# Patient Record
Sex: Female | Born: 1965
Health system: Southern US, Community
[De-identification: ages and names within clinical notes are randomized; demographics above are authoritative.]

## PROBLEM LIST (undated history)

## (undated) DIAGNOSIS — M1612 Unilateral primary osteoarthritis, left hip: Secondary | ICD-10-CM

## (undated) DIAGNOSIS — K5792 Diverticulitis of intestine, part unspecified, without perforation or abscess without bleeding: Secondary | ICD-10-CM

## (undated) DIAGNOSIS — I10 Essential (primary) hypertension: Secondary | ICD-10-CM

## (undated) DIAGNOSIS — E559 Vitamin D deficiency, unspecified: Secondary | ICD-10-CM

## (undated) DIAGNOSIS — F329 Major depressive disorder, single episode, unspecified: Secondary | ICD-10-CM

## (undated) DIAGNOSIS — K829 Disease of gallbladder, unspecified: Secondary | ICD-10-CM

## (undated) DIAGNOSIS — E739 Lactose intolerance, unspecified: Secondary | ICD-10-CM

## (undated) DIAGNOSIS — I82409 Acute embolism and thrombosis of unspecified deep veins of unspecified lower extremity: Secondary | ICD-10-CM

## (undated) DIAGNOSIS — I280 Arteriovenous fistula of pulmonary vessels: Secondary | ICD-10-CM

## (undated) DIAGNOSIS — E662 Morbid (severe) obesity with alveolar hypoventilation: Secondary | ICD-10-CM

## (undated) DIAGNOSIS — E119 Type 2 diabetes mellitus without complications: Secondary | ICD-10-CM

## (undated) DIAGNOSIS — F32A Depression, unspecified: Secondary | ICD-10-CM

## (undated) DIAGNOSIS — N83209 Unspecified ovarian cyst, unspecified side: Secondary | ICD-10-CM

## (undated) DIAGNOSIS — K429 Umbilical hernia without obstruction or gangrene: Secondary | ICD-10-CM

## (undated) DIAGNOSIS — D3500 Benign neoplasm of unspecified adrenal gland: Secondary | ICD-10-CM

## (undated) DIAGNOSIS — M5416 Radiculopathy, lumbar region: Secondary | ICD-10-CM

## (undated) DIAGNOSIS — Z780 Asymptomatic menopausal state: Secondary | ICD-10-CM

## (undated) DIAGNOSIS — M4316 Spondylolisthesis, lumbar region: Secondary | ICD-10-CM

## (undated) DIAGNOSIS — Q2730 Arteriovenous malformation, site unspecified: Secondary | ICD-10-CM

## (undated) DIAGNOSIS — K760 Fatty (change of) liver, not elsewhere classified: Secondary | ICD-10-CM

## (undated) DIAGNOSIS — G4730 Sleep apnea, unspecified: Secondary | ICD-10-CM

## (undated) DIAGNOSIS — E66813 Obesity, class 3: Secondary | ICD-10-CM

## (undated) DIAGNOSIS — N9412 Deep dyspareunia: Secondary | ICD-10-CM

## (undated) DIAGNOSIS — D649 Anemia, unspecified: Secondary | ICD-10-CM

## (undated) DIAGNOSIS — R7303 Prediabetes: Secondary | ICD-10-CM

## (undated) DIAGNOSIS — G8929 Other chronic pain: Secondary | ICD-10-CM

## (undated) DIAGNOSIS — D569 Thalassemia, unspecified: Secondary | ICD-10-CM

## (undated) DIAGNOSIS — Z6841 Body Mass Index (BMI) 40.0 and over, adult: Secondary | ICD-10-CM

## (undated) HISTORY — PX: LEEP: SHX91

## (undated) HISTORY — DX: Arteriovenous malformation, site unspecified: Q27.30

## (undated) HISTORY — DX: Fatty (change of) liver, not elsewhere classified: K76.0

## (undated) HISTORY — DX: Vitamin D deficiency, unspecified: E55.9

## (undated) HISTORY — DX: Prediabetes: R73.03

## (undated) HISTORY — DX: Type 2 diabetes mellitus without complications: E11.9

## (undated) HISTORY — DX: Morbid (severe) obesity due to excess calories: E66.01

## (undated) HISTORY — DX: Lactose intolerance, unspecified: E73.9

## (undated) HISTORY — DX: Thalassemia, unspecified: D56.9

## (undated) HISTORY — DX: Diverticulitis of intestine, part unspecified, without perforation or abscess without bleeding: K57.92

## (undated) HISTORY — DX: Benign neoplasm of unspecified adrenal gland: D35.00

## (undated) HISTORY — DX: Disease of gallbladder, unspecified: K82.9

## (undated) HISTORY — PX: DILATION AND CURETTAGE OF UTERUS: SHX78

## (undated) HISTORY — DX: Acute embolism and thrombosis of unspecified deep veins of unspecified lower extremity: I82.409

## (undated) HISTORY — DX: Anemia, unspecified: D64.9

## (undated) HISTORY — PX: ABDOMINAL HYSTERECTOMY: SHX81

## (undated) HISTORY — PX: APPENDECTOMY: SHX54

## (undated) HISTORY — PX: TUBAL LIGATION: SHX77

## (undated) HISTORY — DX: Depression, unspecified: F32.A

## (undated) HISTORY — PX: CHOLECYSTECTOMY: SHX55

## (undated) NOTE — *Deleted (*Deleted)
[facility]   [phone] [phone]   Clinic Follow up Note   Patient Care Team: Laqueta Due., MD as PCP - General (Internal Medicine) Rachael Fee, MD as Attending Physician (Gastroenterology) Zelphia Cairo, MD as Consulting Physician (Obstetrics and Gynecology) Rene Paci, MD as Consulting Physician (Urology) Helane Rima, DO (Family Medicine)  Date of Service:  04/11/2020  CHIEF COMPLAINT: F/u of High Ferritin   CURRENT THERAPY:  ***  INTERVAL HISTORY: *** Diana Schultz is here for a follow up. She was last seen by me 6 months ago. She presents to the clinic alone.    REVIEW OF SYSTEMS:  *** Constitutional: Denies fevers, chills or abnormal weight loss Eyes: Denies blurriness of vision Ears, nose, mouth, throat, and face: Denies mucositis or sore throat Respiratory: Denies cough, dyspnea or wheezes Cardiovascular: Denies palpitation, chest discomfort or lower extremity swelling Gastrointestinal:  Denies nausea, heartburn or change in bowel habits Skin: Denies abnormal skin rashes Lymphatics: Denies new lymphadenopathy or easy bruising Neurological:Denies numbness, tingling or new weaknesses Behavioral/Psych: Mood is stable, no new changes  All other systems were reviewed with the patient and are negative.  MEDICAL HISTORY:  Past Medical History:  Diagnosis Date  . Arteriovenous malformation   . Depression   . Diabetes mellitus without complication (HCC)    borderline - no meds  . Diverticulitis   . DVT (deep venous thrombosis) (HCC)    Hx at age 73 blood clot in right leg, d/c birth control pills and no other problems  . Fatty liver   . Gallbladder disease   . Hypertension    history,lost weight, controlled with diet and exercise, no current med  . Lactose intolerance   . Ovarian cyst   . Thalassemia   . Umbilical hernia   . Vaginal delivery 1984, 1989  . Vitamin D deficiency     SURGICAL  HISTORY: Past Surgical History:  Procedure Laterality Date  . ABDOMINAL HYSTERECTOMY    . ABLATION  03/23/13   [facility]  . APPENDECTOMY    . CHOLECYSTECTOMY    . DILATION AND CURETTAGE OF UTERUS    . DILATION AND CURETTAGE OF UTERUS N/A 11/01/2013   Procedure: DILATATION AND CURETTAGE;  Surgeon: Catalina Antigua, MD;  Location: [facility];  Service: Gynecology;  Laterality: N/A;  . DILITATION & CURRETTAGE/HYSTROSCOPY WITH NOVASURE ABLATION N/A 03/23/2013   Procedure: DILATATION & CURETTAGE/HYSTEROSCOPY WITH NOVASURE ABLATION;  Surgeon: Willodean Rosenthal, MD;  Location: [facility];  Service: Gynecology;  Laterality: N/A;  . LEEP    . TUBAL LIGATION    . VAGINAL HYSTERECTOMY N/A 12/31/2013   Procedure: HYSTERECTOMY VAGINAL;  Surgeon: Willodean Rosenthal, MD;  Location: [facility];  Service: Gynecology;  Laterality: N/A;    I have reviewed the social history and family history with the patient and they are unchanged from previous note.  ALLERGIES:  is allergic to amlodipine, metoprolol, benicar [olmesartan], hydrochlorothiazide, lactose intolerance (gi), lisinopril, nebivolol, and semaglutide(0.25 or 0.5mg -dos).  MEDICATIONS:  Current Outpatient Medications  Medication Sig Dispense Refill  . acetaminophen (TYLENOL) 500 MG tablet Take 500 mg by mouth every 6 (six) hours as needed for moderate pain.    . Melatonin 2.5 MG CAPS Take 1 capsule by mouth at bedtime.     No current facility-administered medications for this visit.    PHYSICAL EXAMINATION: ECOG PERFORMANCE STATUS: {CHL ONC ECOG PS:[id number]}  There were no vitals filed for this visit. There were no vitals filed for this visit. ***  GENERAL:alert, no distress and comfortable SKIN: skin color, texture, turgor are normal, no rashes or significant lesions EYES: normal, Conjunctiva are pink and non-injected, sclera clear {OROPHARYNX:no exudate, no erythema and lips, buccal mucosa, and tongue normal}  NECK: supple, thyroid normal size,  non-tender, without nodularity LYMPH:  no palpable lymphadenopathy in the cervical, axillary {or inguinal} LUNGS: clear to auscultation and percussion with normal breathing effort HEART: regular rate & rhythm and no murmurs and no lower extremity edema ABDOMEN:abdomen soft, non-tender and normal bowel sounds Musculoskeletal:no cyanosis of digits and no clubbing  NEURO: alert & oriented x 3 with fluent speech, no focal motor/sensory deficits  LABORATORY DATA:  I have reviewed the data as listed CBC Latest Ref Rng & Units 09/11/2019 06/10/2019 12/15/2017  WBC 3.4 - 10.8 x10E3/uL 9.1 9.4 10.5  Hemoglobin 11.1 - 15.9 g/dL 16.1 09.6 04.5  Hematocrit 34.0 - 46.6 % 42.8 44.7 41.6  Platelets 150 - 450 x10E3/uL 242 233 238     CMP Latest Ref Rng & Units 09/11/2019 06/10/2019 12/15/2017  Glucose 65 - 99 mg/dL 409(W) 95 96  BUN 6 - 24 mg/dL 18 17 16   Creatinine 0.57 - 1.00 mg/dL 1.19 1.47 8.29  Sodium 134 - 144 mmol/L 138 139 142  Potassium 3.5 - 5.2 mmol/L 4.2 4.8 4.2  Chloride 96 - 106 mmol/L 101 106 108  CO2 20 - 29 mmol/L 24 25 26   Calcium 8.7 - 10.2 mg/dL 9.4 9.5 9.3  Total Protein 6.0 - 8.5 g/dL 7.3 - 7.6  Total Bilirubin 0.0 - 1.2 mg/dL 0.7 - 1.1  Alkaline Phos 39 - 117 IU/L 83 - 72  AST 0 - 40 IU/L 20 - 26  ALT 0 - 32 IU/L 22 - 22      RADIOGRAPHIC STUDIES: I have personally reviewed the radiological images as listed and agreed with the findings in the report. No results found.   ASSESSMENT & PLAN:  NA Diana Schultz is a 67 y.o. female with    1. Elevated Ferritin, Microcytosis  -Per pt she had a h/o anemia and was on oral iron pill many years ago. Her oral iron was later on d/c due to elevated levels.  -her CBC since 2013 showed persistent mild microcytosis, no anemia or other abnormal blood counts. -In 03/2018 at [facility] she had Ferritin at 267, MCV 76.9, MCH 24.2. There was suspicion for her being a Thalassemia Carrier. -Her more recent labs show Ferritin increased to 363 on  09/11/19 with MCV 77, MCH 25.  -I recommend genetic testing for thalassemia as she may be a carrier and can effect her children. She is agreeable. -I discussed ferritin level can reflect iron storage, but high level could also be related to chronic inflammation, such as rheumatological disorder or infection.  She has no obvious clinical presentation of rheumatological disorder or infection, I will check ANA and inflammation markers today.  -Given the normal serum iron and transferrin saturation, my suspicion of iron overload and hemochromatosis is low, I will check HFE gene mutation today.  Her CT abdomen and pelvis with contrast in June 2019 showed unremarkable liver and spleen.  I do not feel she needs liver MRI or biopsy at this point.  -She did have epigastric tenderness on exam today. She has been having Epigastric pain which is exacerbated by eating, will monitor   2. Obesity, Fatigue  -She has been working with Dr. Earlene Plater on weight management. She has been altering her diet and exercising without success. She is  considering medical options with Dr Earlene Plater -Her fatigue is significant. She notes it could be related to her weigh or her mood. Will monitor.    3. Comorbidities: DM, HTN, H/o right leg DVT.  -not currently on medications. She is working on controlling with diet and exercise with Dr Earlene Plater.    4. Adrenal Nodule  -Seen on 06/13/19 CT AP at [facility]. This is likely benign and she will continue to f/u with [facility].    PLAN:  -Lab today for hemoglobin electrophoresis, hemochromatosis DNA, ANA and sedimentation rate, I will call her with lab result  -Lab and F/u in 6 months    No problem-specific Assessment & Plan notes found for this encounter.   No orders of the defined types were placed in this encounter.  All questions were answered. The patient knows to call the clinic with any problems, questions or concerns. No barriers to learning was detected.  The total time spent in the appointment was {CHL ONC TIME VISIT - UEAVW:[id number]}.     Delphina Cahill 04/11/2020   Rogelia Rohrer, am acting as scribe for Malachy Mood, MD.   {Add scribe attestation statement}

---
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
## 2008-03-27 ENCOUNTER — Emergency Department (HOSPITAL_COMMUNITY): Admission: EM | Admit: 2008-03-27 | Discharge: 2008-03-27 | Payer: Self-pay | Admitting: Emergency Medicine

## 2008-08-06 ENCOUNTER — Ambulatory Visit: Payer: Self-pay | Admitting: Cardiology

## 2010-12-15 LAB — HM PAP SMEAR: HM PAP: NORMAL

## 2011-03-23 LAB — URINALYSIS, ROUTINE W REFLEX MICROSCOPIC
Bilirubin Urine: NEGATIVE
Glucose, UA: NEGATIVE
Hgb urine dipstick: NEGATIVE
Nitrite: NEGATIVE
Specific Gravity, Urine: 1.02
pH: 6.5

## 2011-03-23 LAB — PREGNANCY, URINE: Preg Test, Ur: NEGATIVE

## 2011-06-18 ENCOUNTER — Emergency Department (HOSPITAL_COMMUNITY)
Admission: EM | Admit: 2011-06-18 | Discharge: 2011-06-18 | Disposition: A | Discharge status: Home / Self Care | Payer: Self-pay | Attending: Emergency Medicine | Admitting: Emergency Medicine

## 2011-06-18 ENCOUNTER — Encounter: Payer: Self-pay | Admitting: Emergency Medicine

## 2011-06-18 DIAGNOSIS — M79609 Pain in unspecified limb: Secondary | ICD-10-CM

## 2011-06-18 DIAGNOSIS — I10 Essential (primary) hypertension: Secondary | ICD-10-CM | POA: Insufficient documentation

## 2011-06-18 DIAGNOSIS — M79661 Pain in right lower leg: Secondary | ICD-10-CM

## 2011-06-18 HISTORY — DX: Essential (primary) hypertension: I10

## 2011-06-18 MED ORDER — IBUPROFEN 800 MG PO TABS
800.0000 mg | ORAL_TABLET | Freq: Once | ORAL | Status: AC
  Administered 2011-06-18: 800 mg via ORAL
  Filled 2011-06-18: qty 1

## 2011-06-18 NOTE — ED Notes (Signed)
Vascular at bedside performing doppler of right lower extremity.

## 2011-06-18 NOTE — ED Provider Notes (Signed)
History     CSN: [id number]  Arrival date & time 06/18/11  0409   First MD Initiated Contact with Patient 06/18/11 [phone]      Chief Complaint  Patient presents with  . Leg Pain    (Consider location/radiation/quality/duration/timing/severity/associated sxs/prior treatment) HPI Patient is a 45 year old female who presents today complaining of right lower extremity pain that began in her calf and is now radiating up to her hip. Patient was concerned she did have a blood clot. She has history of this in 1988. At that time DVT was thought to be secondary to use of oral contraceptives. Patient is no longer taking these. She has been on no recent long trips and does not smoke. Patient said her pain began 2 days ago. It woke her from sleep at that time. It is persisted in tonight was too uncomfortable for her to her home. Patient denies any other symptoms including changes in her skin, nausea, vomiting, or fevers. Patient no chest pain or shortness of breath. She's not on any anticoagulation at this time. She says the pain is a 6/10. It is made worse with walking and better she sits still. There no other associated or modifying factors.  Past Medical History  Diagnosis Date  . Hypertension     Past Surgical History  Procedure Date  . Tubal ligation   . Cholecystectomy     History reviewed. No pertinent family history.  History  Substance Use Topics  . Smoking status: Not on file  . Smokeless tobacco: Not on file  . Alcohol Use:     OB History    Grav Para Term Preterm Abortions TAB SAB Ect Mult Living                  Review of Systems  Constitutional: Negative.   HENT: Negative.   Eyes: Negative.   Respiratory: Negative.   Cardiovascular: Negative.   Gastrointestinal: Negative.   Genitourinary: Negative.   Musculoskeletal:       Right calf pain  Skin: Negative.   Neurological: Negative.   Hematological: Negative.   Psychiatric/Behavioral: Negative.   All other  systems reviewed and are negative.    Allergies  Lisinopril  Home Medications   Current Outpatient Rx  Name Route Sig Dispense Refill  . AMLODIPINE BESYLATE 10 MG PO TABS Oral Take 10 mg by mouth daily.      Marland Kitchen CALCIUM CARBONATE-VITAMIN D 500-200 MG-UNIT PO TABS Oral Take 1 tablet by mouth daily.      . FE BISGLYCINATE-FE POLYSAC 40-20 MG PO CAPS Oral Take 1 capsule by mouth 3 (three) times daily with meals.      Marland Kitchen RANITIDINE HCL 150 MG PO TABS Oral Take 150 mg by mouth 2 (two) times daily.      . VENLAFAXINE HCL 75 MG PO TABS Oral Take 75 mg by mouth 2 (two) times daily.        BP 125/78  Pulse 63  Temp(Src) 97.6 F (36.4 C) (Oral)  Resp 18  Wt 265 lb (120.203 kg)  SpO2 98%  LMP 05/29/2011  Physical Exam  Nursing note and vitals reviewed. Constitutional: She is oriented to person, place, and time. She appears well-developed and well-nourished. No distress.  HENT:  Head: Normocephalic and atraumatic.  Eyes: Conjunctivae and EOM are normal. Pupils are equal, round, and reactive to light.  Neck: Normal range of motion.  Cardiovascular: Normal rate.   Pulmonary/Chest: Effort normal.  Musculoskeletal:  Tenderness to palpation over the right calf. There is no cord on palpation. Pulses are 2+ in the posterior tibialis and dorsalis pedis. There are no skin changes overlying the affected area. There is no significant temperature difference between the bilateral lower charities.  Neurological: She is alert and oriented to person, place, and time.  Skin: Skin is warm and dry. No rash noted.  Psychiatric: She has a normal mood and affect.    ED Course  Procedures (including critical care time)  Labs Reviewed - No data to display No results found.   1. Pain of right lower leg       MDM  Patient presented with right lower extremity pain with history of DVT. She did not have any physical exam findings concerning for cellulitis over the affected area. There is no concern  for arterial obstruction on my exam. Lower extremity doppler was ordered. Patient was given ibuprofen for pain. Scan is pending at this time. Skin was remarkable for sluggish flow in the right popliteal artery only. Patient was notified of results. She did not want anything for pain. She was told she can followup with her primary care physician. She is to return if she has any worsening of her symptoms or other emergent concerns.  Cyndra Numbers, MD 06/18/11 [phone]

## 2011-06-18 NOTE — ED Notes (Signed)
Pt alert, nad, c/o right calf pain, onset a few days ago, denies trauma or injury, ambulates to room, pms intact

## 2011-06-18 NOTE — ED Notes (Signed)
Pt asked prior to discharge if member of management spoke with her as requested. Per pt, she has card with information on it and she prefers just to call back to discuss issues with wait times with staff at a later time.

## 2011-06-18 NOTE — ED Notes (Signed)
Strong right pedal pulse present; full ROM present in extremity

## 2011-06-18 NOTE — Progress Notes (Signed)
VASCULAR LAB PRELIMINARY  PRELIMINARY  PRELIMINARY  PRELIMINARY  Right lower extremity venous duplex  completed.    Preliminary report:  Right:  No evidence of DVT, superficial thrombosis, or Baker's cyst. Sluggish flow noted in the proximal popliteal vein.   Terance Hart, RVT 06/18/2011, 8:48 AM

## 2012-01-24 ENCOUNTER — Emergency Department (HOSPITAL_COMMUNITY)
Admission: EM | Admit: 2012-01-24 | Discharge: 2012-01-25 | Disposition: A | Discharge status: Home / Self Care | Payer: PRIVATE HEALTH INSURANCE | Attending: Emergency Medicine | Admitting: Emergency Medicine

## 2012-01-24 ENCOUNTER — Encounter (HOSPITAL_COMMUNITY): Payer: Self-pay | Admitting: *Deleted

## 2012-01-24 DIAGNOSIS — Z9089 Acquired absence of other organs: Secondary | ICD-10-CM | POA: Insufficient documentation

## 2012-01-24 DIAGNOSIS — R109 Unspecified abdominal pain: Secondary | ICD-10-CM

## 2012-01-24 DIAGNOSIS — Z79899 Other long term (current) drug therapy: Secondary | ICD-10-CM | POA: Insufficient documentation

## 2012-01-24 DIAGNOSIS — R1013 Epigastric pain: Secondary | ICD-10-CM | POA: Insufficient documentation

## 2012-01-24 DIAGNOSIS — I10 Essential (primary) hypertension: Secondary | ICD-10-CM | POA: Insufficient documentation

## 2012-01-24 LAB — COMPREHENSIVE METABOLIC PANEL
ALT: 20 U/L (ref 0–35)
AST: 15 U/L (ref 0–37)
CO2: 29 mEq/L (ref 19–32)
Chloride: 99 mEq/L (ref 96–112)
GFR calc non Af Amer: >90 mL/min (ref >90)
Sodium: 136 mEq/L (ref 135–145)
Total Bilirubin: 0.3 mg/dL (ref 0.3–1.2)

## 2012-01-24 LAB — URINALYSIS, ROUTINE W REFLEX MICROSCOPIC
Hgb urine dipstick: NEGATIVE
Specific Gravity, Urine: 1.015 (ref 1.005–1.030)
Urobilinogen, UA: 0.2 mg/dL (ref 0.0–1.0)
pH: 7.5 (ref 5.0–8.0)

## 2012-01-24 LAB — CBC WITH DIFFERENTIAL/PLATELET
Basophils Absolute: 0 10*3/uL (ref 0.0–0.1)
HCT: 36 % (ref 36.0–46.0)
Lymphocytes Relative: 25 % (ref 12–46)
Monocytes Absolute: 0.6 10*3/uL (ref 0.1–1.0)
Neutro Abs: 7.3 10*3/uL (ref 1.7–7.7)
Platelets: 227 10*3/uL (ref 150–400)
RDW: 15.6 % — ABNORMAL HIGH (ref 11.5–15.5)
WBC: 10.9 10*3/uL — ABNORMAL HIGH (ref 4.0–10.5)

## 2012-01-24 LAB — PREGNANCY, URINE: Preg Test, Ur: NEGATIVE

## 2012-01-24 MED ORDER — PANTOPRAZOLE SODIUM 40 MG IV SOLR
40.0000 mg | Freq: Once | INTRAVENOUS | Status: AC
  Administered 2012-01-25: 40 mg via INTRAVENOUS
  Filled 2012-01-24: qty 40

## 2012-01-24 MED ORDER — SUCRALFATE 1 G PO TABS
1.0000 g | ORAL_TABLET | Freq: Once | ORAL | Status: AC
  Administered 2012-01-25: 1 g via ORAL
  Filled 2012-01-24: qty 1

## 2012-01-24 NOTE — ED Notes (Signed)
Pt c/o upper abd pain radiating to back; nausea; no vomiting; pain today; worse last hour

## 2012-01-24 NOTE — ED Provider Notes (Signed)
History     CSN: [id number]  Arrival date & time 01/24/12  2018   First MD Initiated Contact with Patient 01/24/12 2327      Chief Complaint  Patient presents with  . Abdominal Pain    (Consider location/radiation/quality/duration/timing/severity/associated sxs/prior treatment) HPI Comments: Morbidly obese female with epigastric pain and nausea radiating to bilateral upper quadrants  Hx of GERD and Choly. Took ibuprofen and tums without relief   Patient is a 46 y.o. female presenting with abdominal pain. The history is provided by the patient.  Abdominal Pain The primary symptoms of the illness include abdominal pain and nausea. The primary symptoms of the illness do not include fever, fatigue, shortness of breath, vomiting or diarrhea. The current episode started 13 to 24 hours ago. The onset of the illness was gradual. The problem has not changed since onset. The abdominal pain began 13 to24 hours ago. The pain came on gradually. The abdominal pain has been unchanged since its onset. The abdominal pain is located in the epigastric region. The abdominal pain radiates to the RLQ and LUQ. The severity of the abdominal pain is 7/10. The abdominal pain is relieved by nothing. The abdominal pain is exacerbated by vomiting.  Nausea began today.  The patient states that she believes she is currently not pregnant. The patient has not had a change in bowel habit. Symptoms associated with the illness do not include constipation or back pain. Significant associated medical issues include GERD and diabetes.    Past Medical History  Diagnosis Date  . Hypertension     Past Surgical History  Procedure Date  . Tubal ligation   . Cholecystectomy   . Appendectomy     No family history on file.  History  Substance Use Topics  . Smoking status: Never Smoker   . Smokeless tobacco: Not on file  . Alcohol Use:     OB History    Grav Para Term Preterm Abortions TAB SAB Ect Mult Living            Review of Systems  Constitutional: Negative for fever and fatigue.  Respiratory: Negative for chest tightness and shortness of breath.   Gastrointestinal: Positive for nausea and abdominal pain. Negative for vomiting, diarrhea, constipation, blood in stool, abdominal distention and rectal pain.  Musculoskeletal: Negative for back pain.  Skin: Negative for rash and wound.  Neurological: Negative for dizziness and headaches.    Allergies  Lisinopril  Home Medications   Current Outpatient Rx  Name Route Sig Dispense Refill  . METFORMIN HCL 500 MG PO TABS Oral Take 500 mg by mouth 2 (two) times daily with a meal.    . PRE-NATAL PO Oral Take 1 tablet by mouth daily.    Marland Kitchen RABEPRAZOLE SODIUM 20 MG PO TBEC Oral Take 20 mg by mouth daily.    Marland Kitchen RISPERIDONE 2 MG PO TABS Oral Take 2 mg by mouth 2 (two) times daily.    . TELMISARTAN-HCTZ 40-12.5 MG PO TABS Oral Take 1 tablet by mouth daily.    . VENLAFAXINE HCL ER 150 MG PO CP24 Oral Take 150 mg by mouth daily.    Marland Kitchen HYDROCODONE-ACETAMINOPHEN 5-325 MG PO TABS Oral Take 1 tablet by mouth once. 20 tablet 0  . SUCRALFATE 1 G PO TABS Oral Take 1 tablet (1 g total) by mouth once. 60 tablet 0    BP 128/83  Pulse 82  Temp 97.8 F (36.6 C) (Oral)  Resp 18  SpO2 96%  LMP  11/26/2011  Physical Exam  Constitutional: She is oriented to person, place, and time. She appears well-developed.  HENT:  Head: Normocephalic.  Neck: Normal range of motion.  Cardiovascular: Normal rate.   Pulmonary/Chest: Effort normal.  Abdominal: Soft. There is tenderness in the right upper quadrant, epigastric area and left upper quadrant.  Musculoskeletal: Normal range of motion.  Neurological: She is alert and oriented to person, place, and time.  Skin: Skin is warm and dry. No rash noted.    ED Course  Procedures (including critical care time)  Labs Reviewed  URINALYSIS, ROUTINE W REFLEX MICROSCOPIC - Abnormal; Notable for the following:     APPearance CLOUDY (*)     All other components within normal limits  CBC WITH DIFFERENTIAL - Abnormal; Notable for the following:    WBC 10.9 (*)     MCV 75.9 (*)     MCH 25.5 (*)     RDW 15.6 (*)     All other components within normal limits  COMPREHENSIVE METABOLIC PANEL - Abnormal; Notable for the following:    Glucose, Bld 145 (*)     Albumin 3.3 (*)     All other components within normal limits  PREGNANCY, URINE  LIPASE, BLOOD   Ct Abdomen Pelvis W Contrast  01/25/2012  *RADIOLOGY REPORT*  Clinical Data: Upper abdominal pain.  White cell count 10.9.  CT ABDOMEN AND PELVIS WITH CONTRAST  Technique:  Multidetector CT imaging of the abdomen and pelvis was performed following the standard protocol during bolus administration of intravenous contrast.  Contrast: OMNIPAQUE IOHEXOL 300 MG/ML  SOLN  Comparison: 12/18/2010  Findings: The lung bases are clear.  Surgical absence of the gallbladder.  Focal fatty infiltration adjacent to the falciform ligament of the liver.  The spleen, pancreas, adrenal glands, abdominal aorta, and retroperitoneal lymph nodes are unremarkable.  Parapelvic cysts in the kidneys.  No evidence of solid mass or hydronephrosis.  The stomach, small bowel, and colon are not abnormally distended.  Small umbilical hernia containing fat.  No free air or free fluid in the abdomen.  Pelvis:  Uterus and adnexal structures are not enlarged.  Bladder wall is not thickened.  No free or loculated pelvic fluid collections.  No significant pelvic lymphadenopathy.  Surgical absence of the appendix.  No evidence of diverticulitis.  Abdominal wall appears intact.  Degenerative changes in the spine.  No significant change since previous study.  IMPRESSION: No acute process demonstrated in the abdomen or pelvis.  Focal fatty infiltration in the liver.  Original Report Authenticated By: Marlon Pel, M.D.     1. Abdominal pain       MDM  Reviewed labs- normal will treat pain  Patient has a PCP she can FU with as needed CT as above, reveals no acute process in the abdomen.  Labs were reviewed all within normal limits except a slightly elevated glucose of 145 and albumin of 3.3.  She has been given Carafate, Protonix, and Vicodin, not recommended.  She follow up with her primary care provider          Arman Filter, NP 01/25/12 [phone]

## 2012-01-25 ENCOUNTER — Emergency Department (HOSPITAL_COMMUNITY): Payer: PRIVATE HEALTH INSURANCE

## 2012-01-25 MED ORDER — HYDROCODONE-ACETAMINOPHEN 5-325 MG PO TABS: 1.0000 | ORAL_TABLET | Freq: Once | ORAL | Status: AC

## 2012-01-25 MED ORDER — IOHEXOL 300 MG/ML  SOLN
100.0000 mL | Freq: Once | INTRAMUSCULAR | Status: AC | PRN
  Administered 2012-01-25: 100 mL via INTRAVENOUS

## 2012-01-25 MED ORDER — HYDROMORPHONE HCL PF 1 MG/ML IJ SOLN
1.0000 mg | Freq: Once | INTRAMUSCULAR | Status: AC
  Administered 2012-01-25: 1 mg via INTRAVENOUS
  Filled 2012-01-25: qty 1

## 2012-01-25 MED ORDER — SUCRALFATE 1 G PO TABS: 1.0000 g | ORAL_TABLET | Freq: Once | ORAL | Status: DC

## 2012-01-25 MED ORDER — ONDANSETRON HCL 4 MG/2ML IJ SOLN
4.0000 mg | Freq: Once | INTRAMUSCULAR | Status: AC
  Administered 2012-01-25: 4 mg via INTRAVENOUS
  Filled 2012-01-25: qty 2

## 2012-01-25 MED ORDER — HYDROCODONE-ACETAMINOPHEN 5-325 MG PO TABS
1.0000 | ORAL_TABLET | Freq: Once | ORAL | Status: AC
  Administered 2012-01-25: 1 via ORAL
  Filled 2012-01-25: qty 1

## 2012-01-25 NOTE — ED Provider Notes (Signed)
Medical screening examination/treatment/procedure(s) were performed by non-physician practitioner and as supervising physician I was immediately available for consultation/collaboration.   Hanley Seamen, MD 01/25/12 [phone]

## 2012-01-25 NOTE — ED Notes (Signed)
Pt given CT contrast.

## 2012-03-22 ENCOUNTER — Emergency Department (HOSPITAL_COMMUNITY): Payer: No Typology Code available for payment source

## 2012-03-22 ENCOUNTER — Emergency Department (HOSPITAL_COMMUNITY)
Admission: EM | Admit: 2012-03-22 | Discharge: 2012-03-22 | Disposition: A | Discharge status: Home / Self Care | Payer: No Typology Code available for payment source | Attending: Emergency Medicine | Admitting: Emergency Medicine

## 2012-03-22 ENCOUNTER — Encounter (HOSPITAL_COMMUNITY): Payer: Self-pay | Admitting: Family Medicine

## 2012-03-22 DIAGNOSIS — I10 Essential (primary) hypertension: Secondary | ICD-10-CM | POA: Insufficient documentation

## 2012-03-22 DIAGNOSIS — Z888 Allergy status to other drugs, medicaments and biological substances status: Secondary | ICD-10-CM | POA: Insufficient documentation

## 2012-03-22 DIAGNOSIS — J40 Bronchitis, not specified as acute or chronic: Secondary | ICD-10-CM | POA: Insufficient documentation

## 2012-03-22 DIAGNOSIS — Z79899 Other long term (current) drug therapy: Secondary | ICD-10-CM | POA: Insufficient documentation

## 2012-03-22 LAB — URINALYSIS, ROUTINE W REFLEX MICROSCOPIC
Glucose, UA: NEGATIVE mg/dL
Hgb urine dipstick: NEGATIVE
Ketones, ur: NEGATIVE mg/dL
Protein, ur: NEGATIVE mg/dL

## 2012-03-22 LAB — URINE MICROSCOPIC-ADD ON

## 2012-03-22 MED ORDER — PREDNISONE (PAK) 10 MG PO TABS: 10.0000 mg | ORAL_TABLET | Freq: Every day | ORAL | Status: DC

## 2012-03-22 MED ORDER — ALBUTEROL SULFATE HFA 108 (90 BASE) MCG/ACT IN AERS: 1.0000 | INHALATION_SPRAY | Freq: Four times a day (QID) | RESPIRATORY_TRACT | Status: DC | PRN

## 2012-03-22 NOTE — ED Provider Notes (Signed)
History     CSN: [id number]  Arrival date & time 03/22/12  1623   First MD Initiated Contact with Patient 03/22/12 1725      Chief Complaint  Patient presents with  . Cough    (Consider location/radiation/quality/duration/timing/severity/associated sxs/prior treatment) HPI Comments: Patient reports she has had a cough for the past 4 weeks.  Cough is dry, worse at night.  States she has taken amoxicillin and a z-pak, was on tessalon perles and now hycodan syrup without relief.  States she has been coughing so much she has had bladder incontinence and has since developed dysuria.  This all began 4 weeks ago after she was seen at her PCP [facility] clinic) and was given a flu shot and had her effexor and risperdol discontinued without taper, started on wellbutrin and topamax.  States she has also been feeling very anxious since this time, especially at night.  Was also seen recently at [facility] clinic for same symptoms.  Denies fevers, CP, SOB, sore throat, abdominal pain.    Patient is a 46 y.o. female presenting with cough. The history is provided by the patient.  Cough Associated symptoms include headaches. Pertinent negatives include no chest pain, no chills, no sore throat and no shortness of breath.    Past Medical History  Diagnosis Date  . Hypertension     Past Surgical History  Procedure Date  . Tubal ligation   . Cholecystectomy   . Appendectomy     History reviewed. No pertinent family history.  History  Substance Use Topics  . Smoking status: Never Smoker   . Smokeless tobacco: Not on file  . Alcohol Use: No    OB History    Grav Para Term Preterm Abortions TAB SAB Ect Mult Living                  Review of Systems  Constitutional: Negative for fever and chills.  HENT: Negative for sore throat and trouble swallowing.   Respiratory: Positive for cough. Negative for shortness of breath.   Cardiovascular: Negative for chest pain.  Gastrointestinal: Negative for  abdominal pain.  Genitourinary: Positive for dysuria.  Neurological: Positive for headaches.    Allergies  Lisinopril  Home Medications   Current Outpatient Rx  Name Route Sig Dispense Refill  . BUPROPION HCL ER (XL) 150 MG PO TB24 Oral Take 150 mg by mouth daily.    Marland Kitchen HYDROCODONE-HOMATROPINE 5-1.5 MG/5ML PO SYRP Oral Take 5 mLs by mouth every 6 (six) hours as needed. Cough    . METFORMIN HCL 500 MG PO TABS Oral Take 500 mg by mouth 2 (two) times daily with a meal.    . RABEPRAZOLE SODIUM 20 MG PO TBEC Oral Take 20 mg by mouth daily.    . TOPIRAMATE 100 MG PO TABS Oral Take 100 mg by mouth daily.    Marland Kitchen BENZONATATE 100 MG PO CAPS Oral Take 100 mg by mouth 2 (two) times daily as needed.      BP 150/99  Pulse 93  Temp 98.2 F (36.8 C) (Oral)  Resp 22  SpO2 99%  LMP 12/21/2011  Physical Exam  Nursing note and vitals reviewed. Constitutional: She appears well-developed and well-nourished. No distress.  HENT:  Head: Normocephalic and atraumatic.  Mouth/Throat: Oropharynx is clear and moist. No oropharyngeal exudate.  Eyes: Conjunctivae normal are normal.  Neck: Normal range of motion. Neck supple.  Cardiovascular: Normal rate and regular rhythm.   Pulmonary/Chest: Effort normal and breath sounds normal.  No respiratory distress. She has no wheezes. She has no rales. She exhibits no tenderness.  Lymphadenopathy:    She has no cervical adenopathy.  Neurological: She is alert.  Skin: She is not diaphoretic.    ED Course  Procedures (including critical care time)  Labs Reviewed  URINALYSIS, ROUTINE W REFLEX MICROSCOPIC - Abnormal; Notable for the following:    APPearance TURBID (*)     Specific Gravity, Urine 1.031 (*)     Bilirubin Urine SMALL (*)     All other components within normal limits  URINE MICROSCOPIC-ADD ON   Dg Chest 2 View  03/22/2012  *RADIOLOGY REPORT*  Clinical Data: Cough  CHEST - 2 VIEW  Comparison: C T 12/17/2010 and chest x-rays 12/17/2010  Findings:  Cardiomediastinal silhouette is stable.  No acute infiltrate or pleural effusion.  No pulmonary edema.  Bony thorax is stable.  IMPRESSION: No active disease.   Original Report Authenticated By: Natasha Mead, M.D.      1. Bronchitis     MDM  Pt with four weeks of cough.  Pt is coughing though lungs are clear.  Suspect bronchitis.  Anxiety may be related to abrupt switching of psych medications without taper.  Pt also hypertensive - notes PCP has been trying different HTN medications with her and she is currently between medications.  CXR negative.  Pt d/c home with prednisone, albuterol, PCP follow up.  Discussed all results with patient.  Pt given return precautions.  Pt verbalizes understanding and agrees with plan.           [place], [place] 03/23/12 [phone]

## 2012-03-22 NOTE — ED Notes (Signed)
Pt states she has had cough x 4 weeks. Has been on abx and cough syrup with no relief. Reports cough dry, worse at night. Denies fevers or sore throat.

## 2012-03-22 NOTE — ED Notes (Signed)
PA at bedside Pt alert and oriented x4. Respirations even and unlabored, bilateral symmetrical rise and fall of chest. Skin warm and dry. In no acute distress. Denies needs.   

## 2012-03-24 NOTE — ED Provider Notes (Signed)
Medical screening examination/treatment/procedure(s) were performed by non-physician practitioner and as supervising physician I was immediately available for consultation/collaboration.   Orville Widmann M Isacc Turney, MD 03/24/12 1754 

## 2013-02-23 ENCOUNTER — Emergency Department (HOSPITAL_COMMUNITY): Payer: PRIVATE HEALTH INSURANCE

## 2013-02-23 ENCOUNTER — Encounter (HOSPITAL_COMMUNITY): Payer: Self-pay | Admitting: Emergency Medicine

## 2013-02-23 ENCOUNTER — Emergency Department (HOSPITAL_COMMUNITY): Payer: Self-pay

## 2013-02-23 ENCOUNTER — Emergency Department (HOSPITAL_COMMUNITY)
Admission: EM | Admit: 2013-02-23 | Discharge: 2013-02-24 | Disposition: A | Discharge status: Home / Self Care | Payer: Self-pay | Attending: Emergency Medicine | Admitting: Emergency Medicine

## 2013-02-23 DIAGNOSIS — I10 Essential (primary) hypertension: Secondary | ICD-10-CM | POA: Insufficient documentation

## 2013-02-23 DIAGNOSIS — R102 Pelvic and perineal pain: Secondary | ICD-10-CM

## 2013-02-23 DIAGNOSIS — N939 Abnormal uterine and vaginal bleeding, unspecified: Secondary | ICD-10-CM

## 2013-02-23 DIAGNOSIS — R110 Nausea: Secondary | ICD-10-CM

## 2013-02-23 DIAGNOSIS — N949 Unspecified condition associated with female genital organs and menstrual cycle: Secondary | ICD-10-CM | POA: Insufficient documentation

## 2013-02-23 DIAGNOSIS — N898 Other specified noninflammatory disorders of vagina: Secondary | ICD-10-CM | POA: Insufficient documentation

## 2013-02-23 DIAGNOSIS — R350 Frequency of micturition: Secondary | ICD-10-CM | POA: Insufficient documentation

## 2013-02-23 DIAGNOSIS — R109 Unspecified abdominal pain: Secondary | ICD-10-CM | POA: Insufficient documentation

## 2013-02-23 DIAGNOSIS — Z3202 Encounter for pregnancy test, result negative: Secondary | ICD-10-CM | POA: Insufficient documentation

## 2013-02-23 LAB — URINE MICROSCOPIC-ADD ON

## 2013-02-23 LAB — URINALYSIS, ROUTINE W REFLEX MICROSCOPIC
Bilirubin Urine: NEGATIVE
Ketones, ur: NEGATIVE mg/dL
Protein, ur: NEGATIVE mg/dL
Urobilinogen, UA: 0.2 mg/dL (ref 0.0–1.0)

## 2013-02-23 LAB — WET PREP, GENITAL
Clue Cells Wet Prep HPF POC: NONE SEEN
Trich, Wet Prep: NONE SEEN
Yeast Wet Prep HPF POC: NONE SEEN

## 2013-02-23 LAB — POCT PREGNANCY, URINE: Preg Test, Ur: NEGATIVE

## 2013-02-23 MED ORDER — PROMETHAZINE HCL 25 MG PO TABS: 25.0000 mg | ORAL_TABLET | Freq: Four times a day (QID) | ORAL | Status: DC | PRN

## 2013-02-23 MED ORDER — HYDROMORPHONE HCL PF 1 MG/ML IJ SOLN
1.0000 mg | Freq: Once | INTRAMUSCULAR | Status: AC
  Administered 2013-02-23: 1 mg via INTRAVENOUS
  Filled 2013-02-23: qty 1

## 2013-02-23 MED ORDER — HYDROMORPHONE HCL PF 1 MG/ML IJ SOLN
1.0000 mg | Freq: Once | INTRAMUSCULAR | Status: AC
  Administered 2013-02-23: 1 mg via INTRAMUSCULAR
  Filled 2013-02-23: qty 1

## 2013-02-23 MED ORDER — ONDANSETRON 4 MG PO TBDP
4.0000 mg | ORAL_TABLET | Freq: Once | ORAL | Status: AC
  Administered 2013-02-23: 4 mg via ORAL
  Filled 2013-02-23: qty 1

## 2013-02-23 MED ORDER — HYDROCODONE-ACETAMINOPHEN 5-325 MG PO TABS: ORAL_TABLET | ORAL | Status: DC

## 2013-02-23 MED ORDER — PROMETHAZINE HCL 25 MG/ML IJ SOLN
25.0000 mg | Freq: Once | INTRAMUSCULAR | Status: AC
  Administered 2013-02-23: 25 mg via INTRAMUSCULAR
  Filled 2013-02-23: qty 1

## 2013-02-23 MED ORDER — KETOROLAC TROMETHAMINE 60 MG/2ML IM SOLN
60.0000 mg | Freq: Once | INTRAMUSCULAR | Status: AC
  Administered 2013-02-23: 60 mg via INTRAMUSCULAR
  Filled 2013-02-23: qty 2

## 2013-02-23 NOTE — ED Notes (Addendum)
Pt reports intermittent 10/10 centralized pelvic/lower abdominal pain that began on 02/21/13. Pt reports the pain as pressure. Pt denies discharge, however reports that she is having vaginal bleeding with dime sized clots, however she is on her period. Pt reports nausea, however denies emesis or diarrhea.

## 2013-02-23 NOTE — ED Notes (Signed)
PA at bedside.

## 2013-02-23 NOTE — Progress Notes (Signed)
Patient confirms she does not have apcp or insurance.  [facility] provided patient a list of pcps who accept self pay patients, information on Affordable Care Act and Medicaid for insurance, list of discount pharmacies such as [facility], [facility] etc and website needymeds.org for medication assistance, and list of financial assistance in the communtiy such as local churches and [employer].  Patient thankful for resources.  No further needs at this time.

## 2013-02-23 NOTE — ED Provider Notes (Signed)
CSN: [id number]     Arrival date & time 02/23/13  1953 History   First MD Initiated Contact with Patient 02/23/13 2006     Chief Complaint  Patient presents with  . Pelvic Pain   (Consider location/radiation/quality/duration/timing/severity/associated sxs/prior Treatment) HPI Pt is a 47yo female with hx of HTN c/o intermittent centralized pelvic pain that started Wednesday, 9/3 while patient was walking.  Pain lasts for 2-3 hours at a time, severe pressure "like labor pains," 10/10 that radiate to lower back.  States her LMP started Saturday, 8/29, but had stopped on Tuesday.  She then noticed dime sized clots for the last 3 days.  Pt also c/o nausea but no vomiting or diarrhea.  Pain is made worse with urination because it causes increased pressure but no burning with urination.  Denies vaginal discharge or hematuria.  Denies any resent sexual intercourse or concern for STD. Denies heavy lifting, trauma, or similar pain.  Does report hx of "small" ovarian cysts many years ago but states she was told nothing needed to be done due to small size. Last OB/GYN visit 2 years ago. Reports tubal ligation, and LEEP procedure several years ago.  Past Medical History  Diagnosis Date  . Hypertension    Past Surgical History  Procedure Laterality Date  . Tubal ligation    . Cholecystectomy    . Appendectomy     No family history on file. History  Substance Use Topics  . Smoking status: Never Smoker   . Smokeless tobacco: Never Used  . Alcohol Use: No   OB History   Grav Para Term Preterm Abortions TAB SAB Ect Mult Living                 Review of Systems  Constitutional: Negative for fever and chills.  Gastrointestinal: Positive for nausea and abdominal pain. Negative for vomiting.  Genitourinary: Positive for frequency, vaginal bleeding and pelvic pain. Negative for dysuria, vaginal discharge and vaginal pain.  All other systems reviewed and are negative.    Allergies   Lisinopril  Home Medications   Current Outpatient Rx  Name  Route  Sig  Dispense  Refill  . ibuprofen (ADVIL,MOTRIN) 200 MG tablet   Oral   Take 400 mg by mouth every 6 (six) hours as needed for pain.         Marland Kitchen HYDROcodone-acetaminophen (NORCO/VICODIN) 5-325 MG per tablet      Take 1-2 pills every 4-6 hours as needed for pain.   10 tablet   0   . promethazine (PHENERGAN) 25 MG tablet   Oral   Take 1 tablet (25 mg total) by mouth every 6 (six) hours as needed for nausea.   12 tablet   0    BP 106/55  Pulse 71  Temp(Src) 98.3 F (36.8 C) (Oral)  Resp 24  SpO2 96%  LMP 02/23/2013 Physical Exam  Nursing note and vitals reviewed. Constitutional: She appears well-developed and well-nourished. No distress.  Morbidly obese female lying in exam bed. Tearful, appears uncomfortable.   HENT:  Head: Normocephalic and atraumatic.  Eyes: Conjunctivae are normal. No scleral icterus.  Neck: Normal range of motion.  Cardiovascular: Normal rate, regular rhythm and normal heart sounds.   Pulmonary/Chest: Effort normal and breath sounds normal. No respiratory distress. She has no wheezes. She has no rales. She exhibits no tenderness.  Abdominal: Soft. Bowel sounds are normal. She exhibits no distension and no mass. There is tenderness ( lower abdomen, central pelvis.). There is  no rebound and no guarding.  Genitourinary: No labial fusion. There is no rash, tenderness, lesion or injury on the right labia. There is no rash, tenderness, lesion or injury on the left labia. Cervix exhibits motion tenderness and discharge (dark blood). Cervix exhibits no friability. Right adnexum displays no mass, no tenderness and no fullness. Left adnexum displays no mass, no tenderness and no fullness. There is bleeding around the vagina. No vaginal discharge found.  Body habitus limits palpation of adnexa.  TTP on bimanual exam with CMT.  Moderate dark blood in vaginal canal.    Musculoskeletal: Normal range  of motion. She exhibits tenderness ( lower lumbar musculature).  Neurological: She is alert.  Skin: Skin is warm and dry. She is not diaphoretic.    ED Course  Procedures (including critical care time) Labs Review Labs Reviewed  WET PREP, GENITAL - Abnormal; Notable for the following:    WBC, Wet Prep HPF POC MANY (*)    All other components within normal limits  URINALYSIS, ROUTINE W REFLEX MICROSCOPIC - Abnormal; Notable for the following:    Hgb urine dipstick LARGE (*)    All other components within normal limits  GC/CHLAMYDIA PROBE AMP  URINE MICROSCOPIC-ADD ON  POCT PREGNANCY, URINE   Imaging Review Ct Abdomen Pelvis Wo Contrast  02/23/2013   *RADIOLOGY REPORT*  Clinical Data:  Pelvic pain and pressure, vaginal bleeding, central pelvic and lower abdominal pain which began on 02/21/2013  CT ABDOMEN AND PELVIS WITHOUT CONTRAST  Technique:  Multidetector CT imaging of the abdomen and pelvis was performed following the standard protocol without intravenous contrast. Sagittal and coronal MPR images reconstructed from axial data set.  Comparison: 01/25/2012, 12/18/2010  Findings: Small nodular foci at posterior left lower lobe sulcus image 19 unchanged since 2012. Gallbladder and appendix surgically absent. Scattered fatty infiltration of liver, inhomogeneous. Within limits of a nonenhanced exam, liver, spleen, pancreas, kidneys, and adrenal glands otherwise normal appearance. Unremarkable ureters, bladder, and right ovary.  Question left ovarian cyst 2.8 x 2.5 cm. Probable Nabothian cyst at cervix. Minimal sigmoid diverticulosis without diverticulitis changes. Stomach and bowel loops otherwise normal appearance. Small umbilical hernia containing fat. No mass, adenopathy, free fluid, or inflammatory process. No acute osseous findings.  IMPRESSION: Inhomogeneous fatty infiltration of liver. Small left ovarian cyst 2.8 x 2.5 cm. Small umbilical hernia containing fat. Otherwise negative exam.    Original Report Authenticated By: Ulyses Southward, M.D.   US Transvaginal Non-ob  02/23/2013   *RADIOLOGY REPORT*  Clinical Data: No pelvic pain.  Tubal ligation.  Negative pregnancy test.  TRANSABDOMINAL AND TRANSVAGINAL ULTRASOUND OF PELVIS Technique:  Both transabdominal and transvaginal ultrasound examinations of the pelvis were performed. Transabdominal technique was performed for global imaging of the pelvis including uterus, ovaries, adnexal regions, and pelvic cul-de-sac.  It was necessary to proceed with endovaginal exam following the transabdominal exam to visualize the endometrium and over.  Comparison:  CT 01/25/2012  Findings:  Uterus: Normal in size and 13.0 x 5.6 x 6.0 cm.  Endometrium: Endometrium is uniformly thickened to 21- mm. Echogenic material within the endometrial canal.  No significant vascularity of this endometrial material.  Right ovary:  Not identified  Left ovary: Normal size of 4.5 x 3.747 cm.  There is a 3.1 cm cyst associated with the left ovary seen on the transabdominal imaging. This cyst is anechoic without nodularity or flow.  Other findings: No free fluid  IMPRESSION:  1.  Thickened endometrium with echogenic material within the canal.  Recommend non emergent GYN consultation for further evaluation and potential hydro- hysterosonography. 2.  Functional ovarian cyst of the left ovary.  3.  Right ovary not identified.   Original Report Authenticated By: Genevive Bi, M.D.   US Pelvis Complete  02/23/2013   *RADIOLOGY REPORT*  Clinical Data: No pelvic pain.  Tubal ligation.  Negative pregnancy test.  TRANSABDOMINAL AND TRANSVAGINAL ULTRASOUND OF PELVIS Technique:  Both transabdominal and transvaginal ultrasound examinations of the pelvis were performed. Transabdominal technique was performed for global imaging of the pelvis including uterus, ovaries, adnexal regions, and pelvic cul-de-sac.  It was necessary to proceed with endovaginal exam following the transabdominal exam to  visualize the endometrium and over.  Comparison:  CT 01/25/2012  Findings:  Uterus: Normal in size and 13.0 x 5.6 x 6.0 cm.  Endometrium: Endometrium is uniformly thickened to 21- mm. Echogenic material within the endometrial canal.  No significant vascularity of this endometrial material.  Right ovary:  Not identified  Left ovary: Normal size of 4.5 x 3.747 cm.  There is a 3.1 cm cyst associated with the left ovary seen on the transabdominal imaging. This cyst is anechoic without nodularity or flow.  Other findings: No free fluid  IMPRESSION:  1.  Thickened endometrium with echogenic material within the canal.  Recommend non emergent GYN consultation for further evaluation and potential hydro- hysterosonography. 2.  Functional ovarian cyst of the left ovary.  3.  Right ovary not identified.   Original Report Authenticated By: Genevive Bi, M.D.    MDM   1. Pelvic pain   2. Nausea   3. Vaginal bleeding    Pain appears pelvic in nature, will get pelvic ultrasound and perform pelvic exam.  Pelvic U/S showed 3.1cm cyst.  Pt still uncomfortable after 2mg  dilaudid and Toradol.  Discussed pt with Dr. Juleen China who suggested abd/pelvis CT to ensure no intraabdominal cause of pt's pain.   CT: small left ovarian cyst 2.8 x 2.5cm, small umbilical hernia containing fat.  Otherwise negative exam.    Do not suspect emergent process taking place at this time.  No further workup is needed at this time.  Advised pt to f/u with [facility] for further evaluation and treatment of pelvic pain and bleeding. Rx: norco and phenergan.  Return precautions provided. Pt verbalized understanding and agreement with tx plan.   Junius Finner, PA-C 02/24/13 0104

## 2013-02-24 LAB — GC/CHLAMYDIA PROBE AMP
CT Probe RNA: NEGATIVE
GC Probe RNA: NEGATIVE

## 2013-02-25 ENCOUNTER — Inpatient Hospital Stay (HOSPITAL_COMMUNITY)
Admission: AD | Admit: 2013-02-25 | Discharge: 2013-02-25 | Disposition: A | Discharge status: Home / Self Care | Payer: PRIVATE HEALTH INSURANCE | Source: Ambulatory Visit | Attending: Obstetrics & Gynecology | Admitting: Obstetrics & Gynecology

## 2013-02-25 ENCOUNTER — Inpatient Hospital Stay (HOSPITAL_COMMUNITY): Payer: PRIVATE HEALTH INSURANCE

## 2013-02-25 ENCOUNTER — Encounter (HOSPITAL_COMMUNITY): Payer: Self-pay | Admitting: *Deleted

## 2013-02-25 DIAGNOSIS — D219 Benign neoplasm of connective and other soft tissue, unspecified: Secondary | ICD-10-CM

## 2013-02-25 DIAGNOSIS — N949 Unspecified condition associated with female genital organs and menstrual cycle: Secondary | ICD-10-CM | POA: Insufficient documentation

## 2013-02-25 DIAGNOSIS — N946 Dysmenorrhea, unspecified: Secondary | ICD-10-CM | POA: Insufficient documentation

## 2013-02-25 DIAGNOSIS — N83209 Unspecified ovarian cyst, unspecified side: Secondary | ICD-10-CM | POA: Insufficient documentation

## 2013-02-25 DIAGNOSIS — D259 Leiomyoma of uterus, unspecified: Secondary | ICD-10-CM | POA: Insufficient documentation

## 2013-02-25 HISTORY — DX: Umbilical hernia without obstruction or gangrene: K42.9

## 2013-02-25 HISTORY — DX: Unspecified ovarian cyst, unspecified side: N83.209

## 2013-02-25 LAB — CBC WITH DIFFERENTIAL/PLATELET
Basophils Relative: 0 % (ref 0–1)
Eosinophils Absolute: 0.3 10*3/uL (ref 0.0–0.7)
Eosinophils Relative: 3 % (ref 0–5)
MCH: 24.5 pg — ABNORMAL LOW (ref 26.0–34.0)
MCHC: 32.4 g/dL (ref 30.0–36.0)
MCV: 75.5 fL — ABNORMAL LOW (ref 78.0–100.0)
Neutrophils Relative %: 65 % (ref 43–77)
Platelets: 239 10*3/uL (ref 150–400)

## 2013-02-25 LAB — URINE MICROSCOPIC-ADD ON

## 2013-02-25 LAB — URINALYSIS, ROUTINE W REFLEX MICROSCOPIC
Bilirubin Urine: NEGATIVE
Ketones, ur: NEGATIVE mg/dL
Nitrite: NEGATIVE
Protein, ur: NEGATIVE mg/dL
Urobilinogen, UA: 0.2 mg/dL (ref 0.0–1.0)

## 2013-02-25 LAB — COMPREHENSIVE METABOLIC PANEL
ALT: 38 U/L — ABNORMAL HIGH (ref 0–35)
Albumin: 3.2 g/dL — ABNORMAL LOW (ref 3.5–5.2)
Alkaline Phosphatase: 84 U/L (ref 39–117)
BUN: 11 mg/dL (ref 6–23)
Calcium: 9.6 mg/dL (ref 8.4–10.5)
GFR calc Af Amer: >90 mL/min (ref >90)
Potassium: 4 mEq/L (ref 3.5–5.1)
Sodium: 140 mEq/L (ref 135–145)
Total Protein: 6.6 g/dL (ref 6.0–8.3)

## 2013-02-25 LAB — POCT PREGNANCY, URINE: Preg Test, Ur: NEGATIVE

## 2013-02-25 MED ORDER — HYDROCODONE-IBUPROFEN 5-200 MG PO TABS: 1.0000 | ORAL_TABLET | Freq: Four times a day (QID) | ORAL | Status: DC | PRN

## 2013-02-25 MED ORDER — KETOROLAC TROMETHAMINE 60 MG/2ML IM SOLN
60.0000 mg | Freq: Once | INTRAMUSCULAR | Status: AC
  Administered 2013-02-25: 60 mg via INTRAMUSCULAR
  Filled 2013-02-25: qty 2

## 2013-02-25 NOTE — MAU Provider Note (Signed)
History     CSN: [id number]  Arrival date and time: 02/25/13 1958   First Provider Initiated Contact with Patient 02/25/13 2039      Chief Complaint  Patient presents with  . Pelvic Pain  . Vaginal Bleeding   Pelvic Pain The patient's primary symptoms include pelvic pain.  Vaginal Bleeding The patient's primary symptoms include pelvic pain.    Diana Schultz is a 48 y.o. [id number] who presents today with pelvic pain. She states that it started on Wednesday last week. On Friday night she went to [facility]. She had a pelvic ultrasound and CT that was negative. She was given RX for Vicodin, and states that it is not helping with the pain. She denies any N/V/D/C. She denies any dysuria, but does report urinary frequency and pressure. She denies any fever.   Past Medical History  Diagnosis Date  . Hypertension   . Ovarian cyst   . Umbilical hernia     Past Surgical History  Procedure Laterality Date  . Tubal ligation    . Cholecystectomy    . Appendectomy    . Leep    . Dilation and curettage of uterus      No family history on file.  History  Substance Use Topics  . Smoking status: Never Smoker   . Smokeless tobacco: Never Used  . Alcohol Use: No    Allergies:  Allergies  Allergen Reactions  . Lisinopril Cough    Prescriptions prior to admission  Medication Sig Dispense Refill  . HYDROcodone-acetaminophen (NORCO/VICODIN) 5-325 MG per tablet Take 1-2 pills every 4-6 hours as needed for pain.  10 tablet  0  . ibuprofen (ADVIL,MOTRIN) 200 MG tablet Take 400 mg by mouth every 6 (six) hours as needed for pain.      . promethazine (PHENERGAN) 25 MG tablet Take 1 tablet (25 mg total) by mouth every 6 (six) hours as needed for nausea.  12 tablet  0    Review of Systems  Genitourinary: Positive for vaginal bleeding and pelvic pain.   Physical Exam   Blood pressure 117/78, pulse 75, temperature 98 F (36.7 C), temperature source Oral, resp. rate 20, height 5' 3.6" (1.615 m),  weight 134.083 kg (295 lb 9.6 oz), last menstrual period 02/18/2013.  Physical Exam  Nursing note and vitals reviewed. Constitutional: She is oriented to person, place, and time. She appears well-developed and well-nourished. No distress.  Cardiovascular: Normal rate.   Respiratory: Effort normal.  GI: Soft. There is no tenderness.  Genitourinary:   External: no lesion Vagina: small amount of white discharge Cervix: pink, smooth, CMT is present  Uterus: NSSC Adnexa: difficult to assess 2/2 body habitus   Neurological: She is alert and oriented to person, place, and time.  Skin: Skin is warm and dry.  Psychiatric: She has a normal mood and affect.    MAU Course  Procedures  Results for orders placed during the hospital encounter of 02/25/13 (from the past 24 hour(s))  URINALYSIS, ROUTINE W REFLEX MICROSCOPIC     Status: Abnormal   Collection Time    02/25/13  8:16 PM      Result Value Range   Color, Urine YELLOW  YELLOW   APPearance CLEAR  CLEAR   Specific Gravity, Urine >1.030 (*) 1.005 - 1.030   pH 6.0  5.0 - 8.0   Glucose, UA NEGATIVE  NEGATIVE mg/dL   Hgb urine dipstick LARGE (*) NEGATIVE   Bilirubin Urine NEGATIVE  NEGATIVE   Ketones, ur  NEGATIVE  NEGATIVE mg/dL   Protein, ur NEGATIVE  NEGATIVE mg/dL   Urobilinogen, UA 0.2  0.0 - 1.0 mg/dL   Nitrite NEGATIVE  NEGATIVE   Leukocytes, UA NEGATIVE  NEGATIVE  URINE MICROSCOPIC-ADD ON     Status: Abnormal   Collection Time    02/25/13  8:16 PM      Result Value Range   Squamous Epithelial / LPF FEW (*) RARE   WBC, UA 0-2  <3 WBC/hpf   RBC / HPF 3-6  <3 RBC/hpf   Bacteria, UA FEW (*) RARE   Crystals CA OXALATE CRYSTALS (*) NEGATIVE  CBC WITH DIFFERENTIAL     Status: Abnormal   Collection Time    02/25/13  8:38 PM      Result Value Range   WBC 10.9 (*) 4.0 - 10.5 K/uL   RBC 4.94  3.87 - 5.11 MIL/uL   Hemoglobin 12.1  12.0 - 15.0 g/dL   HCT 40.9  81.1 - 91.4 %   MCV 75.5 (*) 78.0 - 100.0 fL   MCH 24.5 (*) 26.0 -  34.0 pg   MCHC 32.4  30.0 - 36.0 g/dL   RDW 78.2 (*) 95.6 - 21.3 %   Platelets 239  150 - 400 K/uL   Neutrophils Relative % 65  43 - 77 %   Neutro Abs 7.1  1.7 - 7.7 K/uL   Lymphocytes Relative 25  12 - 46 %   Lymphs Abs 2.8  0.7 - 4.0 K/uL   Monocytes Relative 6  3 - 12 %   Monocytes Absolute 0.7  0.1 - 1.0 K/uL   Eosinophils Relative 3  0 - 5 %   Eosinophils Absolute 0.3  0.0 - 0.7 K/uL   Basophils Relative 0  0 - 1 %   Basophils Absolute 0.0  0.0 - 0.1 K/uL  COMPREHENSIVE METABOLIC PANEL     Status: Abnormal   Collection Time    02/25/13  8:38 PM      Result Value Range   Sodium 140  135 - 145 mEq/L   Potassium 4.0  3.5 - 5.1 mEq/L   Chloride 104  96 - 112 mEq/L   CO2 28  19 - 32 mEq/L   Glucose, Bld 112 (*) 70 - 99 mg/dL   BUN 11  6 - 23 mg/dL   Creatinine, Ser 0.86  0.50 - 1.10 mg/dL   Calcium 9.6  8.4 - 57.8 mg/dL   Total Protein 6.6  6.0 - 8.3 g/dL   Albumin 3.2 (*) 3.5 - 5.2 g/dL   AST 26  0 - 37 U/L   ALT 38 (*) 0 - 35 U/L   Alkaline Phosphatase 84  39 - 117 U/L   Total Bilirubin 0.3  0.3 - 1.2 mg/dL   GFR calc non Af Amer >90  >90 mL/min   GFR calc Af Amer >90  >90 mL/min  POCT PREGNANCY, URINE     Status: None   Collection Time    02/25/13  8:49 PM      Result Value Range   Preg Test, Ur NEGATIVE  NEGATIVE   US Transvaginal Non-ob  02/25/2013   *RADIOLOGY REPORT*  Clinical Data: Follow up ovarian cyst  TRANSABDOMINAL AND TRANSVAGINAL ULTRASOUND OF PELVIS  Technique:  Both transabdominal and transvaginal ultrasound examinations of the pelvis were performed.  Transabdominal technique was performed for global imaging of the pelvis including uterus, ovaries, adnexal regions, and pelvic cul-de-sac.  It was necessary to proceed with  endovaginal exam following the transabdominal exam to visualize the endometrium and ovaries.  Comparison: 02/23/2013  Findings: Uterus:  The uterus measures 11.1 x 5.6 x 5.8 cm.  There are two sub centimeter intramural fibroids within the  anterior and right lateral myometrium.  Endometrium: Measures 13.7 mm in thickness.  Normal in appearance.  Right ovary: Normal appearance/no adnexal mass.  Measures 3.8 x 2.0 x 1.9 cm.  Left ovary: Left ovary is difficult to visualize.  Again seen is the left ovarian cyst which measures 3.2 x 2.6 x 3.3 cm.  This is unchanged in size from previous exam.  Other Findings:  No free fluid  IMPRESSION:  1.  Endometrium is decreased in thickness.  This now measures 13.7 mm versus 21 mm previously. 2.  Persistent left ovarian cyst without complicating features. This measures approximately 3.2 cm.   Original Report Authenticated By: Signa Kell, M.D.   US Pelvis Complete  02/25/2013   *RADIOLOGY REPORT*  Clinical Data: Follow up ovarian cyst  TRANSABDOMINAL AND TRANSVAGINAL ULTRASOUND OF PELVIS  Technique:  Both transabdominal and transvaginal ultrasound examinations of the pelvis were performed.  Transabdominal technique was performed for global imaging of the pelvis including uterus, ovaries, adnexal regions, and pelvic cul-de-sac.  It was necessary to proceed with endovaginal exam following the transabdominal exam to visualize the endometrium and ovaries.  Comparison: 02/23/2013  Findings: Uterus:  The uterus measures 11.1 x 5.6 x 5.8 cm.  There are two sub centimeter intramural fibroids within the anterior and right lateral myometrium.  Endometrium: Measures 13.7 mm in thickness.  Normal in appearance.  Right ovary: Normal appearance/no adnexal mass.  Measures 3.8 x 2.0 x 1.9 cm.  Left ovary: Left ovary is difficult to visualize.  Again seen is the left ovarian cyst which measures 3.2 x 2.6 x 3.3 cm.  This is unchanged in size from previous exam.  Other Findings:  No free fluid  IMPRESSION:  1.  Endometrium is decreased in thickness.  This now measures 13.7 mm versus 21 mm previously. 2.  Persistent left ovarian cyst without complicating features. This measures approximately 3.2 cm.   Original Report Authenticated  By: Signa Kell, M.D.     Assessment and Plan   1. Dysmenorrhea   2. Fibroid   3. Other and unspecified ovarian cyst    Discussed comfort measures RX: vicoprofen 5/200 #15 FU with GYN clinic   Tawnya Crook 02/25/2013, 8:59 PM

## 2013-02-25 NOTE — MAU Note (Signed)
Pt LMP 8/31, having bleeding off and on, intense pelvic pain since last Wednesday.  Seen at [facility] Friday night for the pain, ultrasound showed ovarian cyst, given pain medication and instructed to come [facility] continued.

## 2013-02-26 NOTE — MAU Provider Note (Signed)
Attestation of Attending Supervision of Advanced Practitioner (CNM/NP): Evaluation and management procedures were performed by the Advanced Practitioner under my supervision and collaboration.  I have reviewed the Advanced Practitioner's note and chart, and I agree with the management and plan.  HARRAWAY-SMITH, Alaia Lordi 7:53 PM

## 2013-02-27 NOTE — ED Provider Notes (Signed)
Medical screening examination/treatment/procedure(s) were performed by non-physician practitioner and as supervising physician I was immediately available for consultation/collaboration.  Raeford Razor, MD 02/27/13 [phone]

## 2013-03-05 ENCOUNTER — Encounter (HOSPITAL_COMMUNITY): Payer: Self-pay | Admitting: *Deleted

## 2013-03-05 ENCOUNTER — Inpatient Hospital Stay (HOSPITAL_COMMUNITY)
Admission: AD | Admit: 2013-03-05 | Discharge: 2013-03-06 | Disposition: A | Discharge status: Home / Self Care | Payer: No Typology Code available for payment source | Source: Ambulatory Visit | Attending: Obstetrics and Gynecology | Admitting: Obstetrics and Gynecology

## 2013-03-05 DIAGNOSIS — R109 Unspecified abdominal pain: Secondary | ICD-10-CM | POA: Insufficient documentation

## 2013-03-05 DIAGNOSIS — N949 Unspecified condition associated with female genital organs and menstrual cycle: Secondary | ICD-10-CM | POA: Insufficient documentation

## 2013-03-05 DIAGNOSIS — N939 Abnormal uterine and vaginal bleeding, unspecified: Secondary | ICD-10-CM

## 2013-03-05 DIAGNOSIS — N83209 Unspecified ovarian cyst, unspecified side: Secondary | ICD-10-CM | POA: Insufficient documentation

## 2013-03-05 DIAGNOSIS — N390 Urinary tract infection, site not specified: Secondary | ICD-10-CM

## 2013-03-05 DIAGNOSIS — N926 Irregular menstruation, unspecified: Secondary | ICD-10-CM | POA: Insufficient documentation

## 2013-03-05 DIAGNOSIS — Z9851 Tubal ligation status: Secondary | ICD-10-CM | POA: Insufficient documentation

## 2013-03-05 DIAGNOSIS — K5730 Diverticulosis of large intestine without perforation or abscess without bleeding: Secondary | ICD-10-CM | POA: Insufficient documentation

## 2013-03-05 DIAGNOSIS — R102 Pelvic and perineal pain: Secondary | ICD-10-CM

## 2013-03-05 NOTE — MAU Note (Addendum)
PT SAYS SHE   HAS BEEN BLEEDING - HAS BEEN TO [facility] AND  HERE - DX WITH FIBROIDS.  HAS AN APPOINTMNET ON 10-2  WITH GYN CLINIC.   SAYS AT 9PM-  PAIN-  IN LOWER BAD AND BACK.  Marland Kitchen  HAS PAD ON IN TRIAGE-  NOTHING.   TOOK IBUPROFEN AT 9PM-   400MG .  LAST  SEX-    2 MTHS AGO.   NO BIRTH CONTROL

## 2013-03-06 ENCOUNTER — Inpatient Hospital Stay (HOSPITAL_COMMUNITY): Payer: No Typology Code available for payment source

## 2013-03-06 DIAGNOSIS — N949 Unspecified condition associated with female genital organs and menstrual cycle: Secondary | ICD-10-CM

## 2013-03-06 LAB — CBC
Hemoglobin: 12 g/dL (ref 12.0–15.0)
MCH: 24.4 pg — ABNORMAL LOW (ref 26.0–34.0)
RBC: 4.91 MIL/uL (ref 3.87–5.11)

## 2013-03-06 LAB — POCT PREGNANCY, URINE: Preg Test, Ur: NEGATIVE

## 2013-03-06 LAB — URINALYSIS, ROUTINE W REFLEX MICROSCOPIC
Glucose, UA: NEGATIVE mg/dL
pH: 6 (ref 5.0–8.0)

## 2013-03-06 LAB — URINE MICROSCOPIC-ADD ON

## 2013-03-06 MED ORDER — CIPROFLOXACIN HCL 500 MG PO TABS: 500.0000 mg | ORAL_TABLET | Freq: Two times a day (BID) | ORAL | Status: DC

## 2013-03-06 MED ORDER — OXYCODONE-ACETAMINOPHEN 5-325 MG PO TABS
2.0000 | ORAL_TABLET | Freq: Once | ORAL | Status: AC
  Administered 2013-03-06: 2 via ORAL
  Filled 2013-03-06: qty 2

## 2013-03-06 MED ORDER — MEDROXYPROGESTERONE ACETATE 10 MG PO TABS: 10.0000 mg | ORAL_TABLET | Freq: Every day | ORAL | Status: DC

## 2013-03-06 MED ORDER — KETOROLAC TROMETHAMINE 60 MG/2ML IM SOLN
60.0000 mg | Freq: Once | INTRAMUSCULAR | Status: AC
  Administered 2013-03-06: 60 mg via INTRAMUSCULAR
  Filled 2013-03-06: qty 2

## 2013-03-06 MED ORDER — ONDANSETRON 8 MG PO TBDP
8.0000 mg | ORAL_TABLET | Freq: Once | ORAL | Status: AC
  Administered 2013-03-06: 8 mg via ORAL
  Filled 2013-03-06: qty 1

## 2013-03-06 MED ORDER — PHENAZOPYRIDINE HCL 100 MG PO TABS
200.0000 mg | ORAL_TABLET | Freq: Once | ORAL | Status: AC
Start: 2013-03-06 — End: 2013-03-06
  Administered 2013-03-06: 200 mg via ORAL
  Filled 2013-03-06: qty 2

## 2013-03-06 MED ORDER — HYDROMORPHONE HCL 2 MG PO TABS
2.0000 mg | ORAL_TABLET | Freq: Once | ORAL | Status: AC
  Administered 2013-03-06: 2 mg via ORAL
  Filled 2013-03-06: qty 1

## 2013-03-06 MED ORDER — CIPROFLOXACIN HCL 500 MG PO TABS
500.0000 mg | ORAL_TABLET | Freq: Once | ORAL | Status: AC
  Administered 2013-03-06: 500 mg via ORAL
  Filled 2013-03-06: qty 1

## 2013-03-06 MED ORDER — KETOROLAC TROMETHAMINE 10 MG PO TABS: 10.0000 mg | ORAL_TABLET | Freq: Four times a day (QID) | ORAL | Status: DC | PRN

## 2013-03-06 MED ORDER — PHENAZOPYRIDINE HCL 200 MG PO TABS: 200.0000 mg | ORAL_TABLET | Freq: Three times a day (TID) | ORAL | Status: DC | PRN

## 2013-03-06 MED ORDER — PROMETHAZINE HCL 25 MG/ML IJ SOLN
25.0000 mg | Freq: Once | INTRAMUSCULAR | Status: AC
  Administered 2013-03-06: 25 mg via INTRAMUSCULAR
  Filled 2013-03-06: qty 1

## 2013-03-06 MED ORDER — PROMETHAZINE HCL 25 MG PO TABS: 25.0000 mg | ORAL_TABLET | Freq: Four times a day (QID) | ORAL | Status: DC | PRN

## 2013-03-06 MED ORDER — HYDROMORPHONE HCL 2 MG PO TABS: 2.0000 mg | ORAL_TABLET | ORAL | Status: DC | PRN

## 2013-03-06 NOTE — MAU Provider Note (Signed)
Chief Complaint: No chief complaint on file.  First Provider Initiated Contact with Patient 03/06/13 0125     SUBJECTIVE HPI: Diana Schultz is a 47 y.o. G38P2002 female who presents with worsening low abdominal pain and irregular menstrual bleeding with clots since the last week of August. Was seen in [facility] ED in maternity admissions in the past 2 weeks for the same pain. Describes pain as constant w/ Intermittent worsening, followed by passage of clots. Ultrasound showed a 3 cm simple right ovarian cyst, endometrial thickness 21 mm and 2 fibroids. Abdominal CT showed: 1. Inhomogeneous fatty infiltration of liver.  2. Small left ovarian cyst 2.8 x 2.5 cm.  3. Small umbilical hernia containing fat.  4. Minimal sigmoid diverticulosis without diverticulitis change.  Patient states pain has gotten much worse and is now accompanied by moderate rectal pressure, and frequency and urgency of urination. No history of similar pain.   Last intercourse 2 months ago. Patient states she is in a mutually monogamous relationship. History of tubal ligation.  To 400 mg of ibuprofen and 9 PM with no relief. Has an appointment at [facility] outpatient clinic in one month, but states she cannot wait that long due to pain.  History of normal 28 day menstrual cycles with moderate bleeding and minimal dysmenorrhea prior to August.   Past Medical History  Diagnosis Date  . Hypertension   . Ovarian cyst   . Umbilical hernia    OB History  Gravida Para Term Preterm AB SAB TAB Ectopic Multiple Living  2 2 2       2     # Outcome Date GA Lbr Len/2nd Weight Sex Delivery Anes PTL Lv  2 TRM           1 TRM              Past Surgical History  Procedure Laterality Date  . Tubal ligation    . Cholecystectomy    . Appendectomy    . Leep    . Dilation and curettage of uterus     History   Social History  . Marital Status: Married    Spouse Name: N/A    Number of Children: N/A  . Years of Education:  N/A   Occupational History  . Not on file.   Social History Main Topics  . Smoking status: Never Smoker   . Smokeless tobacco: Never Used  . Alcohol Use: No  . Drug Use: No  . Sexual Activity: Yes    Birth Control/ Protection: Surgical   Other Topics Concern  . Not on file   Social History Narrative  . No narrative on file   No current facility-administered medications on file prior to encounter.   No current outpatient prescriptions on file prior to encounter.   Allergies  Allergen Reactions  . Lisinopril Cough    ROS: Pertinent positive items in HPI. Negative for fever, chills, nausea, vomiting, diarrhea, constipation, dysuria, hematuria, flank pain, vaginal discharge or dyspareunia. Has not had intercourse since pain started.  OBJECTIVE Blood pressure 97/57, pulse 69, temperature 98 F (36.7 C), temperature source Oral, resp. rate 20, height 5\' 2"  (1.575 m), weight 132.677 kg (292 lb 8 oz), last menstrual period 02/18/2013. GENERAL: Well-developed, well-nourished obese female in moderate distress. Curled up in fetal position. HEENT: Normocephalic HEART: normal rate RESP: normal effort ABDOMEN: Soft, moderate suprapubic tenderness. Negative rebound negative mass. Positive bowel sounds. BACK: Mild bilat low back tenderness. No CVAT.  EXTREMITIES: Nontender, no  edema.  NEURO: Alert and oriented SPECULUM EXAM: NEFG, scant dark red blood noted, small clot protruding through os. cervix clean. BIMANUAL: cervix closed, ? Nodule palpated at 9 o'clock; UTA uterine size due to body habitus, no adnexal tenderness or masses.  LAB RESULTS Results for orders placed during the hospital encounter of 03/05/13 (from the past 24 hour(s))  URINALYSIS, ROUTINE W REFLEX MICROSCOPIC     Status: Abnormal   Collection Time    03/05/13 11:30 PM      Result Value Range   Color, Urine RED (*) YELLOW   APPearance TURBID (*) CLEAR   Specific Gravity, Urine >1.030 (*) 1.005 - 1.030   pH 6.0   5.0 - 8.0   Glucose, UA NEGATIVE  NEGATIVE mg/dL   Hgb urine dipstick LARGE (*) NEGATIVE   Bilirubin Urine NEGATIVE  NEGATIVE   Ketones, ur NEGATIVE  NEGATIVE mg/dL   Protein, ur 30 (*) NEGATIVE mg/dL   Urobilinogen, UA 0.2  0.0 - 1.0 mg/dL   Nitrite NEGATIVE  NEGATIVE   Leukocytes, UA TRACE (*) NEGATIVE  URINE MICROSCOPIC-ADD ON     Status: Abnormal   Collection Time    03/05/13 11:30 PM      Result Value Range   Squamous Epithelial / LPF FEW (*) RARE   WBC, UA 0-2  <3 WBC/hpf   RBC / HPF 7-10  <3 RBC/hpf   Bacteria, UA RARE  RARE   Crystals CA OXALATE CRYSTALS (*) NEGATIVE   Urine-Other AMORPHOUS URATES/PHOSPHATES    CBC     Status: Abnormal   Collection Time    03/06/13 12:33 AM      Result Value Range   WBC 11.2 (*) 4.0 - 10.5 K/uL   RBC 4.91  3.87 - 5.11 MIL/uL   Hemoglobin 12.0  12.0 - 15.0 g/dL   HCT 86.5  78.4 - 69.6 %   MCV 74.5 (*) 78.0 - 100.0 fL   MCH 24.4 (*) 26.0 - 34.0 pg   MCHC 32.8  30.0 - 36.0 g/dL   RDW 29.5  28.4 - 13.2 %   Platelets 276  150 - 400 K/uL  POCT PREGNANCY, URINE     Status: None   Collection Time    03/06/13  3:39 AM      Result Value Range   Preg Test, Ur NEGATIVE  NEGATIVE    IMAGING Ct Abdomen Pelvis Wo Contrast  02/23/2013   *RADIOLOGY REPORT*  Clinical Data:  Pelvic pain and pressure, vaginal bleeding, central pelvic and lower abdominal pain which began on 02/21/2013  CT ABDOMEN AND PELVIS WITHOUT CONTRAST  Technique:  Multidetector CT imaging of the abdomen and pelvis was performed following the standard protocol without intravenous contrast. Sagittal and coronal MPR images reconstructed from axial data set.  Comparison: 01/25/2012, 12/18/2010  Findings: Small nodular foci at posterior left lower lobe sulcus image 19 unchanged since 2012. Gallbladder and appendix surgically absent. Scattered fatty infiltration of liver, inhomogeneous. Within limits of a nonenhanced exam, liver, spleen, pancreas, kidneys, and adrenal glands otherwise  normal appearance. Unremarkable ureters, bladder, and right ovary.  Question left ovarian cyst 2.8 x 2.5 cm. Probable Nabothian cyst at cervix. Minimal sigmoid diverticulosis without diverticulitis changes. Stomach and bowel loops otherwise normal appearance. Small umbilical hernia containing fat. No mass, adenopathy, free fluid, or inflammatory process. No acute osseous findings.  IMPRESSION: Inhomogeneous fatty infiltration of liver. Small left ovarian cyst 2.8 x 2.5 cm. Small umbilical hernia containing fat. Otherwise negative exam.   Original  Report Authenticated By: Ulyses Southward, M.D.   US Transvaginal Non-ob  02/25/2013   *RADIOLOGY REPORT*  Clinical Data: Follow up ovarian cyst  TRANSABDOMINAL AND TRANSVAGINAL ULTRASOUND OF PELVIS  Technique:  Both transabdominal and transvaginal ultrasound examinations of the pelvis were performed.  Transabdominal technique was performed for global imaging of the pelvis including uterus, ovaries, adnexal regions, and pelvic cul-de-sac.  It was necessary to proceed with endovaginal exam following the transabdominal exam to visualize the endometrium and ovaries.  Comparison: 02/23/2013  Findings: Uterus:  The uterus measures 11.1 x 5.6 x 5.8 cm.  There are two sub centimeter intramural fibroids within the anterior and right lateral myometrium.  Endometrium: Measures 13.7 mm in thickness.  Normal in appearance.  Right ovary: Normal appearance/no adnexal mass.  Measures 3.8 x 2.0 x 1.9 cm.  Left ovary: Left ovary is difficult to visualize.  Again seen is the left ovarian cyst which measures 3.2 x 2.6 x 3.3 cm.  This is unchanged in size from previous exam.  Other Findings:  No free fluid  IMPRESSION:  1.  Endometrium is decreased in thickness.  This now measures 13.7 mm versus 21 mm previously. 2.  Persistent left ovarian cyst without complicating features. This measures approximately 3.2 cm.   Original Report Authenticated By: Signa Kell, M.D.   US Transvaginal  Non-ob  02/23/2013   *RADIOLOGY REPORT*  Clinical Data: No pelvic pain.  Tubal ligation.  Negative pregnancy test.  TRANSABDOMINAL AND TRANSVAGINAL ULTRASOUND OF PELVIS Technique:  Both transabdominal and transvaginal ultrasound examinations of the pelvis were performed. Transabdominal technique was performed for global imaging of the pelvis including uterus, ovaries, adnexal regions, and pelvic cul-de-sac.  It was necessary to proceed with endovaginal exam following the transabdominal exam to visualize the endometrium and over.  Comparison:  CT 01/25/2012  Findings:  Uterus: Normal in size and 13.0 x 5.6 x 6.0 cm.  Endometrium: Endometrium is uniformly thickened to 21- mm. Echogenic material within the endometrial canal.  No significant vascularity of this endometrial material.  Right ovary:  Not identified  Left ovary: Normal size of 4.5 x 3.747 cm.  There is a 3.1 cm cyst associated with the left ovary seen on the transabdominal imaging. This cyst is anechoic without nodularity or flow.  Other findings: No free fluid  IMPRESSION:  1.  Thickened endometrium with echogenic material within the canal.  Recommend non emergent GYN consultation for further evaluation and potential hydro- hysterosonography. 2.  Functional ovarian cyst of the left ovary.  3.  Right ovary not identified.   Original Report Authenticated By: Genevive Bi, M.D.   US Pelvis Complete  02/25/2013   *RADIOLOGY REPORT*  Clinical Data: Follow up ovarian cyst  TRANSABDOMINAL AND TRANSVAGINAL ULTRASOUND OF PELVIS  Technique:  Both transabdominal and transvaginal ultrasound examinations of the pelvis were performed.  Transabdominal technique was performed for global imaging of the pelvis including uterus, ovaries, adnexal regions, and pelvic cul-de-sac.  It was necessary to proceed with endovaginal exam following the transabdominal exam to visualize the endometrium and ovaries.  Comparison: 02/23/2013  Findings: Uterus:  The uterus measures 11.1  x 5.6 x 5.8 cm.  There are two sub centimeter intramural fibroids within the anterior and right lateral myometrium.  Endometrium: Measures 13.7 mm in thickness.  Normal in appearance.  Right ovary: Normal appearance/no adnexal mass.  Measures 3.8 x 2.0 x 1.9 cm.  Left ovary: Left ovary is difficult to visualize.  Again seen is the left ovarian cyst which measures  3.2 x 2.6 x 3.3 cm.  This is unchanged in size from previous exam.  Other Findings:  No free fluid  IMPRESSION:  1.  Endometrium is decreased in thickness.  This now measures 13.7 mm versus 21 mm previously. 2.  Persistent left ovarian cyst without complicating features. This measures approximately 3.2 cm.   Original Report Authenticated By: Signa Kell, M.D.   US Pelvis Complete  02/23/2013   *RADIOLOGY REPORT*  Clinical Data: No pelvic pain.  Tubal ligation.  Negative pregnancy test.  TRANSABDOMINAL AND TRANSVAGINAL ULTRASOUND OF PELVIS Technique:  Both transabdominal and transvaginal ultrasound examinations of the pelvis were performed. Transabdominal technique was performed for global imaging of the pelvis including uterus, ovaries, adnexal regions, and pelvic cul-de-sac.  It was necessary to proceed with endovaginal exam following the transabdominal exam to visualize the endometrium and over.  Comparison:  CT 01/25/2012  Findings:  Uterus: Normal in size and 13.0 x 5.6 x 6.0 cm.  Endometrium: Endometrium is uniformly thickened to 21- mm. Echogenic material within the endometrial canal.  No significant vascularity of this endometrial material.  Right ovary:  Not identified  Left ovary: Normal size of 4.5 x 3.747 cm.  There is a 3.1 cm cyst associated with the left ovary seen on the transabdominal imaging. This cyst is anechoic without nodularity or flow.  Other findings: No free fluid  IMPRESSION:  1.  Thickened endometrium with echogenic material within the canal.  Recommend non emergent GYN consultation for further evaluation and potential hydro-  hysterosonography. 2.  Functional ovarian cyst of the left ovary.  3.  Right ovary not identified.   Original Report Authenticated By: Genevive Bi, M.D.    MAU COURSE 0145: No pain relief w/ Percocet #2. Dilaudid ordered. Awaiting [place]. Scant bleeding.   0310: C/O nausea. No change in pain. [place] pending. Verbal report no torsion. Polyp vs large clot in endometrial canal. [place] discussed w/ Dr. Jolayne Panther. Plan w/ inducing withdrawal bleed w/ Provera and requesting earlier F/U appt in clinic.   0410: Vomited. abd pain slightly improved to 6/10. Pt more relaxed-appearing, but requesting more pain and nausea meds. Now reports generalized HA. Toradol and Phenergan given IM. If no improvement, will discuss Obs for pain management w/ Dr. Jolayne Panther.   0545: Pain 4/10. Nausea resolved. Feeling much better. Moderate vaginal bleeding. Cipro and pyridium given w/ crackers. Tolerated well.  ASSESSMENT 1. Acute pelvic pain, female   2. Abnormal uterine bleeding--possible polyp  3. UTI (lower urinary tract infection)     PLAN Discharge home in stable condition. Lengthy discussion w/ pt about using Toradol and Pyridium first for pain and Daludid for breakthrough pain.  Bleeding precautions.  Requested earlier appt w/ [facility].     Follow-up Information   Follow up with [facility]. (will call you to schedule appointment)    Specialty:  Obstetrics and Gynecology   Contact information:   [address] [place] [address] [phone]      Follow up with [facility]. (As needed if symptoms worsen)    Contact information:   [address] [id number] [place] [address] [phone]       Medication List    STOP taking these medications       HYDROcodone-acetaminophen 5-325 MG per tablet  Commonly known as:  NORCO/VICODIN     hydrocodone-ibuprofen 5-200 MG per tablet  Commonly known as:  VICOPROFEN     ibuprofen 200 MG  tablet  Commonly known as:  ADVIL,MOTRIN      TAKE these medications       ciprofloxacin 500 MG tablet  Commonly known as:  CIPRO  Take 1 tablet (500 mg total) by mouth 2 (two) times daily.     HYDROmorphone 2 MG tablet  Commonly known as:  DILAUDID  Take 1 tablet (2 mg total) by mouth every 4 (four) hours as needed for pain. Use for breakthrough pain if no relief w/ Toradol and pyridium.     ketorolac 10 MG tablet  Commonly known as:  TORADOL  Take 1 tablet (10 mg total) by mouth every 6 (six) hours as needed for pain.     medroxyPROGESTERone 10 MG tablet  Commonly known as:  PROVERA  Take 1 tablet (10 mg total) by mouth daily.     phenazopyridine 200 MG tablet  Commonly known as:  PYRIDIUM  Take 1 tablet (200 mg total) by mouth 3 (three) times daily as needed for pain.     promethazine 25 MG tablet  Commonly known as:  PHENERGAN  Take 1 tablet (25 mg total) by mouth every 6 (six) hours as needed for nausea.         [place], CNM 03/06/2013  6:11 AM

## 2013-03-06 NOTE — MAU Provider Note (Signed)
Attestation of Attending Supervision of Advanced Practitioner (CNM/NP): Evaluation and management procedures were performed by the Advanced Practitioner under my supervision and collaboration.  I have reviewed the Advanced Practitioner's note and chart, and I agree with the management and plan.  Diana Schultz 03/06/2013 7:33 AM

## 2013-03-06 NOTE — MAU Note (Signed)
Pt called out stating she was vomiting. Ivonne Andrew CNM notified.

## 2013-03-07 ENCOUNTER — Other Ambulatory Visit (HOSPITAL_COMMUNITY)
Admission: RE | Admit: 2013-03-07 | Discharge: 2013-03-07 | Disposition: A | Discharge status: Home / Self Care | Payer: Self-pay | Source: Ambulatory Visit | Attending: Obstetrics & Gynecology | Admitting: Obstetrics & Gynecology

## 2013-03-07 ENCOUNTER — Ambulatory Visit (INDEPENDENT_AMBULATORY_CARE_PROVIDER_SITE_OTHER): Payer: PRIVATE HEALTH INSURANCE | Admitting: Obstetrics & Gynecology

## 2013-03-07 ENCOUNTER — Encounter: Payer: Self-pay | Admitting: Obstetrics & Gynecology

## 2013-03-07 VITALS — Ht 63.5 in | Wt 289.6 lb

## 2013-03-07 DIAGNOSIS — N920 Excessive and frequent menstruation with regular cycle: Secondary | ICD-10-CM

## 2013-03-07 DIAGNOSIS — Z01812 Encounter for preprocedural laboratory examination: Secondary | ICD-10-CM

## 2013-03-07 MED ORDER — MEGESTROL ACETATE 20 MG PO TABS: 40.0000 mg | ORAL_TABLET | Freq: Every day | ORAL | Status: DC

## 2013-03-07 NOTE — Progress Notes (Signed)
Subjective:     Patient ID: Diana Schultz, female   DOB: Mar 12, 1966, 47 y.o.   MRN: [id number]  HPI Pt c/o heavy menstrual bleeding since Aug 30th.  Pt was seen in the MAu and treated with Provera.  She reports that her bleeding has decreased since being on the Provera.  She also c/o pain assoc with the bleeding. Told after a sono that she had a mass in her endometrium.  Here for Endobx.  Denies f/c/n/v no weight loss.  Does c/o feeling weak.   Review of Systems     Objective:   Physical Exam Ht 5' 3.5" (1.613 m)  Wt 289 lb 9.6 oz (131.362 kg)  BMI 50.49 kg/m2  LMP 02/18/2013  The indications for endometrial biopsy were reviewed.   Risks of the biopsy including cramping, bleeding, infection, uterine perforation, inadequate specimen and need for additional procedures  were discussed. The patient states she understands and agrees to undergo procedure today. Consent was signed. Time out was performed. Urine HCG was negative. A sterile speculum was placed in the patient's vagina and the cervix was prepped with Betadine. A single-toothed tenaculum was placed on the anterior lip of the cervix to stabilize it. The 3 mm pipelle was introduced into the endometrial cavity without difficulty to a depth of 8cm, and a moderate amount of tissue was obtained and sent to pathology. The instruments were removed from the patient's vagina. Minimal bleeding from the cervix was noted. The patient tolerated the procedure well.    03/06/2013 EXAM:  TRANSVAGINAL ULTRASOUND OF PELVIS  DOPPLER ULTRASOUND OF OVARIES  TECHNIQUE:  Transvaginal ultrasound examination of the pelvis was performed  including evaluation of the uterus, ovaries, adnexal regions, and  pelvic cul-de-sac.  Color and duplex Doppler ultrasound was utilized to evaluate blood  flow to the ovaries.  COMPARISON: 02/25/2013 pelvic ultrasound  FINDINGS:  Uterus: 10 x 5 x 6 cm.  Endometrium: Similar to 02/23/2013 examination, there is extensive   mobile fluid with mid level echoes distending the endometrial canal  and cervical canal. There is an elongated masslike structure in the  canal without color Doppler flow, measuring at least 5 cm in length  by 1 cm in diameter.  Right ovary: Normal size, 2.4 x 1.9 x 3 cm.  Left ovary: Normal size, 2.4 x 1.9 x 3.3 cm. Spectral flow not  demonstrated within the left ovary due to its position in the  pelvis. Even so, there are no morphologic features suggestive of  torsion.  Pulsed Doppler evaluation demonstrates normal low-resistance  waveforms in the right ovary.  IMPRESSION:  1. Hemorrhage and fluid distends the endometrial and endocervical  canal, similar to 02/23/2013. Elongated masslike structure within  the canal is also again seen, which may represent blood clot, polyp,  or other mass.  2. Technically difficult documentation of ovarian blood flow. No  morphologic findings to suggest ovarian torsion.   CBC    Component Value Date/Time   WBC 11.2* 03/06/2013 0033   RBC 4.91 03/06/2013 0033   HGB 12.0 03/06/2013 0033   HCT 36.6 03/06/2013 0033   PLT 276 03/06/2013 0033   MCV 74.5* 03/06/2013 0033   MCH 24.4* 03/06/2013 0033   MCHC 32.8 03/06/2013 0033   RDW 15.4 03/06/2013 0033   LYMPHSABS 2.8 02/25/2013 2038   MONOABS 0.7 02/25/2013 2038   EOSABS 0.3 02/25/2013 2038   BASOSABS 0.0 02/25/2013 2038         Assessment:     Menorrhagia with thickenend  endometrium and possible endometrial mass ?polyp vs blood clot. If endobx does not reveal dx will schedule hysteroscopy to further eval and possibly remove polyp and/or other mass     Plan:     F/u surg path F/u 2 weeks  Megace 40mg  daily  Routine post-procedure instructions were given to the patient. The patient will follow up to review the results and for further management.

## 2013-03-07 NOTE — Patient Instructions (Signed)
Menorrhagia Dysfunctional uterine bleeding is different from a normal menstrual period. When periods are heavy or there is more bleeding than is usual for you, it is called menorrhagia. It may be caused by hormonal imbalance, or physical, metabolic, or other problems. Examination is necessary in order that your caregiver may treat treatable causes. If this is a continuing problem, a D&C may be needed. That means that the cervix (the opening of the uterus or womb) is dilated (stretched larger) and the lining of the uterus is scraped out. The tissue scraped out is then examined under a microscope by a specialist (pathologist) to make sure there is nothing of concern that needs further or more extensive treatment. HOME CARE INSTRUCTIONS   If medications were prescribed, take exactly as directed. Do not change or switch medications without consulting your caregiver.  Long term heavy bleeding may result in iron deficiency. Your caregiver may have prescribed iron pills. They help replace the iron your body lost from heavy bleeding. Take exactly as directed. Iron may cause constipation. If this becomes a problem, increase the bran, fruits, and roughage in your diet.  Do not take aspirin or medicines that contain aspirin one week before or during your menstrual period. Aspirin may make the bleeding worse.  If you need to change your sanitary pad or tampon more than once every 2 hours, stay in bed and rest as much as possible until the bleeding stops.  Eat well-balanced meals. Eat foods high in iron. Examples are leafy green vegetables, meat, liver, eggs, and whole grain breads and cereals. Do not try to lose weight until the abnormal bleeding has stopped and your blood iron level is back to normal. SEEK MEDICAL CARE IF:   You need to change your sanitary pad or tampon more than once an hour.  You develop nausea (feeling sick to your stomach) and vomiting, dizziness, or diarrhea while you are taking your  medicine.  You have any problems that may be related to the medicine you are taking. SEEK IMMEDIATE MEDICAL CARE IF:   You have a fever.  You develop chills.  You develop severe bleeding or start to pass blood clots.  You feel dizzy or faint. MAKE SURE YOU:   Understand these instructions.  Will watch your condition.  Will get help right away if you are not doing well or get worse. Document Released: 06/07/2005 Document Revised: 08/30/2011 Document Reviewed: 01/26/2008 [facility] Patient Information 2014 [place], [place]. Endometrial Biopsy This is a test in which a tissue sample (a biopsy) is taken from inside the uterus (womb). It is then looked at by a specialist under a microscope to see if the tissue is normal or abnormal. The endometrium is the lining of the uterus. This test helps determine where you are in your menstrual cycle and how hormone levels are affecting the lining of the uterus. Another use for this test is to diagnose endometrial cancer, tuberculosis, polyps, or inflammatory conditions and to evaluate uterine bleeding. PREPARATION FOR TEST No preparation or fasting is necessary. NORMAL FINDINGS No pathologic conditions. Presence of "secretory-type" endometrium 3 to 5 days before to normal menstruation. Ranges for normal findings may vary among different laboratories and hospitals. You should always check with your doctor after having lab work or other tests done to discuss the meaning of your test results and whether your values are considered within normal limits. MEANING OF TEST  Your caregiver will go over the test results with you and discuss the importance and meaning  of your results, as well as treatment options and the need for additional tests if necessary. OBTAINING THE TEST RESULTS It is your responsibility to obtain your test results. Ask the lab or department performing the test when and how you will get your results. Document Released: 10/08/2004 Document  Revised: 08/30/2011 Document Reviewed: 05/17/2008 [facility] Patient Information 2014 [place], [place].

## 2013-03-09 ENCOUNTER — Telehealth: Payer: Self-pay | Admitting: General Practice

## 2013-03-09 NOTE — Telephone Encounter (Signed)
Patient called and left message stating she understands her biopsy results are in and really needs a call back today with the results. Called patient stating that the results were not back yet. Patient stated Dr Erin Fulling called her that morning and stated she did not have time to go over the results right now because she was in between something but a nurse would call her back to go over results. Told patient Dr Erin Fulling just left the clinic but I can send her a message but that I cannot see any results at this time. Patient verbalized understanding and had no further questions

## 2013-03-12 ENCOUNTER — Telehealth: Payer: Self-pay | Admitting: General Practice

## 2013-03-12 NOTE — Telephone Encounter (Signed)
Patient called up to front office asking for endo bx results. Informed patient of normal results. Patient was satisfied, verbalized understanding and had no further questions

## 2013-03-12 NOTE — Telephone Encounter (Signed)
Patient called and left message stating she already talked to a nurse this morning and got her test results but she wants to know what to do now till her next appt because she is in pain and she would like someone to talk to her doctor please. Spoke to dr Erin Fulling about the patient, wants to schedule hysteroscopy & possible d&C as previously discussed with patient. Called patient and informed her that the doctor still wants to do surgery and that she should get getting a letter in the mail soon with her surgery date & to call us to cancel her 10/1 if she does not need it as time gets closer. Patient was satisfied, verbalized understanding and had no further questions

## 2013-03-19 ENCOUNTER — Encounter (HOSPITAL_COMMUNITY): Payer: Self-pay | Admitting: *Deleted

## 2013-03-21 ENCOUNTER — Ambulatory Visit: Payer: Self-pay | Admitting: Obstetrics & Gynecology

## 2013-03-21 ENCOUNTER — Encounter (HOSPITAL_COMMUNITY): Payer: Self-pay | Admitting: Pharmacist

## 2013-03-22 ENCOUNTER — Encounter: Payer: PRIVATE HEALTH INSURANCE | Admitting: Obstetrics & Gynecology

## 2013-03-23 ENCOUNTER — Ambulatory Visit (HOSPITAL_COMMUNITY)
Admission: RE | Admit: 2013-03-23 | Discharge: 2013-03-23 | Disposition: A | Discharge status: Home / Self Care | Payer: Self-pay | Source: Ambulatory Visit | Attending: Obstetrics & Gynecology | Admitting: Obstetrics & Gynecology

## 2013-03-23 ENCOUNTER — Encounter (HOSPITAL_COMMUNITY)
Admission: RE | Disposition: A | Discharge status: Home / Self Care | Payer: Self-pay | Source: Ambulatory Visit | Attending: Obstetrics & Gynecology

## 2013-03-23 ENCOUNTER — Ambulatory Visit (HOSPITAL_COMMUNITY): Payer: Self-pay | Admitting: Anesthesiology

## 2013-03-23 ENCOUNTER — Encounter (HOSPITAL_COMMUNITY): Payer: Self-pay | Admitting: *Deleted

## 2013-03-23 ENCOUNTER — Encounter (HOSPITAL_COMMUNITY): Payer: Self-pay | Admitting: Anesthesiology

## 2013-03-23 DIAGNOSIS — N938 Other specified abnormal uterine and vaginal bleeding: Secondary | ICD-10-CM

## 2013-03-23 DIAGNOSIS — N840 Polyp of corpus uteri: Secondary | ICD-10-CM

## 2013-03-23 DIAGNOSIS — N920 Excessive and frequent menstruation with regular cycle: Secondary | ICD-10-CM | POA: Insufficient documentation

## 2013-03-23 DIAGNOSIS — N949 Unspecified condition associated with female genital organs and menstrual cycle: Secondary | ICD-10-CM

## 2013-03-23 HISTORY — PX: ABLATION: SHX5711

## 2013-03-23 HISTORY — PX: DILITATION & CURRETTAGE/HYSTROSCOPY WITH NOVASURE ABLATION: SHX5568

## 2013-03-23 LAB — CBC
Hemoglobin: 12.9 g/dL (ref 12.0–15.0)
MCH: 24.1 pg — ABNORMAL LOW (ref 26.0–34.0)
MCV: 73.7 fL — ABNORMAL LOW (ref 78.0–100.0)
Platelets: 274 10*3/uL (ref 150–400)
RBC: 5.36 MIL/uL — ABNORMAL HIGH (ref 3.87–5.11)
WBC: 9.8 10*3/uL (ref 4.0–10.5)

## 2013-03-23 SURGERY — DILATATION & CURETTAGE/HYSTEROSCOPY WITH NOVASURE ABLATION
Anesthesia: General | Site: Vagina | Wound class: Clean Contaminated

## 2013-03-23 MED ORDER — LACTATED RINGERS IV SOLN
INTRAVENOUS | Status: DC
  Administered 2013-03-23 (×2): via INTRAVENOUS

## 2013-03-23 MED ORDER — LIDOCAINE HCL (CARDIAC) 20 MG/ML IV SOLN
INTRAVENOUS | Status: DC | PRN
  Administered 2013-03-23: 80 mg via INTRAVENOUS

## 2013-03-23 MED ORDER — FENTANYL CITRATE 0.05 MG/ML IJ SOLN
25.0000 ug | INTRAMUSCULAR | Status: DC | PRN
  Administered 2013-03-23 (×2): 25 ug via INTRAVENOUS

## 2013-03-23 MED ORDER — LACTATED RINGERS IV SOLN
INTRAVENOUS | Status: DC | PRN
  Administered 2013-03-23: 3000 mL via INTRAUTERINE

## 2013-03-23 MED ORDER — FENTANYL CITRATE 0.05 MG/ML IJ SOLN
INTRAMUSCULAR | Status: DC | PRN
  Administered 2013-03-23: 50 ug via INTRAVENOUS
  Administered 2013-03-23 (×2): 25 ug via INTRAVENOUS

## 2013-03-23 MED ORDER — KETOROLAC TROMETHAMINE 30 MG/ML IJ SOLN
INTRAMUSCULAR | Status: DC | PRN
  Administered 2013-03-23: 30 mg via INTRAVENOUS

## 2013-03-23 MED ORDER — BUPIVACAINE HCL (PF) 0.5 % IJ SOLN
INTRAMUSCULAR | Status: DC | PRN
  Administered 2013-03-23: 20 mL

## 2013-03-23 MED ORDER — PROPOFOL 10 MG/ML IV BOLUS
INTRAVENOUS | Status: DC | PRN
  Administered 2013-03-23: 250 mg via INTRAVENOUS

## 2013-03-23 MED ORDER — ONDANSETRON HCL 4 MG/2ML IJ SOLN
INTRAMUSCULAR | Status: DC | PRN
  Administered 2013-03-23: 4 mg via INTRAVENOUS

## 2013-03-23 MED ORDER — IBUPROFEN 800 MG PO TABS: 800.0000 mg | ORAL_TABLET | Freq: Three times a day (TID) | ORAL | Status: DC | PRN

## 2013-03-23 MED ORDER — KETOROLAC TROMETHAMINE 30 MG/ML IJ SOLN: 15.0000 mg | Freq: Once | INTRAMUSCULAR | Status: DC | PRN

## 2013-03-23 MED ORDER — DEXAMETHASONE SODIUM PHOSPHATE 10 MG/ML IJ SOLN
INTRAMUSCULAR | Status: DC | PRN
  Administered 2013-03-23: 10 mg via INTRAVENOUS

## 2013-03-23 MED ORDER — ONDANSETRON HCL 4 MG/2ML IJ SOLN: 4.0000 mg | Freq: Once | INTRAMUSCULAR | Status: DC | PRN

## 2013-03-23 MED ORDER — MEPERIDINE HCL 25 MG/ML IJ SOLN: 6.2500 mg | INTRAMUSCULAR | Status: DC | PRN

## 2013-03-23 MED ORDER — LACTATED RINGERS IV SOLN: INTRAVENOUS | Status: DC

## 2013-03-23 MED ORDER — BUPIVACAINE HCL (PF) 0.5 % IJ SOLN
INTRAMUSCULAR | Status: AC
  Filled 2013-03-23: qty 30

## 2013-03-23 MED ORDER — MIDAZOLAM HCL 5 MG/5ML IJ SOLN
INTRAMUSCULAR | Status: DC | PRN
  Administered 2013-03-23: 2 mg via INTRAVENOUS

## 2013-03-23 SURGICAL SUPPLY — 15 items
ABLATOR ENDOMETRIAL BIPOLAR (ABLATOR) ×2 IMPLANT
CATH ROBINSON RED A/P 16FR (CATHETERS) ×2 IMPLANT
CLOTH BEACON ORANGE TIMEOUT ST (SAFETY) ×2 IMPLANT
CONTAINER PREFILL 10% NBF 60ML (FORM) ×2 IMPLANT
DRESSING TELFA 8X3 (GAUZE/BANDAGES/DRESSINGS) ×2 IMPLANT
GLOVE BIO SURGEON STRL SZ7 (GLOVE) ×2 IMPLANT
GLOVE BIOGEL PI IND STRL 7.0 (GLOVE) ×2 IMPLANT
GLOVE BIOGEL PI INDICATOR 7.0 (GLOVE) ×2
GOWN STRL REIN XL XLG (GOWN DISPOSABLE) ×4 IMPLANT
PACK HYSTEROSCOPY LF (CUSTOM PROCEDURE TRAY) IMPLANT
PACK VAGINAL MINOR WOMEN LF (CUSTOM PROCEDURE TRAY) ×2 IMPLANT
PAD OB MATERNITY 4.3X12.25 (PERSONAL CARE ITEMS) ×2 IMPLANT
TOWEL OR 17X24 6PK STRL BLUE (TOWEL DISPOSABLE) ×4 IMPLANT
TUBING HYDROFLEX HYSTEROSCOPY (TUBING) ×2 IMPLANT
WATER STERILE IRR 1000ML POUR (IV SOLUTION) ×2 IMPLANT

## 2013-03-23 NOTE — Anesthesia Preprocedure Evaluation (Addendum)
Anesthesia Evaluation    Reviewed: Allergy & Precautions, H&P , NPO status , Patient's Chart, lab work & pertinent test results  Airway Mallampati: II TM Distance: >3 FB Neck ROM: full    Dental no notable dental hx. (+) Teeth Intact   Pulmonary neg pulmonary ROS,          Cardiovascular     Neuro/Psych negative neurological ROS  negative psych ROS   GI/Hepatic negative GI ROS, Neg liver ROS,   Endo/Other  Morbid obesity  Renal/GU negative Renal ROS     Musculoskeletal   Abdominal (+) + obese,   Peds  Hematology negative hematology ROS (+)   Anesthesia Other Findings   Reproductive/Obstetrics negative OB ROS                          Anesthesia Physical Anesthesia Plan  ASA: III  Anesthesia Plan: General   Post-op Pain Management:    Induction: Intravenous  Airway Management Planned: LMA  Additional Equipment:   Intra-op Plan:   Post-operative Plan:   Informed Consent: I have reviewed the patients History and Physical, chart, labs and discussed the procedure including the risks, benefits and alternatives for the proposed anesthesia with the patient or authorized representative who has indicated his/her understanding and acceptance.     Plan Discussed with: CRNA and Surgeon  Anesthesia Plan Comments:         Anesthesia Quick Evaluation

## 2013-03-23 NOTE — Transfer of Care (Signed)
Immediate Anesthesia Transfer of Care Note  Patient: Diana Schultz  Procedure(s) Performed: Procedure(s): DILATATION & CURETTAGE/HYSTEROSCOPY WITH NOVASURE ABLATION (N/A)  Patient Location: PACU  Anesthesia Type:General  Level of Consciousness: sedated  Airway & Oxygen Therapy: Patient Spontanous Breathing and Patient connected to nasal cannula oxygen  Post-op Assessment: Report given to PACU RN and Post -op Vital signs reviewed and stable  Post vital signs: stable  Complications: No apparent anesthesia complications

## 2013-03-23 NOTE — H&P (Addendum)
Patient ID: Diana Schultz, female DOB: 08/05/1965, 47 y.o. MRN: [id number]  HPI Pt c/o heavy menstrual bleeding since Aug 30th. Pt was seen in the MAU and treated with Provera and Megace.  She also c/o pain assoc with the bleeding. Told after a sono that she had a mass in her endometrium. Here for Endobx.  Denies f/c/n/v no weight loss. Does c/o feeling weak.   Past Medical History  Diagnosis Date  . Ovarian cyst   . Umbilical hernia   . Hypertension     history,lost weight, controlled with diet and exercise, no current med  . Peripheral vascular disease     Hx at age 47 blood clot in right leg, d/c birth control pills and no other problems   Past Surgical History  Procedure Laterality Date  . Tubal ligation    . Cholecystectomy    . Appendectomy    . Leep    . Dilation and curettage of uterus     No current facility-administered medications on file prior to encounter.   Current Outpatient Prescriptions on File Prior to Encounter  Medication Sig Dispense Refill  . ciprofloxacin (CIPRO) 500 MG tablet Take 1 tablet (500 mg total) by mouth 2 (two) times daily.  6 tablet  0  . HYDROmorphone (DILAUDID) 2 MG tablet Take 1 tablet (2 mg total) by mouth every 4 (four) hours as needed for pain. Use for breakthrough pain if no relief w/ Toradol and pyridium.  20 tablet  0  . ketorolac (TORADOL) 10 MG tablet Take 1 tablet (10 mg total) by mouth every 6 (six) hours as needed for pain.  20 tablet  0  . megestrol (MEGACE) 20 MG tablet Take 2 tablets (40 mg total) by mouth daily.  30 tablet  3  . phenazopyridine (PYRIDIUM) 200 MG tablet Take 1 tablet (200 mg total) by mouth 3 (three) times daily as needed for pain.  9 tablet  0  . promethazine (PHENERGAN) 25 MG tablet Take 1 tablet (25 mg total) by mouth every 6 (six) hours as needed for nausea.  20 tablet  0   Allergies  Allergen Reactions  . Lisinopril Cough   History   Social History  . Marital Status: Married    Spouse Name: N/A    Number  of Children: N/A  . Years of Education: N/A   Occupational History  . Not on file.   Social History Main Topics  . Smoking status: Never Smoker   . Smokeless tobacco: Never Used  . Alcohol Use: No  . Drug Use: No  . Sexual Activity: Yes    Birth Control/ Protection: Surgical   Other Topics Concern  . Not on file   Social History Narrative  . No narrative on file       Review of Systems  Objective:   Physical Exam Ht 5' 3.5" (1.613 m)  Wt 289 lb 9.6 oz (131.362 kg)  BMI 50.49 kg/m2  LMP 02/18/2013  The indications for endometrial biopsy were reviewed. Risks of the biopsy including cramping, bleeding, infection, uterine perforation, inadequate specimen and need for additional procedures were discussed. The patient states she understands and agrees to undergo procedure today. Consent was signed. Time out was performed. Urine HCG was negative.  A sterile speculum was placed in the patient's vagina and the cervix was prepped with Betadine. A single-toothed tenaculum was placed on the anterior lip of the cervix to stabilize it. The 3 mm pipelle was introduced into the endometrial cavity  without difficulty to a depth of 8cm, and a moderate amount of tissue was obtained and sent to pathology. The instruments were removed from the patient's vagina. Minimal bleeding from the cervix was noted. The patient tolerated the procedure well.  03/06/2013  EXAM:  TRANSVAGINAL ULTRASOUND OF PELVIS  DOPPLER ULTRASOUND OF OVARIES  TECHNIQUE:  Transvaginal ultrasound examination of the pelvis was performed  including evaluation of the uterus, ovaries, adnexal regions, and  pelvic cul-de-sac.  Color and duplex Doppler ultrasound was utilized to evaluate blood  flow to the ovaries.  COMPARISON: 02/25/2013 pelvic ultrasound  FINDINGS:  Uterus: 10 x 5 x 6 cm.  Endometrium: Similar to 02/23/2013 examination, there is extensive  mobile fluid with mid level echoes distending the endometrial canal  and  cervical canal. There is an elongated masslike structure in the  canal without color Doppler flow, measuring at least 5 cm in length  by 1 cm in diameter.  Right ovary: Normal size, 2.4 x 1.9 x 3 cm.  Left ovary: Normal size, 2.4 x 1.9 x 3.3 cm. Spectral flow not  demonstrated within the left ovary due to its position in the  pelvis. Even so, there are no morphologic features suggestive of  torsion.  Pulsed Doppler evaluation demonstrates normal low-resistance  waveforms in the right ovary.  IMPRESSION:  1. Hemorrhage and fluid distends the endometrial and endocervical  canal, similar to 02/23/2013. Elongated masslike structure within  the canal is also again seen, which may represent blood clot, polyp,  or other mass.  2. Technically difficult documentation of ovarian blood flow. No  morphologic findings to suggest ovarian torsion.  CBC    Component  Value  Date/Time    WBC  11.2*  03/06/2013 0033    RBC  4.91  03/06/2013 0033    HGB  12.0  03/06/2013 0033    HCT  36.6  03/06/2013 0033    PLT  276  03/06/2013 0033    MCV  74.5*  03/06/2013 0033    MCH  24.4*  03/06/2013 0033    MCHC  32.8  03/06/2013 0033    RDW  15.4  03/06/2013 0033    LYMPHSABS  2.8  02/25/2013 2038    MONOABS  0.7  02/25/2013 2038    EOSABS  0.3  02/25/2013 2038    BASOSABS  0.0  02/25/2013 2038    03/06/2013 Diagnosis Endometrium, biopsy - PROLIFERATIVE PATTERN ENDOMETRIUM WITH BREAKDOWN. - NO HYPERPLASIA, ATYPIA, OR MALIGNANCY IDENTIFIED. - INCIDENTAL EXTREMELY SCANT, UNREMARKABLE ENDOCERVICAL MUCOSAL FRAGMENTS PRESENT.  Assessment:   Menorrhagia with thickenend endometrium and possible endometrial mass ?polyp vs blood clot.  Plan:   Patient desires surgical management with hysteroscopy, D&C and endometrial ablation.  The risks of surgery were discussed in detail with the patient including but not limited to: bleeding which may require transfusion or reoperation; infection which may require prolonged hospitalization  or re-hospitalization and antibiotic therapy; injury to bowel, bladder, ureters and major vessels or other surrounding organs; need for additional procedures including laparotomy; thromboembolic phenomenon, incisional problems and other postoperative or anesthesia complications.  Patient was told that the likelihood that her condition and symptoms will be treated effectively with this surgical management was very high; the postoperative expectations were also discussed in detail. The patient also understands the alternative treatment options which were discussed in full. All questions were answered.

## 2013-03-23 NOTE — Brief Op Note (Signed)
03/23/2013  5:20 PM  PATIENT:  Diana Schultz  47 y.o. female  PRE-OPERATIVE DIAGNOSIS:  Heavy menstrual bleeding - menorrhagia  POST-OPERATIVE DIAGNOSIS:  Heavy menstrual bleeding - menorrhagia  PROCEDURE:  Procedure(s): DILATATION & CURETTAGE/HYSTEROSCOPY WITH NOVASURE ABLATION (N/A)  SURGEON:  Surgeon(s) and Role:    * Willodean Rosenthal, MD - Primary  ANESTHESIA:   local and general  EBL:  Total I/O In: 1000 [I.V.:1000] Out: -   BLOOD ADMINISTERED:none  DRAINS: none   LOCAL MEDICATIONS USED:  MARCAINE     SPECIMEN:  Source of Specimen:  endometrial curettings  DISPOSITION OF SPECIMEN:  PATHOLOGY  COUNTS:  YES  TOURNIQUET:  * No tourniquets in log *  DICTATION: .Note written in EPIC  PLAN OF CARE: Discharge to home after PACU  PATIENT DISPOSITION:  PACU - hemodynamically stable.   Delay start of Pharmacological VTE agent (>24hrs) due to surgical blood loss or risk of bleeding: not applicable

## 2013-03-23 NOTE — Preoperative (Signed)
Beta Blockers   Reason not to administer Beta Blockers:Not Applicable 

## 2013-03-23 NOTE — Op Note (Signed)
03/23/2013  5:20 PM  PATIENT:  Diana Schultz  47 y.o. female  PRE-OPERATIVE DIAGNOSIS:  Heavy menstrual bleeding - menorrhagia  POST-OPERATIVE DIAGNOSIS:  Heavy menstrual bleeding - menorrhagia  PROCEDURE:  Procedure(s): DILATATION & CURETTAGE/HYSTEROSCOPY WITH NOVASURE ABLATION (N/A)  SURGEON:  Surgeon(s) and Role:    * Willodean Rosenthal, MD - Primary  ANESTHESIA:   local and general  EBL:  Total I/O In: 1000 [I.V.:1000] Out: -   BLOOD ADMINISTERED:none  DRAINS: none   LOCAL MEDICATIONS USED:  MARCAINE     SPECIMEN:  Source of Specimen:  endometrial curettings  DISPOSITION OF SPECIMEN:  PATHOLOGY  COUNTS:  YES  TOURNIQUET:  * No tourniquets in log *  DICTATION: .Note written in EPIC  PLAN OF CARE: Discharge to home after PACU  PATIENT DISPOSITION:  PACU - hemodynamically stable.   Delay start of Pharmacological VTE agent (>24hrs) due to surgical blood loss or risk of bleeding: not applicable   The risks, benefits, and alternatives of surgery were explained, understood, and accepted. The consents were signed and all questions were answered. She was taken to the operating room and general anesthesia was applied without complication. She was placed in the dorsal lithotomy position and her vagina and abdomen were prepped and draped in the usual sterile fashion. A bimanual exam revealed a normal size and shape anteverted mobile uterus. Her adnexa were non-enlarged.   A bivalved speculum was placed in the patients' vagina and the anterior lip of the cervix was grasped with a single toothed tenaculum. A paracervical block was performed at 5 and 7 o'clock with 20cc of 0.5% Marcaine.   The endometrial cavity was sounded to 9cm and the endocervical length measured 3cm. A hysteroscope was inserted and the endometrium was noted to be thickened with several polyps.  The ostia on both sides were noted.  The scope was removed and a sharp currete was used to scape the lining of the  uterus until a gritty texture was noted throughout.  Specimens were sent to pathology.  The NovaSure device was then inserted and seated using 6.0cm as the cavity length and 3.0cm as the cavity width.  The total activation time was 70 sec at a power of 139.  The hysteroscope was reinserted and an even burn pattern was noted to the fundus.  The single toothed tenaculum was removed at the end of the case and no bleeding was noted from the cervix.   The patient was extubated and taken to the recovery room in stable condition.  Sponge, lap and instrument counts were correct.  There were no complications. Tykee Heideman L. Harraway-Smith, M.D., [facility] L. Harraway-Smith, M.D., [facility] Core

## 2013-03-23 NOTE — Anesthesia Postprocedure Evaluation (Signed)
Anesthesia Post Note  Patient: Diana Schultz  Procedure(s) Performed: Procedure(s) (LRB): DILATATION & CURETTAGE/HYSTEROSCOPY WITH NOVASURE ABLATION (N/A)  Anesthesia type: General  Patient location: PACU  Post pain: Pain level controlled  Post assessment: Post-op Vital signs reviewed  Last Vitals:  Filed Vitals:   03/23/13 1745  BP: 144/57  Pulse: 83  Temp:   Resp: 22    Post vital signs: Reviewed  Level of consciousness: sedated  Complications: No apparent anesthesia complications

## 2013-03-26 ENCOUNTER — Encounter (HOSPITAL_COMMUNITY): Payer: Self-pay | Admitting: Obstetrics & Gynecology

## 2013-03-30 ENCOUNTER — Telehealth: Payer: Self-pay | Admitting: General Practice

## 2013-03-30 NOTE — Telephone Encounter (Signed)
Patient called and left message requesting results from biopsy from surgery last Friday. Called patient and informed her of normal tissue results. Patient was satisfied, verbalized understanding and had no further questions

## 2013-04-06 ENCOUNTER — Encounter: Payer: Self-pay | Admitting: Obstetrics & Gynecology

## 2013-04-06 ENCOUNTER — Ambulatory Visit (INDEPENDENT_AMBULATORY_CARE_PROVIDER_SITE_OTHER): Payer: Self-pay | Admitting: Obstetrics & Gynecology

## 2013-04-06 VITALS — BP 127/83 | HR 60 | Temp 97.5°F | Wt 287.2 lb

## 2013-04-06 DIAGNOSIS — Z23 Encounter for immunization: Secondary | ICD-10-CM

## 2013-04-06 DIAGNOSIS — Z9889 Other specified postprocedural states: Secondary | ICD-10-CM

## 2013-04-06 DIAGNOSIS — Z09 Encounter for follow-up examination after completed treatment for conditions other than malignant neoplasm: Secondary | ICD-10-CM

## 2013-04-06 NOTE — Addendum Note (Signed)
Addended by: Faythe Casa on: 04/06/2013 11:31 AM   Modules accepted: Orders

## 2013-04-06 NOTE — Patient Instructions (Signed)
Hysteroscopy Hysteroscopy is a procedure used for looking inside the womb (uterus). It may be done for many different reasons, including:  To evaluate abnormal bleeding, fibroid (benign, noncancerous) tumors, polyps, scar tissue (adhesions), and possibly cancer of the uterus.  To look for lumps (tumors) and other uterine growths.  To look for causes of why a woman cannot get pregnant (infertility), causes of recurrent loss of pregnancy (miscarriages), or a lost intrauterine device (IUD).  To perform a sterilization by blocking the fallopian tubes from inside the uterus. A hysteroscopy should be done right after a menstrual period to be sure you are not pregnant. LET YOUR CAREGIVER KNOW ABOUT:   Allergies.  Medicines taken, including herbs, eyedrops, over-the-counter medicines, and creams.  Use of steroids (by mouth or creams).  Previous problems with anesthetics or numbing medicines.  History of bleeding or blood problems.  History of blood clots.  Possibility of pregnancy, if this applies.  Previous surgery.  Other health problems. RISKS AND COMPLICATIONS   Putting a hole in the uterus.  Excessive bleeding.  Infection.  Damage to the cervix.  Injury to other organs.  Allergic reaction to medicines.  Too much fluid used in the uterus for the procedure. BEFORE THE PROCEDURE   Do not take aspirin or blood thinners for a week before the procedure, or as directed. It can cause bleeding.  Arrive at least 60 minutes before the procedure or as directed to read and sign the necessary forms.  Arrange for someone to take you home after the procedure.  If you smoke, do not smoke for 2 weeks before the procedure. PROCEDURE   Your caregiver may give you medicine to relax you. He or she may also give you a medicine that numbs the area around the cervix (local anesthetic) or a medicine that makes you sleep (general anesthesia).  Sometimes, a medicine is placed in the cervix  the day before the procedure. This medicine makes the cervix have a larger opening (dilate). This makes it easier for the instrument to be inserted into the uterus.  A small instrument (hysteroscope) is inserted through the vagina into the uterus. This instrument is similar to a pencil-sized telescope with a light.  During the procedure, air or a liquid is put into the uterus, which allows the surgeon to see better.  Sometimes, tissue is gently scraped from inside the uterus. These tissue samples are sent to a specialist who looks at tissue samples (pathologist). The pathologist will give a report to your caregiver. This will help your caregiver decide if further treatment is necessary. The report will also help your caregiver decide on the best treatment if the test comes back abnormal. AFTER THE PROCEDURE   If you had a general anesthetic, you may be groggy for a couple hours after the procedure.  If you had a local anesthetic, you will be advised to rest at the surgical center or caregiver's office until you are stable and feel ready to go home.  You may have some cramping for a couple days.  You may have bleeding, which varies from light spotting for a few days to menstrual-like bleeding for up to 3 to 7 days. This is normal.  Have someone take you home. FINDING OUT THE RESULTS OF YOUR TEST Not all test results are available during your visit. If your test results are not back during the visit, make an appointment with your caregiver to find out the results. Do not assume everything is normal if you   have not heard from your caregiver or the medical facility. It is important for you to follow up on all of your test results. HOME CARE INSTRUCTIONS   Do not drive for 24 hours or as instructed.  Only take over-the-counter or prescription medicines for pain, discomfort, or fever as directed by your caregiver.  Do not take aspirin. It can cause or aggravate bleeding.  Do not drive or drink  alcohol while taking pain medicine.  You may resume your usual diet.  Do not use tampons, douche, or have sexual intercourse for 2 weeks, or as advised by your caregiver.  Rest and sleep for the first 24 to 48 hours.  Take your temperature twice a day for 4 to 5 days. Write it down. Give these temperatures to your caregiver if they are abnormal (above 98.6 F or 37.0 C).  Take medicines your caregiver has ordered as directed.  Follow your caregiver's advice regarding diet, exercise, lifting, driving, and general activities.  Take showers instead of baths for 2 weeks, or as recommended by your caregiver.  If you develop constipation:  Take a mild laxative with the advice of your caregiver.  Eat bran foods.  Drink enough water and fluids to keep your urine clear or pale yellow.  Try to have someone with you or available to you for the first 24 to 48 hours, especially if you had a general anesthetic.  Make sure you and your family understand everything about your operation and recovery.  Follow your caregiver's advice regarding follow-up appointments and Pap smears. SEEK MEDICAL CARE IF:   You feel dizzy or lightheaded.  You feel sick to your stomach (nauseous).  You develop abnormal vaginal discharge.  You develop a rash.  You have an abnormal reaction or allergy to your medicine.  You need stronger pain medicine. SEEK IMMEDIATE MEDICAL CARE IF:   Bleeding is heavier than a normal menstrual period or you have blood clots.  You have an oral temperature above 102 F (38.9 C), not controlled by medicine.  You have increasing cramps or pains not relieved with medicine.  You develop belly (abdominal) pain that does not seem to be related to the same area of earlier cramping and pain.  You pass out.  You develop pain in the tops of your shoulders (shoulder strap areas).  You develop shortness of breath. MAKE SURE YOU:   Understand these instructions.  Will watch  your condition.  Will get help right away if you are not doing well or get worse. Document Released: 09/13/2000 Document Revised: 08/30/2011 Document Reviewed: 01/06/2009 [employer] Patient Information 2014 [employer].  

## 2013-04-06 NOTE — Progress Notes (Signed)
Subjective:     Patient ID: Diana Schultz, female   DOB: 12-Oct-1965, 47 y.o.   MRN: [id number]  HPI Pt presents for routine post op check.  She reports that she is feeling great!  She reports that her pain is gone ans she stopped bleeding 1 week after surgery  Review of Systems     Objective:   Physical Exam BP 127/83  Pulse 60  Temp(Src) 97.5 F (36.4 C) (Oral)  Wt 287 lb 3.2 oz (130.273 kg)  BMI 50.89 kg/m2 Pt in NAD Exam deferred    03/23/2013 Diagnosis Endometrium, curettage - BENIGN ENDOMETRIAL POLYP AND ADJACENT BENIGN SECRETORY ENDOMETRIUM. - NO ATYPIA, HYPERPLASIA OR MALIGNANCY. Assessment:     Post op check- doing well      Plan:     Flu shot today F/u in 3 month or sooner prn Nothing per vagina until 4 week post op

## 2013-04-12 ENCOUNTER — Encounter: Payer: Self-pay | Admitting: *Deleted

## 2013-04-17 ENCOUNTER — Other Ambulatory Visit: Payer: Self-pay | Admitting: Obstetrics & Gynecology

## 2013-04-17 DIAGNOSIS — Z1231 Encounter for screening mammogram for malignant neoplasm of breast: Secondary | ICD-10-CM

## 2013-05-01 ENCOUNTER — Ambulatory Visit (HOSPITAL_COMMUNITY)
Admission: RE | Admit: 2013-05-01 | Discharge: 2013-05-01 | Disposition: A | Discharge status: Home / Self Care | Payer: Self-pay | Source: Ambulatory Visit | Attending: Obstetrics & Gynecology | Admitting: Obstetrics & Gynecology

## 2013-05-01 DIAGNOSIS — Z1231 Encounter for screening mammogram for malignant neoplasm of breast: Secondary | ICD-10-CM

## 2013-05-02 LAB — HM MAMMOGRAPHY: HM MAMMO: NEGATIVE

## 2013-05-25 ENCOUNTER — Ambulatory Visit: Payer: Self-pay | Admitting: Internal Medicine

## 2013-08-17 ENCOUNTER — Ambulatory Visit: Payer: Self-pay | Admitting: Internal Medicine

## 2013-08-28 ENCOUNTER — Telehealth: Payer: Self-pay

## 2013-08-28 NOTE — Telephone Encounter (Addendum)
Medication and allergies:  Allergies reviewed. Does not currently take any medications.   90 day supply/mail order: na  Local pharmacy: [facility]   Immunizations due:  Possibly Tdap (will look at records)  A/P:   Entered FH, PSH and Personal Hx Flu vaccine--03/2013 Tdap--unsure Pap--within last year--normal MMG--04/2013--neg Bone Density--hasn't had CCS--had one 6-7 years ago--neg--mild diverticulitis(provider in [place]) Most recent PCP was at a "free clinic" in [place]  To Discuss with Provider: Not at this time

## 2013-08-29 ENCOUNTER — Telehealth: Payer: Self-pay | Admitting: Internal Medicine

## 2013-08-29 ENCOUNTER — Encounter: Payer: Self-pay | Admitting: Internal Medicine

## 2013-08-29 ENCOUNTER — Ambulatory Visit (INDEPENDENT_AMBULATORY_CARE_PROVIDER_SITE_OTHER): Payer: 59 | Admitting: Internal Medicine

## 2013-08-29 VITALS — BP 131/87 | HR 78 | Temp 97.7°F | Ht 63.2 in | Wt 286.0 lb

## 2013-08-29 DIAGNOSIS — R739 Hyperglycemia, unspecified: Secondary | ICD-10-CM

## 2013-08-29 DIAGNOSIS — E1159 Type 2 diabetes mellitus with other circulatory complications: Secondary | ICD-10-CM | POA: Insufficient documentation

## 2013-08-29 DIAGNOSIS — J069 Acute upper respiratory infection, unspecified: Secondary | ICD-10-CM

## 2013-08-29 DIAGNOSIS — I10 Essential (primary) hypertension: Secondary | ICD-10-CM | POA: Insufficient documentation

## 2013-08-29 DIAGNOSIS — R7309 Other abnormal glucose: Secondary | ICD-10-CM

## 2013-08-29 MED ORDER — AMOXICILLIN 500 MG PO CAPS: 1000.0000 mg | ORAL_CAPSULE | Freq: Two times a day (BID) | ORAL | Status: DC

## 2013-08-29 NOTE — Patient Instructions (Signed)
Get your blood work before you leave   Next visit is for a physical exam in 2-3 months, fasting Please make an appointment     Rest, fluids , tylenol For cough, take Mucinex DM twice a day as needed  For congestion use OTC Nasocort: 2 nasal sprays on each side of the nose daily until you feel better  Take the antibiotic as prescribed  (Amoxicillin) if not better in 2-3 days  Call anytime if the symptoms are severe, you have high fever, short of breath, chest pain

## 2013-08-29 NOTE — Assessment & Plan Note (Signed)
History of hypertension, on no medications for about a year, she decided to exercise, watch her diet, has lost 10 pounds. Ambulatory BPs are very good.

## 2013-08-29 NOTE — Progress Notes (Signed)
Subjective:    Patient ID: Diana Schultz, female    DOB: 1966/06/03, 48 y.o.   MRN: [id number]  DOS:  08/29/2013 Type of  visit: New patient, to get established. Hypertension, off medications for a year, see assessment and plan 2 days history of sinus congestion, cough, dizziness, frontal headache, feeling fatigued. Taking OTCs. She is also somehow concerned about her blood sugars that have been elevated a couple of times.   ROS No fever or chills Mild sore throat Occasional yellow sputum production.+ Sneezing. No chest pain or difficulty breathing.  Past Medical History  Diagnosis Date  . Ovarian cyst   . Umbilical hernia   . Hypertension     history,lost weight, controlled with diet and exercise, no current med  . DVT (deep venous thrombosis)     Hx at age 64 blood clot in right leg, d/c birth control pills and no other problems    Past Surgical History  Procedure Laterality Date  . Tubal ligation    . Cholecystectomy    . Appendectomy    . Leep    . Dilation and curettage of uterus    . Dilitation & currettage/hystroscopy with novasure ablation N/A 03/23/2013    Procedure: DILATATION & CURETTAGE/HYSTEROSCOPY WITH NOVASURE ABLATION;  Surgeon: Lavonia Drafts, MD;  Location: [facility];  Service: Gynecology;  Laterality: N/A;    History   Social History  . Marital Status: Married    Spouse Name: N/A    Number of Children: 2  . Years of Education: N/A   Occupational History  . CNA, med tech  by training, does prn works    Social History Main Topics  . Smoking status: Never Smoker   . Smokeless tobacco: Never Used  . Alcohol Use: No     Comment: rare   . Drug Use: No  . Sexual Activity: Not Currently    Birth Control/ Protection: Surgical   Other Topics Concern  . Not on file   Social History Narrative   Original from [place]     Family History  Problem Relation Age of Onset  . Cancer Mother     type? ovarian?  Marland Kitchen Hypertension Mother   . Heart failure  Paternal Grandfather   . Diabetes Mother   . CAD Other     GP, late in like  . Stroke Mother     early in life  . Colon cancer Neg Hx   . Breast cancer Neg Hx        Medication List       This list is accurate as of: 08/29/13  6:19 PM.  Always use your most recent med list.               amoxicillin 500 MG capsule  Commonly known as:  AMOXIL  Take 2 capsules (1,000 mg total) by mouth 2 (two) times daily.           Objective:   Physical Exam BP 131/87  Pulse 78  Temp(Src) 97.7 F (36.5 C)  Ht 5' 3.2" (1.605 m)  Wt 286 lb (129.729 kg)  BMI 50.36 kg/m2  SpO2 95% General -- alert, well-developed, NAD.  HEENT-- Not pale. TMs normal, throat symmetric, no redness or discharge. Face symmetric, sinuses slt tender to palpation (B frontal sinuses). Nose slt congested.  Lungs -- normal respiratory effort, no intercostal retractions, no accessory muscle use, and normal breath sounds.  Heart-- normal rate, regular rhythm, no murmur.   Extremities-- no pretibial  edema bilaterally  Neurologic--  alert & oriented X3. Speech normal, gait normal, strength normal in all extremities.  Psych-- Cognition and judgment appear intact. Cooperative with normal attention span and concentration. No anxious or depressed appearing.      Assessment & Plan:   URI, see instructions

## 2013-08-29 NOTE — Assessment & Plan Note (Signed)
Available CBGs   Reviewed >>> slightly elevated, check a  A1c

## 2013-08-29 NOTE — Telephone Encounter (Signed)
Relevant patient education assigned to patient using Emmi. ° °

## 2013-08-29 NOTE — Progress Notes (Signed)
Pre visit review using our clinic review tool, if applicable. No additional management support is needed unless otherwise documented below in the visit note. 

## 2013-08-30 LAB — HEMOGLOBIN A1C: Hgb A1c MFr Bld: 6.2 % (ref 4.6–6.5)

## 2013-08-31 ENCOUNTER — Telehealth: Payer: Self-pay | Admitting: Internal Medicine

## 2013-08-31 NOTE — Telephone Encounter (Signed)
Pt notified. Agrees with plan.

## 2013-08-31 NOTE — Telephone Encounter (Signed)
Patient called to check to see if her lab was back yet.

## 2013-08-31 NOTE — Telephone Encounter (Signed)
Advise patient, her blood sugar test (A1C) showed "pre- diabetes " or "mild diabetes". She does not need any medication at this time, I do recommend her to continue improving her lifestyle and losing weight. We need to recheck this test when she comes back in 3 months.

## 2013-10-24 ENCOUNTER — Encounter: Payer: Self-pay | Admitting: *Deleted

## 2013-10-24 ENCOUNTER — Encounter: Payer: Self-pay | Admitting: Internal Medicine

## 2013-10-31 ENCOUNTER — Ambulatory Visit (INDEPENDENT_AMBULATORY_CARE_PROVIDER_SITE_OTHER): Payer: 59 | Admitting: Internal Medicine

## 2013-10-31 VITALS — BP 134/83 | HR 75 | Temp 98.2°F | Ht 63.5 in | Wt 287.0 lb

## 2013-10-31 DIAGNOSIS — I10 Essential (primary) hypertension: Secondary | ICD-10-CM

## 2013-10-31 DIAGNOSIS — R7309 Other abnormal glucose: Secondary | ICD-10-CM

## 2013-10-31 DIAGNOSIS — R739 Hyperglycemia, unspecified: Secondary | ICD-10-CM

## 2013-10-31 DIAGNOSIS — Z0000 Encounter for general adult medical examination without abnormal findings: Secondary | ICD-10-CM | POA: Insufficient documentation

## 2013-10-31 NOTE — Progress Notes (Signed)
Subjective:    Patient ID: Diana Schultz, female    DOB: 05-Jul-1965, 48 y.o.   MRN: [id number]  DOS:  10/31/2013 Type of  visit:  CPX   ROS No  CP, SOB Denies  nausea, vomiting diarrhea Denies  blood in the stools No GERD  Sx. No dysuria, gross hematuria, difficulty urinating  On-55ff anxiety, depression----> controlled -improved  w/ exercise    Past Medical History  Diagnosis Date  . Ovarian cyst   . Umbilical hernia   . Hypertension     history,lost weight, controlled with diet and exercise, no current med  . DVT (deep venous thrombosis)     Hx at age 46 blood clot in right leg, d/c birth control pills and no other problems  . Depression   . Diverticulitis   . GERD (gastroesophageal reflux disease)   . Insulin resistance     Past Surgical History  Procedure Laterality Date  . Tubal ligation    . Cholecystectomy    . Appendectomy    . Leep    . Dilation and curettage of uterus    . Dilitation & currettage/hystroscopy with novasure ablation N/A 03/23/2013    Procedure: DILATATION & CURETTAGE/HYSTEROSCOPY WITH NOVASURE ABLATION;  Surgeon: Lavonia Drafts, MD;  Location: [facility];  Service: Gynecology;  Laterality: N/A;    History   Social History  . Marital Status: Married    Spouse Name: N/A    Number of Children: 2  . Years of Education: N/A   Occupational History  . CNA, med tech  by training, does prn works    Social History Main Topics  . Smoking status: Never Smoker   . Smokeless tobacco: Never Used  . Alcohol Use: No     Comment: rare   . Drug Use: No  . Sexual Activity: Not on file   Other Topics Concern  . Not on file   Social History Narrative   Original from [place]     Family History  Problem Relation Age of Onset  . Cancer Mother     type? ovarian?  Marland Kitchen Hypertension Mother   . Heart failure Paternal Grandfather   . Diabetes Mother   . CAD Other     GP, late in like  . Stroke Mother     early in life  . Colon cancer Neg Hx   .  Breast cancer Neg Hx         Medication List       This list is accurate as of: 10/31/13 11:59 PM.  Always use your most recent med list.               amoxicillin 500 MG capsule  Commonly known as:  AMOXIL  Take 2 capsules (1,000 mg total) by mouth 2 (two) times daily.           Objective:   Physical Exam BP 134/83  Pulse 75  Temp(Src) 98.2 F (36.8 C)  Ht 5' 3.5" (1.613 m)  Wt 287 lb (130.182 kg)  BMI 50.04 kg/m2  SpO2 97% General -- alert, well-developed, NAD.  Neck --no thyromegaly  HEENT-- Not pale.   Lungs -- normal respiratory effort, no intercostal retractions, no accessory muscle use, and normal breath sounds.  Heart-- normal rate, regular rhythm, no murmur.  Abdomen-- Not distended, good bowel sounds,soft, non-tender.  Extremities-- no pretibial edema bilaterally  Neurologic--  alert & oriented X3. Speech normal, gait normal, strength normal in all extremities.  Psych-- Cognition  and judgment appear intact. Cooperative with normal attention span and concentration. No anxious or depressed appearing.         Assessment & Plan:

## 2013-10-31 NOTE — Patient Instructions (Signed)
Come  back fasting for labs TSH, FLP --- dx v70  Next visit in 4 months

## 2013-11-01 ENCOUNTER — Ambulatory Visit (HOSPITAL_COMMUNITY)
Admission: AD | Admit: 2013-11-01 | Discharge: 2013-11-01 | Disposition: A | Discharge status: Home / Self Care | Payer: 59 | Source: Ambulatory Visit | Attending: Obstetrics & Gynecology | Admitting: Obstetrics & Gynecology

## 2013-11-01 ENCOUNTER — Encounter (HOSPITAL_COMMUNITY): Payer: Self-pay

## 2013-11-01 ENCOUNTER — Encounter (HOSPITAL_COMMUNITY)
Admission: AD | Disposition: A | Discharge status: Home / Self Care | Payer: Self-pay | Source: Ambulatory Visit | Attending: Obstetrics & Gynecology

## 2013-11-01 ENCOUNTER — Encounter (HOSPITAL_COMMUNITY): Payer: 59 | Admitting: Anesthesiology

## 2013-11-01 ENCOUNTER — Inpatient Hospital Stay (HOSPITAL_COMMUNITY): Payer: 59

## 2013-11-01 ENCOUNTER — Inpatient Hospital Stay (HOSPITAL_COMMUNITY): Payer: 59 | Admitting: Anesthesiology

## 2013-11-01 DIAGNOSIS — Z888 Allergy status to other drugs, medicaments and biological substances status: Secondary | ICD-10-CM | POA: Diagnosis not present

## 2013-11-01 DIAGNOSIS — K219 Gastro-esophageal reflux disease without esophagitis: Secondary | ICD-10-CM | POA: Diagnosis not present

## 2013-11-01 DIAGNOSIS — I10 Essential (primary) hypertension: Secondary | ICD-10-CM | POA: Diagnosis not present

## 2013-11-01 DIAGNOSIS — Z86718 Personal history of other venous thrombosis and embolism: Secondary | ICD-10-CM | POA: Insufficient documentation

## 2013-11-01 DIAGNOSIS — N857 Hematometra: Secondary | ICD-10-CM | POA: Diagnosis not present

## 2013-11-01 DIAGNOSIS — F3289 Other specified depressive episodes: Secondary | ICD-10-CM | POA: Diagnosis not present

## 2013-11-01 DIAGNOSIS — F329 Major depressive disorder, single episode, unspecified: Secondary | ICD-10-CM | POA: Diagnosis not present

## 2013-11-01 DIAGNOSIS — N840 Polyp of corpus uteri: Secondary | ICD-10-CM

## 2013-11-01 DIAGNOSIS — Z6841 Body Mass Index (BMI) 40.0 and over, adult: Secondary | ICD-10-CM | POA: Insufficient documentation

## 2013-11-01 DIAGNOSIS — Z9889 Other specified postprocedural states: Secondary | ICD-10-CM

## 2013-11-01 HISTORY — PX: DILATION AND CURETTAGE OF UTERUS: SHX78

## 2013-11-01 LAB — URINALYSIS, ROUTINE W REFLEX MICROSCOPIC
BILIRUBIN URINE: NEGATIVE
Glucose, UA: NEGATIVE mg/dL
Hgb urine dipstick: NEGATIVE
KETONES UR: NEGATIVE mg/dL
LEUKOCYTES UA: NEGATIVE
NITRITE: NEGATIVE
PROTEIN: NEGATIVE mg/dL
Specific Gravity, Urine: 1.015 (ref 1.005–1.030)
Urobilinogen, UA: 0.2 mg/dL (ref 0.0–1.0)
pH: 6.5 (ref 5.0–8.0)

## 2013-11-01 LAB — CBC
HEMATOCRIT: 38.8 % (ref 36.0–46.0)
Hemoglobin: 12.7 g/dL (ref 12.0–15.0)
MCH: 24.9 pg — ABNORMAL LOW (ref 26.0–34.0)
MCHC: 32.7 g/dL (ref 30.0–36.0)
MCV: 75.9 fL — ABNORMAL LOW (ref 78.0–100.0)
Platelets: 208 10*3/uL (ref 150–400)
RBC: 5.11 MIL/uL (ref 3.87–5.11)
RDW: 15.2 % (ref 11.5–15.5)
WBC: 10.3 10*3/uL (ref 4.0–10.5)

## 2013-11-01 LAB — GLUCOSE, CAPILLARY: GLUCOSE-CAPILLARY: 110 mg/dL — AB (ref 70–99)

## 2013-11-01 LAB — POCT PREGNANCY, URINE: PREG TEST UR: NEGATIVE

## 2013-11-01 SURGERY — DILATION AND CURETTAGE
Anesthesia: General | Site: Uterus

## 2013-11-01 MED ORDER — FENTANYL CITRATE 0.05 MG/ML IJ SOLN
INTRAMUSCULAR | Status: AC
  Filled 2013-11-01: qty 5

## 2013-11-01 MED ORDER — MIDAZOLAM HCL 2 MG/2ML IJ SOLN
INTRAMUSCULAR | Status: AC
  Filled 2013-11-01: qty 2

## 2013-11-01 MED ORDER — HYDROMORPHONE HCL PF 2 MG/ML IJ SOLN
2.0000 mg | Freq: Once | INTRAMUSCULAR | Status: AC
  Administered 2013-11-01: 2 mg via INTRAMUSCULAR
  Filled 2013-11-01: qty 1

## 2013-11-01 MED ORDER — PROPOFOL 10 MG/ML IV BOLUS
INTRAVENOUS | Status: DC | PRN
  Administered 2013-11-01: 200 mg via INTRAVENOUS
  Administered 2013-11-01: 50 mg via INTRAVENOUS

## 2013-11-01 MED ORDER — CHLOROPROCAINE HCL 3 % IJ SOLN
INTRAMUSCULAR | Status: DC | PRN
  Administered 2013-11-01: 5 mL

## 2013-11-01 MED ORDER — FENTANYL CITRATE 0.05 MG/ML IJ SOLN: 25.0000 ug | INTRAMUSCULAR | Status: DC | PRN

## 2013-11-01 MED ORDER — HYDROMORPHONE HCL PF 1 MG/ML IJ SOLN
2.0000 mg | Freq: Once | INTRAMUSCULAR | Status: AC
  Administered 2013-11-01: 2 mg via INTRAVENOUS
  Filled 2013-11-01: qty 2

## 2013-11-01 MED ORDER — PROMETHAZINE HCL 25 MG/ML IJ SOLN
6.2500 mg | INTRAMUSCULAR | Status: DC | PRN
  Administered 2013-11-01: 6.25 mg via INTRAVENOUS

## 2013-11-01 MED ORDER — MIDAZOLAM HCL 2 MG/2ML IJ SOLN: 0.5000 mg | Freq: Once | INTRAMUSCULAR | Status: DC | PRN

## 2013-11-01 MED ORDER — SUCCINYLCHOLINE CHLORIDE 20 MG/ML IJ SOLN
INTRAMUSCULAR | Status: DC | PRN
  Administered 2013-11-01: 120 mg via INTRAVENOUS

## 2013-11-01 MED ORDER — LACTATED RINGERS IV SOLN
INTRAVENOUS | Status: DC
  Administered 2013-11-01 (×3): via INTRAVENOUS

## 2013-11-01 MED ORDER — MIDAZOLAM HCL 2 MG/2ML IJ SOLN
INTRAMUSCULAR | Status: DC | PRN
  Administered 2013-11-01: 2 mg via INTRAVENOUS

## 2013-11-01 MED ORDER — OXYCODONE-ACETAMINOPHEN 5-325 MG PO TABS
2.0000 | ORAL_TABLET | Freq: Once | ORAL | Status: AC
  Administered 2013-11-01: 2 via ORAL
  Filled 2013-11-01: qty 2

## 2013-11-01 MED ORDER — DIPHENHYDRAMINE HCL 50 MG/ML IJ SOLN
25.0000 mg | Freq: Once | INTRAMUSCULAR | Status: AC
  Administered 2013-11-01: 25 mg via INTRAVENOUS
  Filled 2013-11-01: qty 1

## 2013-11-01 MED ORDER — OXYCODONE-ACETAMINOPHEN 5-325 MG PO TABS: 1.0000 | ORAL_TABLET | Freq: Four times a day (QID) | ORAL | Status: DC | PRN

## 2013-11-01 MED ORDER — KETOROLAC TROMETHAMINE 30 MG/ML IJ SOLN: 15.0000 mg | Freq: Once | INTRAMUSCULAR | Status: DC | PRN

## 2013-11-01 MED ORDER — IBUPROFEN 600 MG PO TABS: 600.0000 mg | ORAL_TABLET | Freq: Four times a day (QID) | ORAL | Status: DC | PRN

## 2013-11-01 MED ORDER — PROMETHAZINE HCL 25 MG/ML IJ SOLN
INTRAMUSCULAR | Status: AC
  Administered 2013-11-01: 6.25 mg via INTRAVENOUS
  Filled 2013-11-01: qty 1

## 2013-11-01 MED ORDER — MEPERIDINE HCL 25 MG/ML IJ SOLN: 6.2500 mg | INTRAMUSCULAR | Status: DC | PRN

## 2013-11-01 MED ORDER — ONDANSETRON HCL 4 MG/2ML IJ SOLN
4.0000 mg | Freq: Once | INTRAMUSCULAR | Status: AC
  Administered 2013-11-01: 4 mg via INTRAVENOUS
  Filled 2013-11-01: qty 2

## 2013-11-01 MED ORDER — FENTANYL CITRATE 0.05 MG/ML IJ SOLN
INTRAMUSCULAR | Status: DC | PRN
  Administered 2013-11-01 (×2): 100 ug via INTRAVENOUS

## 2013-11-01 MED ORDER — KETOROLAC TROMETHAMINE 30 MG/ML IJ SOLN: 30.0000 mg | Freq: Once | INTRAMUSCULAR | Status: DC

## 2013-11-01 MED ORDER — FAMOTIDINE IN NACL 20-0.9 MG/50ML-% IV SOLN
20.0000 mg | Freq: Once | INTRAVENOUS | Status: AC
  Administered 2013-11-01: 20 mg via INTRAVENOUS
  Filled 2013-11-01: qty 50

## 2013-11-01 SURGICAL SUPPLY — 15 items
CATH ROBINSON RED A/P 16FR (CATHETERS) ×4 IMPLANT
DECANTER SPIKE VIAL GLASS SM (MISCELLANEOUS) ×4 IMPLANT
GLOVE BIOGEL PI IND STRL 6.5 (GLOVE) ×2 IMPLANT
GLOVE BIOGEL PI INDICATOR 6.5 (GLOVE) ×2
GLOVE SURG SS PI 6.0 STRL IVOR (GLOVE) ×4 IMPLANT
GOWN STRL REUS W/TWL LRG LVL3 (GOWN DISPOSABLE) ×8 IMPLANT
KIT BERKELEY 1ST TRIMESTER 3/8 (MISCELLANEOUS) ×4 IMPLANT
NEEDLE SPNL 22GX3.5 QUINCKE BK (NEEDLE) ×4 IMPLANT
NS IRRIG 1000ML POUR BTL (IV SOLUTION) ×4 IMPLANT
PACK VAGINAL MINOR WOMEN LF (CUSTOM PROCEDURE TRAY) ×4 IMPLANT
PAD OB MATERNITY 4.3X12.25 (PERSONAL CARE ITEMS) ×4 IMPLANT
PAD PREP 24X48 CUFFED NSTRL (MISCELLANEOUS) ×4 IMPLANT
SET BERKELEY SUCTION TUBING (SUCTIONS) ×4 IMPLANT
SYR CONTROL 10ML LL (SYRINGE) ×4 IMPLANT
TOWEL OR 17X24 6PK STRL BLUE (TOWEL DISPOSABLE) ×8 IMPLANT

## 2013-11-01 NOTE — Anesthesia Procedure Notes (Signed)
Procedure Name: Intubation Date/Time: 11/01/2013 1:46 PM Performed by: [employer], Sheron Nightingale Pre-anesthesia Checklist: Patient identified, Emergency Drugs available, Suction available, Patient being monitored and Timeout performed Patient Re-evaluated:Patient Re-evaluated prior to inductionOxygen Delivery Method: Circle system utilized Preoxygenation: Pre-oxygenation with 100% oxygen Intubation Type: IV induction Ventilation: Mask ventilation without difficulty Tube type: Oral Tube size: 7.0 mm Number of attempts: 1 Airway Equipment and Method: Patient positioned with wedge pillow Placement Confirmation: ETT inserted through vocal cords under direct vision and breath sounds checked- equal and bilateral Secured at: 21 cm Dental Injury: Teeth and Oropharynx as per pre-operative assessment

## 2013-11-01 NOTE — Transfer of Care (Signed)
Immediate Anesthesia Transfer of Care Note  Patient: Diana Schultz  Procedure(s) Performed: Procedure(s): DILATATION AND CURETTAGE (N/A)  Patient Location: PACU  Anesthesia Type:General  Level of Consciousness: awake, alert  and sedated  Airway & Oxygen Therapy: Patient Spontanous Breathing and Patient connected to nasal cannula oxygen  Post-op Assessment: Report given to PACU RN and Post -op Vital signs reviewed and stable  Post vital signs: Reviewed and stable  Complications: No apparent anesthesia complications

## 2013-11-01 NOTE — H&P (Signed)
Chief Complaint: Abdominal pain   SUBJECTIVE  HPI: Diana Schultz is a 48 y.o. [id number] female who presents with severe right lower quadrant pain that woke her from her sleep tonight. History of similar pain last fall. Resolved after D&C/polypectomy and endometrial ablation. Has not had periods since then. Describes pain as sharp, Kristee Angus, 10+/10 on pain scale. Has not tried any thing for the pain because she came straight to maternity admissions.  Past Medical History   Diagnosis  Date   .  Ovarian cyst    .  Umbilical hernia    .  Hypertension      history,lost weight, controlled with diet and exercise, no current med   .  DVT (deep venous thrombosis)      Hx at age 26 blood clot in right leg, d/c birth control pills and no other problems   .  Depression    .  Diverticulitis    .  GERD (gastroesophageal reflux disease)    .  Insulin resistance     OB History   Gravida  Para  Term  Preterm  AB  SAB  TAB  Ectopic  Multiple  Living   2  2  2        2      #  Outcome  Date  GA  Lbr Len/2nd  Weight  Sex  Delivery  Anes  PTL  Lv   2  TRM            1  TRM               Past Surgical History   Procedure  Laterality  Date   .  Tubal ligation     .  Cholecystectomy     .  Appendectomy     .  Leep     .  Dilation and curettage of uterus     .  Dilitation & currettage/hystroscopy with novasure ablation  N/A  03/23/2013     Procedure: DILATATION & CURETTAGE/HYSTEROSCOPY WITH NOVASURE ABLATION; Surgeon: Lavonia Drafts, MD; Location: [facility]; Service: Gynecology; Laterality: N/A;    History    Social History   .  Marital Status:  Married     Spouse Name:  N/A     Number of Children:  2   .  Years of Education:  N/A    Occupational History   .  CNA, med tech by training, does prn works     Social History Main Topics   .  Smoking status:  Never Smoker   .  Smokeless tobacco:  Never Used   .  Alcohol Use:  No      Comment: rare   .  Drug Use:  No   .  Sexual Activity:  Not  Currently     Birth Control/ Protection:  Surgical    Other Topics  Concern   .  Not on file    Social History Narrative    Original from [place]    No current facility-administered medications on file prior to encounter.    Current Outpatient Prescriptions on File Prior to Encounter   Medication  Sig  Dispense  Refill   .  amoxicillin (AMOXIL) 500 MG capsule  Take 2 capsules (1,000 mg total) by mouth 2 (two) times daily.  28 capsule  0    Allergies   Allergen  Reactions   .  Benicar [Olmesartan]  Cough   .  Lisinopril  Cough  ROS: Pertinent positive items in HPI. Also positive for nausea and vomiting one week ago. None since. Negative for fever, chills, diarrhea, constipation, dysuria, hematuria, urgency, frequency, flank pain, vaginal discharge, vaginal bleeding.  OBJECTIVE  Blood pressure 112/82, pulse 67, temperature 97.6 F (36.4 C), temperature source Oral, resp. rate 22, height 5' 3.5" (1.613 m), weight 130.817 kg (288 lb 6.4 oz), SpO2 96.00%.  GENERAL: Well-developed, well-nourished, obese female in severe distress. Lying on her side clutching her low abdomen.  HEENT: Normocephalic  HEART: normal rate  RESP: normal effort  ABDOMEN: Soft, moderate suprapubic tenderness. Positive bowel sounds x4. Negative CVA tenderness.  EXTREMITIES: Nontender, no edema  NEURO: Alert and oriented  SPECULUM EXAM: Deferred. Started having small amount of dark red bleeding during MAU visit.  LAB RESULTS  Results for orders placed during the hospital encounter of 11/01/13 (from the past 24 hour(s))   URINALYSIS, ROUTINE W REFLEX MICROSCOPIC Status: None    Collection Time    11/01/13 3:52 AM   Result  Value  Ref Range    Color, Urine  YELLOW  YELLOW    APPearance  CLEAR  CLEAR    Specific Gravity, Urine  1.015  1.005 - 1.030    pH  6.5  5.0 - 8.0    Glucose, UA  NEGATIVE  NEGATIVE mg/dL    Hgb urine dipstick  NEGATIVE  NEGATIVE    Bilirubin Urine  NEGATIVE  NEGATIVE    Ketones, ur   NEGATIVE  NEGATIVE mg/dL    Protein, ur  NEGATIVE  NEGATIVE mg/dL    Urobilinogen, UA  0.2  0.0 - 1.0 mg/dL    Nitrite  NEGATIVE  NEGATIVE    Leukocytes, UA  NEGATIVE  NEGATIVE   POCT PREGNANCY, URINE Status: None    Collection Time    11/01/13 4:03 AM   Result  Value  Ref Range    Preg Test, Ur  NEGATIVE  NEGATIVE   CBC Status: Abnormal    Collection Time    11/01/13 4:20 AM   Result  Value  Ref Range    WBC  10.3  4.0 - 10.5 K/uL    RBC  5.11  3.87 - 5.11 MIL/uL    Hemoglobin  12.7  12.0 - 15.0 g/dL    HCT  38.8  36.0 - 46.0 %    MCV  75.9 (*)  78.0 - 100.0 fL    MCH  24.9 (*)  26.0 - 34.0 pg    MCHC  32.7  30.0 - 36.0 g/dL    RDW  15.2  11.5 - 15.5 %    Platelets  208  150 - 400 K/uL    IMAGING  US Transvaginal Non-ob  11/01/2013 CLINICAL DATA: Right lower quadrant pain. History of ablation 02/2013 EXAM: TRANSVAGINAL ULTRASOUND OF PELVIS TECHNIQUE: Transvaginal ultrasound examination of the pelvis was performed including evaluation of the uterus, ovaries, adnexal regions, and pelvic cul-de-sac. COMPARISON: US TRANSVAGINAL NON-OB dated 03/06/2013 FINDINGS: Uterus Measurements: 9.8 x 5.7 x 5.6 cm, anteverted. No fibroids or other mass visualized. Endometrium Thickness: 4.8 mm. Complex fluid collection demonstrated in the endometrium with ovoid solid component suggesting endometrial mass or polyp. Appearances are similar to prior study. Right ovary Measurements: 2.5 x 1.4 x 1.9 cm. Normal appearance/no adnexal mass. Left ovary Measurements: 1.9 x 1.2 x 1.4 cm. Normal appearance/no adnexal mass. Other findings: No free fluid IMPRESSION: Persistent finding of complex fluid collection in the endometrium with a solid polypoid component suggesting endometrial mass  or polyp. Ovaries are unremarkable. Electronically Signed By: Lucienne Capers M.D. On: 11/01/2013 05:52  US Pelvis Complete  11/01/2013 CLINICAL DATA: Right lower quadrant pain. History of ablation 02/2013 EXAM: TRANSVAGINAL ULTRASOUND  OF PELVIS TECHNIQUE: Transvaginal ultrasound examination of the pelvis was performed including evaluation of the uterus, ovaries, adnexal regions, and pelvic cul-de-sac. COMPARISON: US TRANSVAGINAL NON-OB dated 03/06/2013 FINDINGS: Uterus Measurements: 9.8 x 5.7 x 5.6 cm, anteverted. No fibroids or other mass visualized. Endometrium Thickness: 4.8 mm. Complex fluid collection demonstrated in the endometrium with ovoid solid component suggesting endometrial mass or polyp. Appearances are similar to prior study. Right ovary Measurements: 2.5 x 1.4 x 1.9 cm. Normal appearance/no adnexal mass. Left ovary Measurements: 1.9 x 1.2 x 1.4 cm. Normal appearance/no adnexal mass. Other findings: No free fluid IMPRESSION: Persistent finding of complex fluid collection in the endometrium with a solid polypoid component suggesting endometrial mass or polyp. Ovaries are unremarkable. Electronically Signed By: Lucienne Capers M.D. On: 11/01/2013 05:52  MAU COURSE  CBC, UA, UPT, pelvic ultrasound and Dilaudid ordered.  Significant improvement in pain with Dilaudid, but then patient vomited. Pain increased to 5-6 on pain scale. Requesting pain medication, but reluctant to go out again. Percocet ordered.  ASSESSMENT  1.  Hematometra   2.  Uterine polyp   3.  S/P endometrial ablation in 03/2013   PLAN  Reviewed ultrasound results with patient with concerns for hematometra. Offered cervical dilation in MAU vs D&C in OR. Patient opted to go to the operating room. Risks, benefits and alternatives were explained including but not limited to risks of bleeding, infection, uterine perforation and damage to adjacent organs. Patient verbalized understanding and all questions were answered.

## 2013-11-01 NOTE — Assessment & Plan Note (Addendum)
  Td ~ 2014 Never cscope, discussed different ways to do it, elected a Cscope  Son has Chron's dz  Sees gyn, needs a referral Dr Ihor Dow [facility] [phone]  Diet- exercise-- improved but no wt loss. rec calorie counting Check a TSH FLP RTC 4 months Multiple records reviewed, some will be kept others will be sent back to the patient for safekeeping

## 2013-11-01 NOTE — MAU Provider Note (Signed)
Chief Complaint: No chief complaint on file.   First Provider Initiated Contact with Patient 11/01/13 0423     SUBJECTIVE HPI: Diana Schultz is a 48 y.o. G69P2002 female who presents with severe right lower quadrant pain that woke her from her sleep tonight. History of similar pain last fall. Resolved after D&C/polypectomy and endometrial ablation. Has not had periods since then. Describes pain as sharp, constant, 10+/10 on pain scale. Has not tried any thing for the pain because she came straight to maternity admissions.  Past Medical History  Diagnosis Date  . Ovarian cyst   . Umbilical hernia   . Hypertension     history,lost weight, controlled with diet and exercise, no current med  . DVT (deep venous thrombosis)     Hx at age 48 blood clot in right leg, d/c birth control pills and no other problems  . Depression   . Diverticulitis   . GERD (gastroesophageal reflux disease)   . Insulin resistance    OB History  Gravida Para Term Preterm AB SAB TAB Ectopic Multiple Living  2 2 2       2     # Outcome Date GA Lbr Len/2nd Weight Sex Delivery Anes PTL Lv  2 TRM           1 TRM              Past Surgical History  Procedure Laterality Date  . Tubal ligation    . Cholecystectomy    . Appendectomy    . Leep    . Dilation and curettage of uterus    . Dilitation & currettage/hystroscopy with novasure ablation N/A 03/23/2013    Procedure: DILATATION & CURETTAGE/HYSTEROSCOPY WITH NOVASURE ABLATION;  Surgeon: Lavonia Drafts, MD;  Location: [facility];  Service: Gynecology;  Laterality: N/A;   History   Social History  . Marital Status: Married    Spouse Name: N/A    Number of Children: 2  . Years of Education: N/A   Occupational History  . CNA, med tech  by training, does prn works    Social History Main Topics  . Smoking status: Never Smoker   . Smokeless tobacco: Never Used  . Alcohol Use: No     Comment: rare   . Drug Use: No  . Sexual Activity: Not Currently     Birth Control/ Protection: Surgical   Other Topics Concern  . Not on file   Social History Narrative   Original from [place]   No current facility-administered medications on file prior to encounter.   Current Outpatient Prescriptions on File Prior to Encounter  Medication Sig Dispense Refill  . amoxicillin (AMOXIL) 500 MG capsule Take 2 capsules (1,000 mg total) by mouth 2 (two) times daily.  28 capsule  0   Allergies  Allergen Reactions  . Benicar [Olmesartan] Cough  . Lisinopril Cough    ROS: Pertinent positive items in HPI. Also positive for nausea and vomiting one week ago. None since. Negative for fever, chills, diarrhea, constipation, dysuria, hematuria, urgency, frequency, flank pain, vaginal discharge, vaginal bleeding.  OBJECTIVE Blood pressure 112/82, pulse 67, temperature 97.6 F (36.4 C), temperature source Oral, resp. rate 22, height 5' 3.5" (1.613 m), weight 130.817 kg (288 lb 6.4 oz), SpO2 96.00%. GENERAL: Well-developed, well-nourished, obese female in severe distress. Lying on her side clutching her low abdomen. HEENT: Normocephalic HEART: normal rate RESP: normal effort ABDOMEN: Soft, moderate suprapubic tenderness. Positive bowel sounds x4. Negative CVA tenderness. EXTREMITIES: Nontender, no edema  NEURO: Alert and oriented SPECULUM EXAM: Deferred. Started having small amount of dark red bleeding during MAU visit.   LAB RESULTS Results for orders placed during the hospital encounter of 11/01/13 (from the past 24 hour(s))  URINALYSIS, ROUTINE W REFLEX MICROSCOPIC     Status: None   Collection Time    11/01/13  3:52 AM      Result Value Ref Range   Color, Urine YELLOW  YELLOW   APPearance CLEAR  CLEAR   Specific Gravity, Urine 1.015  1.005 - 1.030   pH 6.5  5.0 - 8.0   Glucose, UA NEGATIVE  NEGATIVE mg/dL   Hgb urine dipstick NEGATIVE  NEGATIVE   Bilirubin Urine NEGATIVE  NEGATIVE   Ketones, ur NEGATIVE  NEGATIVE mg/dL   Protein, ur NEGATIVE  NEGATIVE  mg/dL   Urobilinogen, UA 0.2  0.0 - 1.0 mg/dL   Nitrite NEGATIVE  NEGATIVE   Leukocytes, UA NEGATIVE  NEGATIVE  POCT PREGNANCY, URINE     Status: None   Collection Time    11/01/13  4:03 AM      Result Value Ref Range   Preg Test, Ur NEGATIVE  NEGATIVE  CBC     Status: Abnormal   Collection Time    11/01/13  4:20 AM      Result Value Ref Range   WBC 10.3  4.0 - 10.5 K/uL   RBC 5.11  3.87 - 5.11 MIL/uL   Hemoglobin 12.7  12.0 - 15.0 g/dL   HCT 38.8  36.0 - 46.0 %   MCV 75.9 (*) 78.0 - 100.0 fL   MCH 24.9 (*) 26.0 - 34.0 pg   MCHC 32.7  30.0 - 36.0 g/dL   RDW 15.2  11.5 - 15.5 %   Platelets 208  150 - 400 K/uL    IMAGING US Transvaginal Non-ob  11/01/2013   CLINICAL DATA:  Right lower quadrant pain. History of ablation 02/2013  EXAM: TRANSVAGINAL ULTRASOUND OF PELVIS  TECHNIQUE: Transvaginal ultrasound examination of the pelvis was performed including evaluation of the uterus, ovaries, adnexal regions, and pelvic cul-de-sac.  COMPARISON:  US TRANSVAGINAL NON-OB dated 03/06/2013  FINDINGS: Uterus  Measurements: 9.8 x 5.7 x 5.6 cm, anteverted. No fibroids or other mass visualized.  Endometrium  Thickness: 4.8 mm. Complex fluid collection demonstrated in the endometrium with ovoid solid component suggesting endometrial mass or polyp. Appearances are similar to prior study.  Right ovary  Measurements: 2.5 x 1.4 x 1.9 cm. Normal appearance/no adnexal mass.  Left ovary  Measurements: 1.9 x 1.2 x 1.4 cm. Normal appearance/no adnexal mass.  Other findings:  No free fluid  IMPRESSION: Persistent finding of complex fluid collection in the endometrium with a solid polypoid component suggesting endometrial mass or polyp. Ovaries are unremarkable.   Electronically Signed   By: Lucienne Capers M.D.   On: 11/01/2013 05:52   US Pelvis Complete  11/01/2013   CLINICAL DATA:  Right lower quadrant pain. History of ablation 02/2013  EXAM: TRANSVAGINAL ULTRASOUND OF PELVIS  TECHNIQUE: Transvaginal ultrasound  examination of the pelvis was performed including evaluation of the uterus, ovaries, adnexal regions, and pelvic cul-de-sac.  COMPARISON:  US TRANSVAGINAL NON-OB dated 03/06/2013  FINDINGS: Uterus  Measurements: 9.8 x 5.7 x 5.6 cm, anteverted. No fibroids or other mass visualized.  Endometrium  Thickness: 4.8 mm. Complex fluid collection demonstrated in the endometrium with ovoid solid component suggesting endometrial mass or polyp. Appearances are similar to prior study.  Right ovary  Measurements: 2.5 x 1.4  x 1.9 cm. Normal appearance/no adnexal mass.  Left ovary  Measurements: 1.9 x 1.2 x 1.4 cm. Normal appearance/no adnexal mass.  Other findings:  No free fluid  IMPRESSION: Persistent finding of complex fluid collection in the endometrium with a solid polypoid component suggesting endometrial mass or polyp. Ovaries are unremarkable.   Electronically Signed   By: Lucienne Capers M.D.   On: 11/01/2013 05:52    MAU COURSE CBC, UA, UPT, pelvic ultrasound and Dilaudid ordered.  Significant improvement in pain with Dilaudid, but then patient vomited. Pain increased to 5-6 on pain scale. Requesting pain medication, but reluctant to go out again. Percocet ordered.   ASSESSMENT 1. Hematometra   2. Uterine polyp   3. S/P endometrial ablation    PLAN Review of results of ultrasound with the patient and plan for followup this afternoon with Dr. Ihor Dow in clinic to discuss possible repeat hysteroscopy D&C.  Patient in moderate distress with pain, tearful, Patricia pressure and pain related as soon as possible. Asking if she can be admitted and have procedure performed as soon as possible. Per consult with Dr.Eure will see if patient can be worked into the [place] schedule and Dr. Elly Modena schedule today. Will notify patient of decision after rounds.  [place], [place] 11/01/2013  8:07 AM

## 2013-11-01 NOTE — MAU Note (Signed)
OR called for pt, IV hanging about piggyback- piggyback and chamber filled, IV not running.  Flushed and began to run. Proceeded to OR

## 2013-11-01 NOTE — MAU Note (Signed)
Woke up at 1am with constant RLQ pain; states has had episode of this pain in October 2014 before having ablation done & told had endometrial polyps. Denies vaginal bleeding. Denies n/v/d. Denies dysuria. No periods since surgery in October.

## 2013-11-01 NOTE — Assessment & Plan Note (Signed)
Per chart review, A1C 2013 was 5.9

## 2013-11-01 NOTE — Assessment & Plan Note (Signed)
H/o cough w/ benicar and ACEi

## 2013-11-01 NOTE — Op Note (Addendum)
PROCEDURE DATE: 11/01/2013  PREOPERATIVE DIAGNOSIS: Hematometra POSTOPERATIVE DIAGNOSIS: The same. PROCEDURE:     Dilation and Curettage. SURGEON:  Dr. Elly Modena  INDICATIONS: 48 y.o. yo G2P2002 s/p endometrial ablation in 03/2013 with abdominal pain and ultrasound findings consistent with hematometra, needing evacuation.  Risks of surgery were discussed with the patient including but not limited to: bleeding which may require transfusion; infection which may require antibiotics; injury to uterus or surrounding organs;need for additional procedures including laparotomy or laparoscopy; possibility of intrauterine scarring which may impair future fertility; and other postoperative/anesthesia complications. Written informed consent was obtained.    FINDINGS:  A 8-week size anteverted uterus, moderate amounts of blood evacuated from uterus  ANESTHESIA:    Monitored intravenous sedation, paracervical block. INTRAVENOUS FLUIDS:  1400 ml of LR ESTIMATED BLOOD LOSS:  Less than 20 ml. SPECIMENS:  Endometrial curettings COMPLICATIONS:  None immediate.  PROCEDURE DETAILS:  The patient was taken to the operating room where general anesthesia was administered and was found to be adequate.  After an adequate timeout was performed, she was placed in the dorsal lithotomy position and examined; then prepped and draped in the sterile manner.   Her bladder was catheterized for an unmeasured amount of clear, yellow urine. A vaginal speculum was then placed in the patient's vagina and a single tooth tenaculum was applied to the anterior lip of the cervix.  A paracervical block using 1% Marcaine was administered. The uterus was sounded to 10 cm. The cervix was gently dilated through serial insertion of Hagar dilators to accommodate a size 1 Sims curette that was gently advanced to the uterine fundus.  A sharp curettage was then performed until a gritty texture was noted. The cervix was further dilated using a size 14 mm  Hagar dilator. There was minimal bleeding noted and the tenaculum removed with good hemostasis noted.  The patient tolerated the procedure well.  The patient was taken to the recovery area in stable condition.

## 2013-11-01 NOTE — Discharge Instructions (Signed)
Dilation and Curettage or Vacuum Curettage, Care After  Refer to this sheet in the next few weeks. These instructions provide you with information on caring for yourself after your procedure. Your health care provider may also give you more specific instructions. Your treatment has been planned according to current medical practices, but problems sometimes occur. Call your health care provider if you have any problems or questions after your procedure.  WHAT TO EXPECT AFTER THE PROCEDURE  After your procedure, it is typical to have light cramping and bleeding. This may last for 2 days to 2 weeks after the procedure.  HOME CARE INSTRUCTIONS   · Do not drive for 24 hours.  · Wait 1 week before returning to strenuous activities.  · Take your temperature 2 times a day for 4 days and write it down. Provide these temperatures to your health care provider if you develop a fever.  · Avoid long periods of standing.  · Avoid heavy lifting, pushing, or pulling. Do not lift anything heavier than 10 pounds (4.5 kg).  · Limit stair climbing to once or twice a day.  · Take rest periods often.  · You may resume your usual diet.  · Drink enough fluids to keep your urine clear or pale yellow.  · Your usual bowel function should return. If you have constipation, you may:  · Take a mild laxative with permission from your health care provider.  · Add fruit and bran to your diet.  · Drink more fluids.  · Take showers instead of baths until your health care provider gives you permission to take baths.  · Do not go swimming or use a hot tub until your health care provider approves.  · Try to have someone with you or available to you the first 24 48 hours, especially if you were given a general anesthetic.  · Do not douche, use tampons, or have intercourse for 2 weeks after the procedure.  · Only take over-the-counter or prescription medicines as directed by your health care provider. Do not take aspirin. It can cause bleeding.  · Follow up  with your health care provider as directed.  SEEK MEDICAL CARE IF:   · You have increasing cramps or pain that is not relieved with medicine.  · You have abdominal pain that does not seem to be related to the same area of earlier cramping and pain.  · You have bad smelling vaginal discharge.  · You have a rash.  · You are having problems with any medicine.  SEEK IMMEDIATE MEDICAL CARE IF:   · You have bleeding that is heavier than a normal menstrual period.  · You have a fever.  · You have chest pain.  · You have shortness of breath.  · You feel dizzy or feel like fainting.  · You pass out.  · You have pain in your shoulder strap area.  · You have heavy vaginal bleeding with or without blood clots.  Document Released: 06/04/2000 Document Revised: 03/28/2013 Document Reviewed: 01/04/2013  [employer]® Patient Information ©2014 [employer].

## 2013-11-01 NOTE — MAU Note (Signed)
Assisted up to bathroom to void.  Some nausea when returned to rm.

## 2013-11-01 NOTE — Anesthesia Postprocedure Evaluation (Signed)
  Anesthesia Post Note  Patient: Diana Schultz  Procedure(s) Performed: Procedure(s) (LRB): DILATATION AND CURETTAGE (N/A)  Anesthesia type: GA  Patient location: PACU  Post pain: Pain level controlled  Post assessment: Post-op Vital signs reviewed  Last Vitals:  Filed Vitals:   11/01/13 1530  BP: 137/85  Pulse: 68  Temp:   Resp:     Post vital signs: Reviewed  Level of consciousness: sedated  Complications: No apparent anesthesia complications

## 2013-11-01 NOTE — Anesthesia Preprocedure Evaluation (Addendum)
Anesthesia Evaluation  Patient identified by MRN, date of birth, ID band Patient awake    Reviewed: Allergy & Precautions, H&P , Patient's Chart, lab work & pertinent test results, reviewed documented beta blocker date and time   History of Anesthesia Complications Negative for: history of anesthetic complications  Airway Mallampati: III TM Distance: >3 FB Neck ROM: full    Dental   Pulmonary  breath sounds clear to auscultation        Cardiovascular Exercise Tolerance: Good hypertension, Rhythm:regular Rate:Normal     Neuro/Psych PSYCHIATRIC DISORDERS Depression negative psych ROS   GI/Hepatic GERD-  ,  Endo/Other  Morbid obesity  Renal/GU      Musculoskeletal   Abdominal   Peds  Hematology   Anesthesia Other Findings   Reproductive/Obstetrics                         Anesthesia Physical Anesthesia Plan  ASA: III  Anesthesia Plan: General ETT   Post-op Pain Management:    Induction: Rapid sequence, Cricoid pressure planned and Intravenous  Airway Management Planned: Oral ETT  Additional Equipment:   Intra-op Plan:   Post-operative Plan:   Informed Consent: I have reviewed the patients History and Physical, chart, labs and discussed the procedure including the risks, benefits and alternatives for the proposed anesthesia with the patient or authorized representative who has indicated his/her understanding and acceptance.   Dental Advisory Given  Plan Discussed with: CRNA, Surgeon and Anesthesiologist  Anesthesia Plan Comments:       Anesthesia Quick Evaluation

## 2013-11-02 ENCOUNTER — Encounter (HOSPITAL_COMMUNITY): Payer: Self-pay | Admitting: Obstetrics and Gynecology

## 2013-11-05 ENCOUNTER — Encounter: Payer: Self-pay | Admitting: Gastroenterology

## 2013-11-06 ENCOUNTER — Other Ambulatory Visit (INDEPENDENT_AMBULATORY_CARE_PROVIDER_SITE_OTHER): Payer: 59

## 2013-11-06 DIAGNOSIS — Z0000 Encounter for general adult medical examination without abnormal findings: Secondary | ICD-10-CM

## 2013-11-06 LAB — LIPID PANEL
CHOL/HDL RATIO: 3
Cholesterol: 120 mg/dL (ref 0–200)
HDL: 46.5 mg/dL (ref >39.00)
LDL CALC: 54 mg/dL (ref 0–99)
Triglycerides: 98 mg/dL (ref 0.0–149.0)
VLDL: 19.6 mg/dL (ref 0.0–40.0)

## 2013-11-06 LAB — TSH: TSH: 1.44 u[IU]/mL (ref 0.35–4.50)

## 2013-11-14 ENCOUNTER — Ambulatory Visit (INDEPENDENT_AMBULATORY_CARE_PROVIDER_SITE_OTHER): Payer: 59 | Admitting: Obstetrics & Gynecology

## 2013-11-14 ENCOUNTER — Encounter: Payer: Self-pay | Admitting: Obstetrics & Gynecology

## 2013-11-14 VITALS — BP 140/83 | HR 58 | Temp 97.1°F | Ht 63.5 in | Wt 287.3 lb

## 2013-11-14 DIAGNOSIS — Z09 Encounter for follow-up examination after completed treatment for conditions other than malignant neoplasm: Secondary | ICD-10-CM

## 2013-11-14 DIAGNOSIS — IMO0001 Reserved for inherently not codable concepts without codable children: Secondary | ICD-10-CM

## 2013-11-14 DIAGNOSIS — N857 Hematometra: Secondary | ICD-10-CM

## 2013-11-14 DIAGNOSIS — R030 Elevated blood-pressure reading, without diagnosis of hypertension: Secondary | ICD-10-CM

## 2013-11-14 NOTE — Patient Instructions (Addendum)
Perimenopause Perimenopause is the time when your body begins to move into the menopause (no menstrual period for 12 straight months). It is a natural process. Perimenopause can begin 2 8 years before the menopause and usually lasts for 1 year after the menopause. During this time, your ovaries may or may not produce an egg. The ovaries vary in their production of estrogen and progesterone hormones each month. This can cause irregular menstrual periods, difficulty getting pregnant, vaginal bleeding between periods, and uncomfortable symptoms. CAUSES  Irregular production of the ovarian hormones, estrogen and progesterone, and not ovulating every month.  Other causes include:  Tumor of the pituitary gland in the brain.  Medical disease that affects the ovaries.  Radiation treatment.  Chemotherapy.  Unknown causes.  Heavy smoking and excessive alcohol intake can bring on perimenopause sooner. SIGNS AND SYMPTOMS   Hot flashes.  Night sweats.  Irregular menstrual periods.  Decreased sex drive.  Vaginal dryness.  Headaches.  Mood swings.  Depression.  Memory problems.  Irritability.  Tiredness.  Weight gain.  Trouble getting pregnant.  The beginning of losing bone cells (osteoporosis).  The beginning of hardening of the arteries (atherosclerosis). DIAGNOSIS  Your health care provider will make a diagnosis by analyzing your age, menstrual history, and symptoms. He or she will do a physical exam and note any changes in your body, especially your female organs. Female hormone tests may or may not be helpful depending on the amount of female hormones you produce and when you produce them. However, other hormone tests may be helpful to rule out other problems. TREATMENT  In some cases, no treatment is needed. The decision on whether treatment is necessary during the perimenopause should be made by you and your health care provider based on how the symptoms are affecting you  and your lifestyle. Various treatments are available, such as:  Treating individual symptoms with a specific medicine for that symptom.  Herbal medicines that can help specific symptoms.  Counseling.  Group therapy. HOME CARE INSTRUCTIONS   Keep track of your menstrual periods (when they occur, how heavy they are, how long between periods, and how long they last) as well as your symptoms and when they started.  Only take over-the-counter or prescription medicines as directed by your health care provider.  Sleep and rest.  Exercise.  Eat a diet that contains calcium (good for your bones) and soy (acts like the estrogen hormone).  Do not smoke.  Avoid alcoholic beverages.  Take vitamin supplements as recommended by your health care provider. Taking vitamin E may help in certain cases.  Take calcium and vitamin D supplements to help prevent bone loss.  Group therapy is sometimes helpful.  Acupuncture may help in some cases. SEEK MEDICAL CARE IF:   You have questions about any symptoms you are having.  You need a referral to a specialist (gynecologist, psychiatrist, or psychologist). SEEK IMMEDIATE MEDICAL CARE IF:   You have vaginal bleeding.  Your period lasts longer than 8 days.  Your periods are recurring sooner than 21 days.  You have bleeding after intercourse.  You have severe depression.  You have pain when you urinate.  You have severe headaches.  You have vision problems. Document Released: 07/15/2004 Document Revised: 03/28/2013 Document Reviewed: 01/04/2013 [facility] Patient Information 2014 [place], [place]. Hysterectomy Information  A hysterectomy is a surgery in which your uterus is removed. This surgery may be done to treat various medical problems. After the surgery, you will no longer have menstrual  periods. The surgery will also make you unable to become pregnant (sterile). The fallopian tubes and ovaries can be removed (bilateral  salpingo-oophorectomy) during this surgery as well.  REASONS FOR A HYSTERECTOMY  Persistent, abnormal bleeding.  Lasting (chronic) pelvic pain or infection.  The lining of the uterus (endometrium) starts growing outside the uterus (endometriosis).  The endometrium starts growing in the muscle of the uterus (adenomyosis).  The uterus falls down into the vagina (pelvic organ prolapse).  Noncancerous growths in the uterus (uterine fibroids) that cause symptoms.  Precancerous cells.  Cervical cancer or uterine cancer. TYPES OF HYSTERECTOMIES  Supracervical hysterectomy In this type, the top part of the uterus is removed, but not the cervix.  Total hysterectomy The uterus and cervix are removed.  Radical hysterectomy The uterus, the cervix, and the fibrous tissue that holds the uterus in place in the pelvis (parametrium) are removed. WAYS A HYSTERECTOMY CAN BE PERFORMED  Abdominal hysterectomy A large surgical cut (incision) is made in the abdomen. The uterus is removed through this incision.  Vaginal hysterectomy An incision is made in the vagina. The uterus is removed through this incision. There are no abdominal incisions.  Conventional laparoscopic hysterectomy Three or four small incisions are made in the abdomen. A thin, lighted tube with a camera (laparoscope) is inserted into one of the incisions. Other tools are put through the other incisions. The uterus is cut into small pieces. The small pieces are removed through the incisions, or they are removed through the vagina.  Laparoscopically assisted vaginal hysterectomy (LAVH) Three or four small incisions are made in the abdomen. Part of the surgery is performed laparoscopically and part vaginally. The uterus is removed through the vagina.  Robot-assisted laparoscopic hysterectomy A laparoscope and other tools are inserted into 3 or 4 small incisions in the abdomen. A computer-controlled device is used to give the surgeon a 3D  image and to help control the surgical instruments. This allows for more precise movements of surgical instruments. The uterus is cut into small pieces and removed through the incisions or removed through the vagina. RISKS AND COMPLICATIONS  Possible complications associated with this procedure include:  Bleeding and risk of blood transfusion. Tell your health care provider if you do not want to receive any blood products.  Blood clots in the legs or lung.  Infection.  Injury to surrounding organs.  Problems or side effects related to anesthesia.  Conversion to an abdominal hysterectomy from one of the other techniques. WHAT TO EXPECT AFTER A HYSTERECTOMY  You will be given pain medicine.  You will need to have someone with you for the first 3 5 days after you go home.  You will need to follow up with your surgeon in 2 4 weeks after surgery to evaluate your progress.  You may have early menopause symptoms such as hot flashes, night sweats, and insomnia.  If you had a hysterectomy for a problem that was not cancer or not a condition that could lead to cancer, then you no longer need Pap tests. However, even if you no longer need a Pap test, a regular exam is a good idea to make sure no other problems are starting. Document Released: 12/01/2000 Document Revised: 03/28/2013 Document Reviewed: 02/12/2013 [facility] Patient Information 2014 [place].

## 2013-11-14 NOTE — Progress Notes (Signed)
Subjective:     Patient ID: Diana Schultz, female   DOB: [birth year], 48 y.o.   MRN: [id number]  HPI Pt presents for a 1 1/2 week post op check.  Pt with h/o ablation presented almost 8 months later with severe pain thought to be caused by a hematometra.  She denies pain since her D&C.    Elevated BP was diet controlled.  Pt has not exercised recently.   Review of Systems     Objective:   Physical Exam Temp(Src) 97.1 F (36.2 C) (Oral)  Ht 5' 3.5" (1.613 m)  Wt 287 lb 4.8 oz (130.318 kg)  BMI 50.09 kg/m2 Abd: morbidly obese,  NT, ND   11/01/2013 Diagnosis Endometrium, curettage - TRANSFORMATION ZONE MUCOSA WITH ASSOCIATED CHRONIC INFLAMMATION. - FRAGMENTS SMOOTH MUSCLE BUNDLES. - SCANTY SUPERFICIAL FRAGMENTS OF ATROPHIC ENDOMETRIAL GLANDS. PLEASE SEE COMMENT FOR DETAIL.     Assessment:     2 week post check after D&C     Elevated BP.  Will recheck at next visit.  Pt aware that if BP remains elevated she will need to be started on meds Plan:     F/u in 3 months or sooner prn If sx return or persist, pt may need a hyst.  Is most likely a candidate for a TVH.

## 2013-11-25 ENCOUNTER — Encounter (HOSPITAL_COMMUNITY): Payer: Self-pay | Admitting: Family

## 2013-11-25 ENCOUNTER — Inpatient Hospital Stay (HOSPITAL_COMMUNITY)
Admission: AD | Admit: 2013-11-25 | Discharge: 2013-11-25 | Disposition: A | Discharge status: Home / Self Care | Payer: 59 | Source: Ambulatory Visit | Attending: Obstetrics & Gynecology | Admitting: Obstetrics & Gynecology

## 2013-11-25 ENCOUNTER — Inpatient Hospital Stay (HOSPITAL_COMMUNITY): Payer: 59

## 2013-11-25 DIAGNOSIS — I10 Essential (primary) hypertension: Secondary | ICD-10-CM | POA: Insufficient documentation

## 2013-11-25 DIAGNOSIS — N949 Unspecified condition associated with female genital organs and menstrual cycle: Secondary | ICD-10-CM

## 2013-11-25 DIAGNOSIS — R102 Pelvic and perineal pain: Secondary | ICD-10-CM

## 2013-11-25 DIAGNOSIS — K219 Gastro-esophageal reflux disease without esophagitis: Secondary | ICD-10-CM | POA: Insufficient documentation

## 2013-11-25 LAB — URINALYSIS, ROUTINE W REFLEX MICROSCOPIC
Bilirubin Urine: NEGATIVE
Glucose, UA: NEGATIVE mg/dL
Hgb urine dipstick: NEGATIVE
Ketones, ur: NEGATIVE mg/dL
LEUKOCYTES UA: NEGATIVE
NITRITE: NEGATIVE
PH: 6.5 (ref 5.0–8.0)
Protein, ur: NEGATIVE mg/dL
SPECIFIC GRAVITY, URINE: 1.02 (ref 1.005–1.030)
UROBILINOGEN UA: 0.2 mg/dL (ref 0.0–1.0)

## 2013-11-25 MED ORDER — PROMETHAZINE HCL 25 MG PO TABS: 25.0000 mg | ORAL_TABLET | Freq: Four times a day (QID) | ORAL | Status: DC | PRN

## 2013-11-25 MED ORDER — KETOROLAC TROMETHAMINE 60 MG/2ML IM SOLN
60.0000 mg | Freq: Once | INTRAMUSCULAR | Status: AC
  Administered 2013-11-25: 60 mg via INTRAMUSCULAR
  Filled 2013-11-25: qty 2

## 2013-11-25 MED ORDER — HYDROMORPHONE HCL 2 MG PO TABS: 2.0000 mg | ORAL_TABLET | ORAL | Status: DC | PRN

## 2013-11-25 MED ORDER — HYDROMORPHONE HCL PF 1 MG/ML IJ SOLN
1.0000 mg | Freq: Once | INTRAMUSCULAR | Status: AC
  Administered 2013-11-25: 1 mg via INTRAMUSCULAR
  Filled 2013-11-25: qty 1

## 2013-11-25 MED ORDER — PROMETHAZINE HCL 25 MG/ML IJ SOLN
25.0000 mg | Freq: Once | INTRAMUSCULAR | Status: AC
  Administered 2013-11-25: 25 mg via INTRAMUSCULAR
  Filled 2013-11-25: qty 1

## 2013-11-25 MED ORDER — KETOROLAC TROMETHAMINE 10 MG PO TABS: 10.0000 mg | ORAL_TABLET | Freq: Four times a day (QID) | ORAL | Status: DC | PRN

## 2013-11-25 NOTE — MAU Provider Note (Signed)
History     CSN: [id number]  Arrival date and time: 11/25/13 1327   First Provider Initiated Contact with Patient 11/25/13 1400      No chief complaint on file.  HPI Comments: Diana Schultz 48 y.o. [id number] presents to MAU with pelvic pain ongoing since Friday. She has taken Motrin and Percecet with no relief and has described pain as cramping and 10/10. She had an Ablation Feb 2014 and developed a hematometra and was sent for [facility]. She feels the pain has returned.      Past Medical History  Diagnosis Date  . Ovarian cyst   . Umbilical hernia   . Hypertension     history,lost weight, controlled with diet and exercise, no current med  . DVT (deep venous thrombosis)     Hx at age 70 blood clot in right leg, d/c birth control pills and no other problems  . Depression   . Diverticulitis   . GERD (gastroesophageal reflux disease)   . Insulin resistance     Past Surgical History  Procedure Laterality Date  . Tubal ligation    . Cholecystectomy    . Appendectomy    . Leep    . Dilation and curettage of uterus    . Dilitation & currettage/hystroscopy with novasure ablation N/A 03/23/2013    Procedure: DILATATION & CURETTAGE/HYSTEROSCOPY WITH NOVASURE ABLATION;  Surgeon: Lavonia Drafts, MD;  Location: [facility];  Service: Gynecology;  Laterality: N/A;  . Dilation and curettage of uterus N/A 11/01/2013    Procedure: DILATATION AND CURETTAGE;  Surgeon: Mora Bellman, MD;  Location: [facility];  Service: Gynecology;  Laterality: N/A;    Family History  Problem Relation Age of Onset  . Cancer Mother     type? ovarian?  Marland Kitchen Hypertension Mother   . Heart failure Paternal Grandfather   . Diabetes Mother   . CAD Other     GP, late in like  . Stroke Mother     early in life  . Colon cancer Neg Hx   . Breast cancer Neg Hx     History  Substance Use Topics  . Smoking status: Never Smoker   . Smokeless tobacco: Never Used  . Alcohol Use: No     Comment: rare     Allergies:   Allergies  Allergen Reactions  . Benicar [Olmesartan] Cough  . Lisinopril Cough    Prescriptions prior to admission  Medication Sig Dispense Refill  . ibuprofen (ADVIL,MOTRIN) 200 MG tablet Take 400 mg by mouth every 6 (six) hours as needed for mild pain or moderate pain.      Marland Kitchen ibuprofen (ADVIL,MOTRIN) 600 MG tablet Take 1 tablet (600 mg total) by mouth every 6 (six) hours as needed.  30 tablet  1  . oxyCODONE-acetaminophen (PERCOCET/ROXICET) 5-325 MG per tablet Take 1-2 tablets by mouth every 6 (six) hours as needed for severe pain.  30 tablet  0    Review of Systems  Constitutional: Negative.   HENT: Negative.   Eyes: Negative.   Respiratory: Negative.   Cardiovascular: Negative.   Gastrointestinal: Positive for abdominal pain.  Genitourinary: Negative.   Musculoskeletal: Negative.   Skin: Negative.   Neurological: Negative.   Psychiatric/Behavioral: Negative.    Physical Exam   Blood pressure 140/60, pulse 65, temperature 98.1 F (36.7 C), temperature source Oral, resp. rate 20.  Physical Exam  Constitutional: She is oriented to person, place, and time. She appears well-developed and well-nourished. She appears distressed.  Tearful with pain  HENT:  Head: Normocephalic and atraumatic.  Eyes: Pupils are equal, round, and reactive to light.  Cardiovascular: Normal rate, regular rhythm and normal heart sounds.   Respiratory: Effort normal and breath sounds normal. No respiratory distress. She has no wheezes.  Genitourinary:  Not examined  Neurological: She is alert and oriented to person, place, and time.  Skin: Skin is warm and dry.  Psychiatric: She has a normal mood and affect. Her behavior is normal. Thought content normal.   US Transvaginal Non-ob  11/25/2013   CLINICAL DATA:  Chronic pelvic pain worse today. D and C 11/01/2013 and 03/23/2013. Bilateral tubal ligation. Previous [facility] ablation.  EXAM: TRANSVAGINAL ULTRASOUND OF PELVIS  TECHNIQUE: Transvaginal  ultrasound examination of the pelvis was performed including evaluation of the uterus, ovaries, adnexal regions, and pelvic cul-de-sac.  COMPARISON:  11/01/2013  FINDINGS: Exam is somewhat difficult due to obesity and pain.  Uterus  Measurements: 5.4 x 5.8 x 10.8 cm. Two small adjacent ovoid hypoechoic masses over the periphery of the anterior uterine body measuring 5 x 7 x 7 mm and 7 x 8 x 8 mm respectively likely small fibroids.  Endometrium  Thickness: 19 mm. Focal ovoid mildly complex fluid collection over the lower uterine endometrial canal as these findings are likely related to patient's recent dilatation and curetteage. The endometrium was better demonstrated on the prior exam.  Right ovary  Measurements: 1.7 x 1.4 x 2.3 cm. Normal appearance/no adnexal mass.  Left ovary  Not visualized.  Other findings:  No free fluid  IMPRESSION: Nonspecific endometrium thickening with focal fluid collection over the lower uterine endometrial canal. Findings likely related to patient's recent dilatation and curettage with hemorrhagic debris. Cannot exclude endometritis. Recommend clinical correlation and follow-up ultrasound in 4 weeks.  Two small uterine fibroids.   Electronically Signed   By: Marin Olp M.D.   On: 11/25/2013 15:37     MAU Course  Procedures  MDM  Dilaudid 1mg  and phenergan 25 mg IM/ pain decreased to 5/10 Spoke with Dr Elonda Husky who advised pelvic ultrasound, home with Dilaudid, toradol and follow in GYN Clinic asap Dr Elonda Husky reviewed Ultrasound Toradol 60 mg / pain down to 4/10 Repeat Dilaudid 1 mg prior to discharge as she thinks her pharmacy will be closed by her arrival time Assessment and Plan   A: Pelvic Pain  P: Dilaudid 2mg  po tabs Phenergan 25 mg po q6 hrs Toradol 10 mg po q8 hrs Appointment with [facility] asap for TAH evaluation Return to MAU as needed  [name] 11/25/2013, 2:16 PM

## 2013-11-25 NOTE — Discharge Instructions (Signed)
Hysterectomy Information  A hysterectomy is a surgery in which your uterus is removed. This surgery may be done to treat various medical problems. After the surgery, you will no longer have menstrual periods. The surgery will also make you unable to become pregnant (sterile). The fallopian tubes and ovaries can be removed (bilateral salpingo-oophorectomy) during this surgery as well.  REASONS FOR A HYSTERECTOMY  Persistent, abnormal bleeding.  Lasting (chronic) pelvic pain or infection.  The lining of the uterus (endometrium) starts growing outside the uterus (endometriosis).  The endometrium starts growing in the muscle of the uterus (adenomyosis).  The uterus falls down into the vagina (pelvic organ prolapse).  Noncancerous growths in the uterus (uterine fibroids) that cause symptoms.  Precancerous cells.  Cervical cancer or uterine cancer. TYPES OF HYSTERECTOMIES  Supracervical hysterectomy In this type, the top part of the uterus is removed, but not the cervix.  Total hysterectomy The uterus and cervix are removed.  Radical hysterectomy The uterus, the cervix, and the fibrous tissue that holds the uterus in place in the pelvis (parametrium) are removed. WAYS A HYSTERECTOMY CAN BE PERFORMED  Abdominal hysterectomy A large surgical cut (incision) is made in the abdomen. The uterus is removed through this incision.  Vaginal hysterectomy An incision is made in the vagina. The uterus is removed through this incision. There are no abdominal incisions.  Conventional laparoscopic hysterectomy Three or four small incisions are made in the abdomen. A thin, lighted tube with a camera (laparoscope) is inserted into one of the incisions. Other tools are put through the other incisions. The uterus is cut into small pieces. The small pieces are removed through the incisions, or they are removed through the vagina.  Laparoscopically assisted vaginal hysterectomy (LAVH) Three or four small  incisions are made in the abdomen. Part of the surgery is performed laparoscopically and part vaginally. The uterus is removed through the vagina.  Robot-assisted laparoscopic hysterectomy A laparoscope and other tools are inserted into 3 or 4 small incisions in the abdomen. A computer-controlled device is used to give the surgeon a 3D image and to help control the surgical instruments. This allows for more precise movements of surgical instruments. The uterus is cut into small pieces and removed through the incisions or removed through the vagina. RISKS AND COMPLICATIONS  Possible complications associated with this procedure include:  Bleeding and risk of blood transfusion. Tell your health care provider if you do not want to receive any blood products.  Blood clots in the legs or lung.  Infection.  Injury to surrounding organs.  Problems or side effects related to anesthesia.  Conversion to an abdominal hysterectomy from one of the other techniques. WHAT TO EXPECT AFTER A HYSTERECTOMY  You will be given pain medicine.  You will need to have someone with you for the first 3 5 days after you go home.  You will need to follow up with your surgeon in 2 4 weeks after surgery to evaluate your progress.  You may have early menopause symptoms such as hot flashes, night sweats, and insomnia.  If you had a hysterectomy for a problem that was not cancer or not a condition that could lead to cancer, then you no longer need Pap tests. However, even if you no longer need a Pap test, a regular exam is a good idea to make sure no other problems are starting. Document Released: 12/01/2000 Document Revised: 03/28/2013 Document Reviewed: 02/12/2013 [employer] Patient Information 2014 [employer].  

## 2013-11-25 NOTE — MAU Note (Signed)
48 yo, s/p 10/31/13 D&C, with c/o pelvic pain progressively worsening today. She has been throwing up this morning from pain. Oxycodone not relieving pain. She denies intercourse or tub baths since procedure.  Patient tearful at bedside.

## 2013-11-26 ENCOUNTER — Encounter: Payer: Self-pay | Admitting: Obstetrics & Gynecology

## 2013-11-26 ENCOUNTER — Ambulatory Visit (INDEPENDENT_AMBULATORY_CARE_PROVIDER_SITE_OTHER): Payer: 59 | Admitting: Obstetrics & Gynecology

## 2013-11-26 VITALS — BP 153/94 | HR 79 | Temp 97.0°F | Wt 284.1 lb

## 2013-11-26 DIAGNOSIS — N949 Unspecified condition associated with female genital organs and menstrual cycle: Secondary | ICD-10-CM

## 2013-11-26 DIAGNOSIS — R102 Pelvic and perineal pain: Secondary | ICD-10-CM

## 2013-11-26 MED ORDER — KETOROLAC TROMETHAMINE 10 MG PO TABS: 10.0000 mg | ORAL_TABLET | Freq: Four times a day (QID) | ORAL | Status: DC | PRN

## 2013-11-26 NOTE — Progress Notes (Signed)
CLINIC ENCOUNTER NOTE  History:  48 y.o. [id number] here today for discussion about hysterectomy. Had Novasure in 03/23/2013 by Dr. Ihor Dow, complicated by cervical stenosis and hematometra requiring D&C 11/01/2013.  Patient reports worsening pain, no bleeding. Was seen in the MAU on 11/25/13, which showed probable small hematometra. She does not want another D&C, wants a hysterectomy ASAP. She is crying, saying she is in significant pain and this is interfering with her life. Was given Dilaudid and Toradol, wants refill of Toradol.   The following portions of the patient's history were reviewed and updated as appropriate: allergies, current medications, past family history, past medical history, past social history, past surgical history and problem list.  Review of Systems:  Pertinent items are noted in HPI.  Objective:  Physical Exam BP 153/94  Pulse 79  Temp(Src) 97 F (36.1 C) (Oral)  Wt 284 lb 1.6 oz (128.867 kg) Gen: NAD Abd: Soft, diffuse moderate pelvic pain, nondistended Pelvic: Deferred  Labs and Imaging  11/25/2013   TRANSVAGINAL ULTRASOUND OF PELVIS CLINICAL DATA:  Chronic pelvic pain worse today. D and C 11/01/2013 and 03/23/2013. Bilateral tubal ligation. Previous [facility] ablation.   TECHNIQUE: Transvaginal ultrasound examination of the pelvis was performed including evaluation of the uterus, ovaries, adnexal regions, and pelvic cul-de-sac.  COMPARISON:  11/01/2013  FINDINGS: Exam is somewhat difficult due to obesity and pain.  Uterus  Measurements: 5.4 x 5.8 x 10.8 cm. Two small adjacent ovoid hypoechoic masses over the periphery of the anterior uterine body measuring 5 x 7 x 7 mm and 7 x 8 x 8 mm respectively likely small fibroids.  Endometrium  Thickness: 19 mm. Focal ovoid mildly complex fluid collection over the lower uterine endometrial canal as these findings are likely related to patient's recent dilatation and curetteage. The endometrium was better demonstrated on the  prior exam.  Right ovary  Measurements: 1.7 x 1.4 x 2.3 cm. Normal appearance/no adnexal mass.  Left ovary  Not visualized.  Other findings:  No free fluid  IMPRESSION: Nonspecific endometrium thickening with focal fluid collection over the lower uterine endometrial canal. Findings likely related to patient's recent dilatation and curettage with hemorrhagic debris. Cannot exclude endometritis. Recommend clinical correlation and follow-up ultrasound in 4 weeks.  Two small uterine fibroids.   Electronically Signed   By: Marin Olp M.D.   On: 11/25/2013 15:37   11/01/2013  TRANSVAGINAL ULTRASOUND OF PELVIS CLINICAL DATA:  Right lower quadrant pain. History of ablation 02/2013    TECHNIQUE: Transvaginal ultrasound examination of the pelvis was performed including evaluation of the uterus, ovaries, adnexal regions, and pelvic cul-de-sac.  COMPARISON:  US TRANSVAGINAL NON-OB dated 03/06/2013  FINDINGS: Uterus  Measurements: 9.8 x 5.7 x 5.6 cm, anteverted. No fibroids or other mass visualized.  Endometrium  Thickness: 4.8 mm. Complex fluid collection demonstrated in the endometrium with ovoid solid component suggesting endometrial mass or polyp. Appearances are similar to prior study.  Right ovary  Measurements: 2.5 x 1.4 x 1.9 cm. Normal appearance/no adnexal mass.  Left ovary  Measurements: 1.9 x 1.2 x 1.4 cm. Normal appearance/no adnexal mass.  Other findings:  No free fluid  IMPRESSION: Persistent finding of complex fluid collection in the endometrium with a solid polypoid component suggesting endometrial mass or polyp. Ovaries are unremarkable.   Electronically Signed   By: Lucienne Capers M.D.   On: 11/01/2013 05:52    Assessment & Plan:   Patient desires definitive management with hysterectomy.  I proposed doing a vaginal hysterectomy (VH)  and prophylactic bilateral salpingectomy.  No indication for oophorectomy. Prophylactic oophorectomy and associated surgical menopause could have major health risks such  as loss of ovarian hormones that could lead to vasomotor symptoms and osteoporosis, increased risk of cardiovascular disease, dementia, procedure-related complications and increased overall mortality. However, patient was counseled regarding a prophylactic bilateral salpingectomy given that a growing body of knowledge reveals that the majority of cases of high grade serous "ovarian" cancer actually are fallopian tube cancers.The precursor lesions are though to arise from the fimbriated end of the fallopian tube and the cancer can have metastases from this primary lesion; and removal of fallopian tubes do not result in any known hormonal imbalance.   Patient agrees with this proposed surgery.  The risks of surgery were discussed in detail with the patient including but not limited to: bleeding which may require transfusion or reoperation; infection which may require antibiotics; injury to bowel, bladder, ureters or other surrounding organs; need for additional procedures including laparotomy; thromboembolic phenomenon, incisional problems and other postoperative/anesthesia complications.  Patient was also advised that she will remain in house for 1 day; and expected recovery time after a vaginal hysterectomy is 4-6 weeks.  Likelihood of success in alleviating the patient's symptoms was discussed. Routine postoperative instructions will be reviewed with the patient and her family in detail after surgery.  She was told that she will be contacted by our surgical scheduler regarding the time and date of her surgery; routine preoperative instructions of having nothing to eat or drink after midnight on the day prior to surgery and also coming to the hospital 1 1/2 hours prior to her time of surgery were also emphasized.  She was told she may be called for a preoperative appointment about a week prior to surgery and will be given further preoperative instructions at that visit.  In the meantime, she will continue Toradol  and Dilaudid as needed, pain precautions were reviewed. Printed patient education handouts about the procedure was given to the patient to review at home.  Patient was told that every effort will be made to schedule her with myself of Dr. Ihor Dow. She says she cannot wait for long, is considering checking with other providers.    Verita Schneiders, MD, [place] Attending [place], [facility]

## 2013-11-26 NOTE — Patient Instructions (Signed)
Hysterectomy Information  A hysterectomy is a surgery in which your uterus is removed. This surgery may be done to treat various medical problems. After the surgery, you will no longer have menstrual periods. The surgery will also make you unable to become pregnant (sterile). The fallopian tubes and ovaries can be removed (bilateral salpingo-oophorectomy) during this surgery as well.  REASONS FOR A HYSTERECTOMY  Persistent, abnormal bleeding.  Lasting (chronic) pelvic pain or infection.  The lining of the uterus (endometrium) starts growing outside the uterus (endometriosis).  The endometrium starts growing in the muscle of the uterus (adenomyosis).  The uterus falls down into the vagina (pelvic organ prolapse).  Noncancerous growths in the uterus (uterine fibroids) that cause symptoms.  Precancerous cells.  Cervical cancer or uterine cancer. TYPES OF HYSTERECTOMIES  Supracervical hysterectomy In this type, the top part of the uterus is removed, but not the cervix.  Total hysterectomy The uterus and cervix are removed.  Radical hysterectomy The uterus, the cervix, and the fibrous tissue that holds the uterus in place in the pelvis (parametrium) are removed. WAYS A HYSTERECTOMY CAN BE PERFORMED  Abdominal hysterectomy A large surgical cut (incision) is made in the abdomen. The uterus is removed through this incision.  Vaginal hysterectomy An incision is made in the vagina. The uterus is removed through this incision. There are no abdominal incisions.  Conventional laparoscopic hysterectomy Three or four small incisions are made in the abdomen. A thin, lighted tube with a camera (laparoscope) is inserted into one of the incisions. Other tools are put through the other incisions. The uterus is cut into small pieces. The small pieces are removed through the incisions, or they are removed through the vagina.  Laparoscopically assisted vaginal hysterectomy (LAVH) Three or four small  incisions are made in the abdomen. Part of the surgery is performed laparoscopically and part vaginally. The uterus is removed through the vagina.  Robot-assisted laparoscopic hysterectomy A laparoscope and other tools are inserted into 3 or 4 small incisions in the abdomen. A computer-controlled device is used to give the surgeon a 3D image and to help control the surgical instruments. This allows for more precise movements of surgical instruments. The uterus is cut into small pieces and removed through the incisions or removed through the vagina. RISKS AND COMPLICATIONS  Possible complications associated with this procedure include:  Bleeding and risk of blood transfusion. Tell your health care provider if you do not want to receive any blood products.  Blood clots in the legs or lung.  Infection.  Injury to surrounding organs.  Problems or side effects related to anesthesia.  Conversion to an abdominal hysterectomy from one of the other techniques. WHAT TO EXPECT AFTER A HYSTERECTOMY  You will be given pain medicine.  You will need to have someone with you for the first 3 5 days after you go home.  You will need to follow up with your surgeon in 2 4 weeks after surgery to evaluate your progress.  You may have early menopause symptoms such as hot flashes, night sweats, and insomnia.  If you had a hysterectomy for a problem that was not cancer or not a condition that could lead to cancer, then you no longer need Pap tests. However, even if you no longer need a Pap test, a regular exam is a good idea to make sure no other problems are starting. Document Released: 12/01/2000 Document Revised: 03/28/2013 Document Reviewed: 02/12/2013 [facility] Patient Information 2014 [place] [employer].

## 2013-11-27 ENCOUNTER — Telehealth: Payer: Self-pay | Admitting: *Deleted

## 2013-11-27 NOTE — Telephone Encounter (Signed)
Patient phoned very concerned about the scheduling of her surgery.  I explained to the patient that I understand her concern.  Explained that we will do everything in our power to schedule her as quickly as possible.  Explained we will call her if we have any cancellations.  Mellody Dance and I have discussed this patient and are aware of her desire to be scheduled as quickly as possible.

## 2013-12-03 ENCOUNTER — Ambulatory Visit (AMBULATORY_SURGERY_CENTER): Payer: 59

## 2013-12-03 VITALS — Ht 63.5 in | Wt 286.0 lb

## 2013-12-03 DIAGNOSIS — Z1211 Encounter for screening for malignant neoplasm of colon: Secondary | ICD-10-CM

## 2013-12-03 MED ORDER — MOVIPREP 100 G PO SOLR: 1.0000 | Freq: Once | ORAL | Status: DC

## 2013-12-03 NOTE — Progress Notes (Signed)
No allergies to eggs or soy No home oxygen No past problems with anesthesia except post-cholecystectomy hypoxia (doesn't remember but PACU staff mentioned it) No diet/weight loss meds Has email  Emmi instructions given for colonoscopy

## 2013-12-07 ENCOUNTER — Ambulatory Visit: Payer: 59 | Admitting: Obstetrics & Gynecology

## 2013-12-17 ENCOUNTER — Ambulatory Visit (AMBULATORY_SURGERY_CENTER): Payer: 59 | Admitting: Gastroenterology

## 2013-12-17 ENCOUNTER — Encounter: Payer: Self-pay | Admitting: Gastroenterology

## 2013-12-17 VITALS — BP 126/77 | HR 59 | Temp 97.2°F | Resp 26 | Ht 63.5 in | Wt 286.0 lb

## 2013-12-17 DIAGNOSIS — K5730 Diverticulosis of large intestine without perforation or abscess without bleeding: Secondary | ICD-10-CM

## 2013-12-17 DIAGNOSIS — K644 Residual hemorrhoidal skin tags: Secondary | ICD-10-CM

## 2013-12-17 DIAGNOSIS — Z1211 Encounter for screening for malignant neoplasm of colon: Secondary | ICD-10-CM

## 2013-12-17 MED ORDER — SODIUM CHLORIDE 0.9 % IV SOLN: 500.0000 mL | INTRAVENOUS | Status: DC

## 2013-12-17 NOTE — Progress Notes (Signed)
Procedure ends, to recovery, report given and VSS. 

## 2013-12-17 NOTE — Patient Instructions (Signed)
Discharge instructions given with verbal understanding. Handouts on diverticulosis and hemorrhoids. Resume previous medications. YOU HAD AN ENDOSCOPIC PROCEDURE TODAY AT THE [facility]: Refer to the procedure report that was given to you for any specific questions about what was found during the examination.  If the procedure report does not answer your questions, please call your gastroenterologist to clarify.  If you requested that your care partner not be given the details of your procedure findings, then the procedure report has been included in a sealed envelope for you to review at your convenience later.  YOU SHOULD EXPECT: Some feelings of bloating in the abdomen. Passage of more gas than usual.  Walking can help get rid of the air that was put into your GI tract during the procedure and reduce the bloating. If you had a lower endoscopy (such as a colonoscopy or flexible sigmoidoscopy) you may notice spotting of blood in your stool or on the toilet paper. If you underwent a bowel prep for your procedure, then you may not have a normal bowel movement for a few days.  DIET: Your first meal following the procedure should be a light meal and then it is ok to progress to your normal diet.  A half-sandwich or bowl of soup is an example of a good first meal.  Heavy or fried foods are harder to digest and may make you feel nauseous or bloated.  Likewise meals heavy in dairy and vegetables can cause extra gas to form and this can also increase the bloating.  Drink plenty of fluids but you should avoid alcoholic beverages for 24 hours.  ACTIVITY: Your care partner should take you home directly after the procedure.  You should plan to take it easy, moving slowly for the rest of the day.  You can resume normal activity the day after the procedure however you should NOT DRIVE or use heavy machinery for 24 hours (because of the sedation medicines used during the test).    SYMPTOMS TO REPORT  IMMEDIATELY: A gastroenterologist can be reached at any hour.  During normal business hours, 8:30 AM to 5:00 PM Monday through Friday, call [phone].  After hours and on weekends, please call the GI answering service at [phone] who will take a message and have the physician on call contact you.   Following lower endoscopy (colonoscopy or flexible sigmoidoscopy):  Excessive amounts of blood in the stool  Significant tenderness or worsening of abdominal pains  Swelling of the abdomen that is new, acute  Fever of 100F or higher  FOLLOW UP: If any biopsies were taken you will be contacted by phone or by letter within the next 1-3 weeks.  Call your gastroenterologist if you have not heard about the biopsies in 3 weeks.  Our staff will call the home number listed on your records the next business day following your procedure to check on you and address any questions or concerns that you may have at that time regarding the information given to you following your procedure. This is a courtesy call and so if there is no answer at the home number and we have not heard from you through the emergency physician on call, we will assume that you have returned to your regular daily activities without incident.  SIGNATURES/CONFIDENTIALITY: You and/or your care partner have signed paperwork which will be entered into your electronic medical record.  These signatures attest to the fact that that the information above on your After Visit Summary   has been reviewed and is understood.  Full responsibility of the confidentiality of this discharge information lies with you and/or your care-partner. 

## 2013-12-17 NOTE — Op Note (Signed)
[facility]  [address] [place], [id number]   COLONOSCOPY PROCEDURE REPORT  PATIENT: Diana Schultz, Diana Schultz  MR#: [id number] BIRTHDATE: [birth year] , 57  yrs. old GENDER: Female ENDOSCOPIST: Milus Banister, MD REFERRED MN:OTRR Larose Kells, M.D. PROCEDURE DATE:  12/17/2013 PROCEDURE:   Colonoscopy, screening First Screening Colonoscopy - Avg.  risk and is 50 yrs.  old or older Yes.  Prior Negative Screening - Now for repeat screening. N/A  History of Adenoma - Now for follow-up colonoscopy & has been > or = to 3 yrs.  N/A  Polyps Removed Today? No.  Recommend repeat exam, <10 yrs? No. ASA CLASS:   Class II INDICATIONS:average risk screening. MEDICATIONS: MAC sedation, administered by CRNA and propofol (Diprivan) 200mg  IV  DESCRIPTION OF PROCEDURE:   After the risks benefits and alternatives of the procedure were thoroughly explained, informed consent was obtained.  A digital rectal exam revealed no abnormalities of the rectum.   The LB CF-H180AL Loaner [id number] endoscope was introduced through the anus and advanced to the cecum, which was identified by both the appendix and ileocecal valve. No adverse events experienced.   The quality of the prep was excellent.  The instrument was then slowly withdrawn as the colon was fully examined.  COLON FINDINGS: There were a few small diverticulum in the left colon.  There was a single, small non-thrombosed external anal hemorrhoid.  The examination was otherwise normal.  Retroflexed views revealed no abnormalities. The time to cecum=2 minutes 23 seconds.  Withdrawal time=10 minutes 26 seconds.  The scope was withdrawn and the procedure completed. COMPLICATIONS: There were no complications.  ENDOSCOPIC IMPRESSION: There were a few small diverticulum in the left colon. There was a single, small non-thrombosed external anal hemorrhoid. The examination was otherwise normal.  RECOMMENDATIONS: You should continue to follow colorectal  cancer screening guidelines for "routine risk" patients with a repeat colonoscopy in 10 years. There is no need for FOBT (stool) testing for at least 5 years.   eSigned:  Milus Banister, MD 12/17/2013 10:09 AM

## 2013-12-18 ENCOUNTER — Telehealth: Payer: Self-pay | Admitting: *Deleted

## 2013-12-18 NOTE — Telephone Encounter (Signed)
  Follow up Call-  Call back number 12/17/2013  Post procedure Call Back phone  # [phone]  Permission to leave phone message Yes   [facility]

## 2013-12-19 ENCOUNTER — Encounter (HOSPITAL_COMMUNITY): Payer: Self-pay

## 2013-12-19 ENCOUNTER — Other Ambulatory Visit: Payer: Self-pay | Admitting: Obstetrics & Gynecology

## 2013-12-19 ENCOUNTER — Encounter (HOSPITAL_COMMUNITY)
Admission: RE | Admit: 2013-12-19 | Discharge: 2013-12-19 | Disposition: A | Discharge status: Home / Self Care | Payer: 59 | Source: Ambulatory Visit | Attending: Obstetrics & Gynecology | Admitting: Obstetrics & Gynecology

## 2013-12-19 DIAGNOSIS — Z01812 Encounter for preprocedural laboratory examination: Secondary | ICD-10-CM | POA: Insufficient documentation

## 2013-12-19 HISTORY — DX: Type 2 diabetes mellitus without complications: E11.9

## 2013-12-19 LAB — TYPE AND SCREEN
ABO/RH(D): AB POS
Antibody Screen: NEGATIVE

## 2013-12-19 LAB — CBC
HCT: 38.7 % (ref 36.0–46.0)
Hemoglobin: 12.7 g/dL (ref 12.0–15.0)
MCH: 25 pg — ABNORMAL LOW (ref 26.0–34.0)
MCHC: 32.8 g/dL (ref 30.0–36.0)
MCV: 76 fL — AB (ref 78.0–100.0)
Platelets: 226 10*3/uL (ref 150–400)
RBC: 5.09 MIL/uL (ref 3.87–5.11)
RDW: 15.2 % (ref 11.5–15.5)
WBC: 10.7 10*3/uL — ABNORMAL HIGH (ref 4.0–10.5)

## 2013-12-19 LAB — BASIC METABOLIC PANEL
Anion gap: 10 (ref 5–15)
BUN: 11 mg/dL (ref 6–23)
CHLORIDE: 101 meq/L (ref 96–112)
CO2: 27 meq/L (ref 19–32)
Calcium: 9.5 mg/dL (ref 8.4–10.5)
Creatinine, Ser: 0.7 mg/dL (ref 0.50–1.10)
GFR calc Af Amer: >90 mL/min (ref >90)
GFR calc non Af Amer: >90 mL/min (ref >90)
Glucose, Bld: 152 mg/dL — ABNORMAL HIGH (ref 70–99)
Potassium: 3.5 mEq/L — ABNORMAL LOW (ref 3.7–5.3)
SODIUM: 138 meq/L (ref 137–147)

## 2013-12-19 LAB — ABO/RH: ABO/RH(D): AB POS

## 2013-12-19 NOTE — Patient Instructions (Addendum)
   Your procedure is scheduled on:  Monday, July 13  Enter through the [employer] [facility] at:  [place] up the phone at the desk and dial [phone] and inform us of your arrival.  Please call this number if you have any problems the morning of surgery: [phone]  Remember: Do not eat food after midnight: Sunday Do not drink clear liquids after: 7 AM Monday, day of surgery Take these medicines the morning of surgery with a SIP OF WATER:  None  Do not wear jewelry, make-up, or FINGER nail polish No metal in your hair or on your body. Do not wear lotions, powders, perfumes.  You may wear deodorant.  Do not bring valuables to the hospital. Contacts, dentures or bridgework may not be worn into surgery.  Leave suitcase in the car. After Surgery it may be brought to your room. For patients being admitted to the hospital, checkout time is 11:00am the day of discharge.  Home with husband Mateo Flow cell [phone]

## 2013-12-30 MED ORDER — DEXTROSE 5 % IV SOLN
2.0000 g | INTRAVENOUS | Status: AC
  Administered 2013-12-31: 2 g via INTRAVENOUS
  Filled 2013-12-30: qty 2

## 2013-12-31 ENCOUNTER — Encounter (HOSPITAL_COMMUNITY)
Admission: RE | Disposition: A | Discharge status: Home / Self Care | Payer: Self-pay | Source: Ambulatory Visit | Attending: Obstetrics & Gynecology

## 2013-12-31 ENCOUNTER — Encounter (HOSPITAL_COMMUNITY): Payer: Self-pay | Admitting: *Deleted

## 2013-12-31 ENCOUNTER — Encounter (HOSPITAL_COMMUNITY): Payer: 59 | Admitting: Anesthesiology

## 2013-12-31 ENCOUNTER — Observation Stay (HOSPITAL_COMMUNITY)
Admission: RE | Admit: 2013-12-31 | Discharge: 2014-01-01 | Disposition: A | Discharge status: Home / Self Care | Payer: 59 | Source: Ambulatory Visit | Attending: Obstetrics & Gynecology | Admitting: Obstetrics & Gynecology

## 2013-12-31 ENCOUNTER — Ambulatory Visit (HOSPITAL_COMMUNITY): Payer: 59 | Admitting: Anesthesiology

## 2013-12-31 DIAGNOSIS — G8929 Other chronic pain: Principal | ICD-10-CM | POA: Insufficient documentation

## 2013-12-31 DIAGNOSIS — Z8249 Family history of ischemic heart disease and other diseases of the circulatory system: Secondary | ICD-10-CM | POA: Insufficient documentation

## 2013-12-31 DIAGNOSIS — R102 Pelvic and perineal pain: Secondary | ICD-10-CM

## 2013-12-31 DIAGNOSIS — Z6841 Body Mass Index (BMI) 40.0 and over, adult: Secondary | ICD-10-CM | POA: Insufficient documentation

## 2013-12-31 DIAGNOSIS — N83209 Unspecified ovarian cyst, unspecified side: Secondary | ICD-10-CM | POA: Insufficient documentation

## 2013-12-31 DIAGNOSIS — F329 Major depressive disorder, single episode, unspecified: Secondary | ICD-10-CM | POA: Insufficient documentation

## 2013-12-31 DIAGNOSIS — F3289 Other specified depressive episodes: Secondary | ICD-10-CM | POA: Insufficient documentation

## 2013-12-31 DIAGNOSIS — Z86718 Personal history of other venous thrombosis and embolism: Secondary | ICD-10-CM | POA: Insufficient documentation

## 2013-12-31 DIAGNOSIS — K5732 Diverticulitis of large intestine without perforation or abscess without bleeding: Secondary | ICD-10-CM | POA: Insufficient documentation

## 2013-12-31 DIAGNOSIS — N949 Unspecified condition associated with female genital organs and menstrual cycle: Secondary | ICD-10-CM | POA: Insufficient documentation

## 2013-12-31 DIAGNOSIS — I10 Essential (primary) hypertension: Secondary | ICD-10-CM | POA: Insufficient documentation

## 2013-12-31 DIAGNOSIS — N857 Hematometra: Secondary | ICD-10-CM | POA: Insufficient documentation

## 2013-12-31 DIAGNOSIS — D251 Intramural leiomyoma of uterus: Secondary | ICD-10-CM | POA: Insufficient documentation

## 2013-12-31 DIAGNOSIS — Z823 Family history of stroke: Secondary | ICD-10-CM | POA: Insufficient documentation

## 2013-12-31 DIAGNOSIS — Z9889 Other specified postprocedural states: Secondary | ICD-10-CM

## 2013-12-31 DIAGNOSIS — E119 Type 2 diabetes mellitus without complications: Secondary | ICD-10-CM | POA: Insufficient documentation

## 2013-12-31 DIAGNOSIS — K429 Umbilical hernia without obstruction or gangrene: Secondary | ICD-10-CM | POA: Insufficient documentation

## 2013-12-31 HISTORY — PX: VAGINAL HYSTERECTOMY: SHX2639

## 2013-12-31 LAB — PREGNANCY, URINE: Preg Test, Ur: NEGATIVE

## 2013-12-31 LAB — CBC
HEMATOCRIT: 39.7 % (ref 36.0–46.0)
Hemoglobin: 13.1 g/dL (ref 12.0–15.0)
MCH: 25.2 pg — ABNORMAL LOW (ref 26.0–34.0)
MCHC: 33 g/dL (ref 30.0–36.0)
MCV: 76.3 fL — ABNORMAL LOW (ref 78.0–100.0)
Platelets: 243 10*3/uL (ref 150–400)
RBC: 5.2 MIL/uL — AB (ref 3.87–5.11)
RDW: 15 % (ref 11.5–15.5)
WBC: 8.1 10*3/uL (ref 4.0–10.5)

## 2013-12-31 LAB — GLUCOSE, CAPILLARY: Glucose-Capillary: 130 mg/dL — ABNORMAL HIGH (ref 70–99)

## 2013-12-31 SURGERY — HYSTERECTOMY, VAGINAL
Anesthesia: General | Site: Vagina

## 2013-12-31 MED ORDER — SUCCINYLCHOLINE CHLORIDE 20 MG/ML IJ SOLN
INTRAMUSCULAR | Status: DC | PRN
  Administered 2013-12-31: 100 mg via INTRAVENOUS

## 2013-12-31 MED ORDER — DOCUSATE SODIUM 100 MG PO CAPS
100.0000 mg | ORAL_CAPSULE | Freq: Two times a day (BID) | ORAL | Status: DC
  Administered 2013-12-31 – 2014-01-01 (×2): 100 mg via ORAL
  Filled 2013-12-31 (×2): qty 1

## 2013-12-31 MED ORDER — SODIUM CHLORIDE 0.9 % IJ SOLN
INTRAMUSCULAR | Status: AC
  Filled 2013-12-31: qty 50

## 2013-12-31 MED ORDER — VASOPRESSIN 20 UNIT/ML IJ SOLN
INTRAVENOUS | Status: DC | PRN
  Administered 2013-12-31: 12:00:00

## 2013-12-31 MED ORDER — LIDOCAINE HCL (CARDIAC) 20 MG/ML IV SOLN
INTRAVENOUS | Status: DC | PRN
  Administered 2013-12-31: 60 mg via INTRAVENOUS

## 2013-12-31 MED ORDER — OXYCODONE-ACETAMINOPHEN 5-325 MG PO TABS
1.0000 | ORAL_TABLET | ORAL | Status: DC | PRN
  Administered 2014-01-01: 2 via ORAL
  Filled 2013-12-31: qty 2

## 2013-12-31 MED ORDER — SODIUM CHLORIDE 0.9 % IJ SOLN: 9.0000 mL | INTRAMUSCULAR | Status: DC | PRN

## 2013-12-31 MED ORDER — ONDANSETRON HCL 4 MG/2ML IJ SOLN
INTRAMUSCULAR | Status: DC | PRN
  Administered 2013-12-31: 4 mg via INTRAVENOUS

## 2013-12-31 MED ORDER — ESTRADIOL 0.1 MG/GM VA CREA
TOPICAL_CREAM | VAGINAL | Status: AC
  Filled 2013-12-31: qty 42.5

## 2013-12-31 MED ORDER — HYDROMORPHONE 0.3 MG/ML IV SOLN
INTRAVENOUS | Status: AC
  Administered 2013-12-31: 17:00:00 via INTRAVENOUS
  Administered 2013-12-31: 2.1 mg via INTRAVENOUS
  Administered 2014-01-01: 1.2 mg via INTRAVENOUS
  Filled 2013-12-31: qty 25

## 2013-12-31 MED ORDER — LACTATED RINGERS IV SOLN: INTRAVENOUS | Status: DC

## 2013-12-31 MED ORDER — DEXTROSE-NACL 5-0.9 % IV SOLN
INTRAVENOUS | Status: DC
Start: 2013-12-31 — End: 2014-01-01

## 2013-12-31 MED ORDER — HYDROMORPHONE HCL PF 1 MG/ML IJ SOLN
INTRAMUSCULAR | Status: AC
  Filled 2013-12-31: qty 1

## 2013-12-31 MED ORDER — MIDAZOLAM HCL 2 MG/2ML IJ SOLN
INTRAMUSCULAR | Status: DC | PRN
  Administered 2013-12-31: 2 mg via INTRAVENOUS

## 2013-12-31 MED ORDER — KETOROLAC TROMETHAMINE 30 MG/ML IJ SOLN
INTRAMUSCULAR | Status: DC | PRN
  Administered 2013-12-31: 30 mg via INTRAVENOUS

## 2013-12-31 MED ORDER — ONDANSETRON HCL 4 MG PO TABS: 4.0000 mg | ORAL_TABLET | Freq: Four times a day (QID) | ORAL | Status: DC | PRN

## 2013-12-31 MED ORDER — ROCURONIUM BROMIDE 100 MG/10ML IV SOLN
INTRAVENOUS | Status: DC | PRN
  Administered 2013-12-31: 5 mg via INTRAVENOUS

## 2013-12-31 MED ORDER — PROPOFOL 10 MG/ML IV EMUL
INTRAVENOUS | Status: AC
  Filled 2013-12-31: qty 20

## 2013-12-31 MED ORDER — SODIUM CHLORIDE 0.9 % IJ SOLN
INTRAMUSCULAR | Status: DC | PRN
  Administered 2013-12-31: 50 mL

## 2013-12-31 MED ORDER — FENTANYL CITRATE 0.05 MG/ML IJ SOLN
INTRAMUSCULAR | Status: AC
  Filled 2013-12-31: qty 2

## 2013-12-31 MED ORDER — ONDANSETRON HCL 4 MG/2ML IJ SOLN
INTRAMUSCULAR | Status: AC
  Filled 2013-12-31: qty 2

## 2013-12-31 MED ORDER — PANTOPRAZOLE SODIUM 40 MG PO TBEC: 40.0000 mg | DELAYED_RELEASE_TABLET | Freq: Every day | ORAL | Status: DC

## 2013-12-31 MED ORDER — KETOROLAC TROMETHAMINE 30 MG/ML IJ SOLN
30.0000 mg | Freq: Four times a day (QID) | INTRAMUSCULAR | Status: DC
  Administered 2013-12-31 – 2014-01-01 (×3): 30 mg via INTRAVENOUS
  Filled 2013-12-31 (×3): qty 1

## 2013-12-31 MED ORDER — KETOROLAC TROMETHAMINE 30 MG/ML IJ SOLN
INTRAMUSCULAR | Status: AC
  Filled 2013-12-31: qty 1

## 2013-12-31 MED ORDER — FENTANYL CITRATE 0.05 MG/ML IJ SOLN
INTRAMUSCULAR | Status: AC
  Filled 2013-12-31: qty 5

## 2013-12-31 MED ORDER — DIPHENHYDRAMINE HCL 12.5 MG/5ML PO ELIX: 12.5000 mg | ORAL_SOLUTION | Freq: Four times a day (QID) | ORAL | Status: DC | PRN

## 2013-12-31 MED ORDER — NALOXONE HCL 0.4 MG/ML IJ SOLN: 0.4000 mg | INTRAMUSCULAR | Status: DC | PRN

## 2013-12-31 MED ORDER — DEXAMETHASONE SODIUM PHOSPHATE 10 MG/ML IJ SOLN
INTRAMUSCULAR | Status: AC
  Filled 2013-12-31: qty 1

## 2013-12-31 MED ORDER — MIDAZOLAM HCL 2 MG/2ML IJ SOLN
INTRAMUSCULAR | Status: AC
  Filled 2013-12-31: qty 2

## 2013-12-31 MED ORDER — MENTHOL 3 MG MT LOZG: 1.0000 | LOZENGE | OROMUCOSAL | Status: DC | PRN

## 2013-12-31 MED ORDER — HYDROMORPHONE 0.3 MG/ML IV SOLN: INTRAVENOUS | Status: DC

## 2013-12-31 MED ORDER — MORPHINE SULFATE 4 MG/ML IJ SOLN: 1.0000 mg | INTRAMUSCULAR | Status: DC | PRN

## 2013-12-31 MED ORDER — LACTATED RINGERS IV SOLN
INTRAVENOUS | Status: DC
  Administered 2013-12-31 (×3): via INTRAVENOUS

## 2013-12-31 MED ORDER — KETOROLAC TROMETHAMINE 30 MG/ML IJ SOLN: 30.0000 mg | Freq: Four times a day (QID) | INTRAMUSCULAR | Status: DC

## 2013-12-31 MED ORDER — DEXAMETHASONE SODIUM PHOSPHATE 10 MG/ML IJ SOLN
INTRAMUSCULAR | Status: DC | PRN
  Administered 2013-12-31: 10 mg via INTRAVENOUS

## 2013-12-31 MED ORDER — LIDOCAINE HCL (CARDIAC) 20 MG/ML IV SOLN
INTRAVENOUS | Status: AC
  Filled 2013-12-31: qty 5

## 2013-12-31 MED ORDER — FENTANYL CITRATE 0.05 MG/ML IJ SOLN
INTRAMUSCULAR | Status: DC | PRN
  Administered 2013-12-31 (×4): 50 ug via INTRAVENOUS

## 2013-12-31 MED ORDER — GLYCOPYRROLATE 0.2 MG/ML IJ SOLN
INTRAMUSCULAR | Status: AC
  Filled 2013-12-31: qty 3

## 2013-12-31 MED ORDER — IBUPROFEN 800 MG PO TABS: 800.0000 mg | ORAL_TABLET | Freq: Three times a day (TID) | ORAL | Status: DC | PRN

## 2013-12-31 MED ORDER — ONDANSETRON HCL 4 MG/2ML IJ SOLN: 4.0000 mg | Freq: Four times a day (QID) | INTRAMUSCULAR | Status: DC | PRN

## 2013-12-31 MED ORDER — VASOPRESSIN 20 UNIT/ML IJ SOLN
INTRAMUSCULAR | Status: AC
  Filled 2013-12-31: qty 1

## 2013-12-31 MED ORDER — ROCURONIUM BROMIDE 100 MG/10ML IV SOLN
INTRAVENOUS | Status: AC
  Filled 2013-12-31: qty 1

## 2013-12-31 MED ORDER — SIMETHICONE 80 MG PO CHEW: 80.0000 mg | CHEWABLE_TABLET | Freq: Four times a day (QID) | ORAL | Status: DC | PRN

## 2013-12-31 MED ORDER — HYDROMORPHONE HCL PF 1 MG/ML IJ SOLN
0.2500 mg | INTRAMUSCULAR | Status: DC | PRN
  Administered 2013-12-31 (×3): 0.5 mg via INTRAVENOUS

## 2013-12-31 MED ORDER — DIPHENHYDRAMINE HCL 50 MG/ML IJ SOLN: 12.5000 mg | Freq: Four times a day (QID) | INTRAMUSCULAR | Status: DC | PRN

## 2013-12-31 MED ORDER — ONDANSETRON HCL 4 MG/2ML IJ SOLN
4.0000 mg | Freq: Four times a day (QID) | INTRAMUSCULAR | Status: DC | PRN
  Administered 2013-12-31: 4 mg via INTRAVENOUS
  Filled 2013-12-31: qty 2

## 2013-12-31 MED ORDER — KETOROLAC TROMETHAMINE 30 MG/ML IJ SOLN: 30.0000 mg | Freq: Once | INTRAMUSCULAR | Status: DC

## 2013-12-31 MED ORDER — NEOSTIGMINE METHYLSULFATE 10 MG/10ML IV SOLN
INTRAVENOUS | Status: AC
  Filled 2013-12-31: qty 1

## 2013-12-31 MED ORDER — PROPOFOL 10 MG/ML IV BOLUS
INTRAVENOUS | Status: DC | PRN
  Administered 2013-12-31: 100 mg via INTRAVENOUS
  Administered 2013-12-31: 250 mg via INTRAVENOUS
  Administered 2013-12-31 (×2): 50 mg via INTRAVENOUS

## 2013-12-31 MED ORDER — FENTANYL CITRATE 0.05 MG/ML IJ SOLN
25.0000 ug | INTRAMUSCULAR | Status: DC | PRN
  Administered 2013-12-31 (×3): 50 ug via INTRAVENOUS

## 2013-12-31 MED ORDER — ZOLPIDEM TARTRATE 5 MG PO TABS: 5.0000 mg | ORAL_TABLET | Freq: Every evening | ORAL | Status: DC | PRN

## 2013-12-31 SURGICAL SUPPLY — 30 items
CANISTER SUCT 3000ML (MISCELLANEOUS) ×2 IMPLANT
CLOTH BEACON ORANGE TIMEOUT ST (SAFETY) ×2 IMPLANT
CONT PATH 16OZ SNAP LID 3702 (MISCELLANEOUS) IMPLANT
COVER MAYO STAND STRL (DRAPES) ×2 IMPLANT
DECANTER SPIKE VIAL GLASS SM (MISCELLANEOUS) ×2 IMPLANT
DRAPE HYSTEROSCOPY (DRAPE) ×2 IMPLANT
DRSG TELFA 3X8 NADH (GAUZE/BANDAGES/DRESSINGS) ×2 IMPLANT
GAUZE PACKING 2X5 YD STRL (GAUZE/BANDAGES/DRESSINGS) IMPLANT
GAUZE SPONGE 4X4 16PLY XRAY LF (GAUZE/BANDAGES/DRESSINGS) ×2 IMPLANT
GLOVE BIO SURGEON STRL SZ7 (GLOVE) ×2 IMPLANT
GLOVE BIOGEL PI IND STRL 6.5 (GLOVE) ×1 IMPLANT
GLOVE BIOGEL PI IND STRL 7.0 (GLOVE) ×1 IMPLANT
GLOVE BIOGEL PI INDICATOR 6.5 (GLOVE) ×1
GLOVE BIOGEL PI INDICATOR 7.0 (GLOVE) ×1
GOWN STRL REUS W/TWL LRG LVL3 (GOWN DISPOSABLE) ×8 IMPLANT
NEEDLE SPNL 20GX3.5 QUINCKE YW (NEEDLE) IMPLANT
NS IRRIG 1000ML POUR BTL (IV SOLUTION) ×2 IMPLANT
PACK VAGINAL WOMENS (CUSTOM PROCEDURE TRAY) ×2 IMPLANT
PAD OB MATERNITY 4.3X12.25 (PERSONAL CARE ITEMS) ×2 IMPLANT
SHEARS FOC LG CVD HARMONIC 17C (MISCELLANEOUS) IMPLANT
SUT VIC AB 0 CT1 18XCR BRD8 (SUTURE) ×3 IMPLANT
SUT VIC AB 0 CT1 27 (SUTURE) ×1
SUT VIC AB 0 CT1 27XCR 8 STRN (SUTURE) ×1 IMPLANT
SUT VIC AB 0 CT1 36 (SUTURE) ×4 IMPLANT
SUT VIC AB 0 CT1 8-18 (SUTURE) ×3
SUT VICRYL 0 TIES 12 18 (SUTURE) ×2 IMPLANT
SYR 30ML LL (SYRINGE) IMPLANT
TOWEL OR 17X24 6PK STRL BLUE (TOWEL DISPOSABLE) ×4 IMPLANT
TRAY FOLEY CATH 14FR (SET/KITS/TRAYS/PACK) ×2 IMPLANT
WATER STERILE IRR 1000ML POUR (IV SOLUTION) IMPLANT

## 2013-12-31 NOTE — Progress Notes (Signed)
Patient had a foley and asked for it to be taken out in PACU, she is receiving high dose PCA and I received an order for foley from Dr Kennon Rounds. The patient refused and stated she will use  A bedpan if needed.Patient instructed not to get out of bed by herself, stated she understands

## 2013-12-31 NOTE — Transfer of Care (Signed)
Immediate Anesthesia Transfer of Care Note  Patient: Diana Schultz  Procedure(s) Performed: Procedure(s): HYSTERECTOMY VAGINAL (N/A)  Patient Location: PACU  Anesthesia Type:General  Level of Consciousness: awake, alert , oriented and patient cooperative  Airway & Oxygen Therapy: Patient Spontanous Breathing and Patient connected to nasal cannula oxygen  Post-op Assessment: Report given to PACU RN, Post -op Vital signs reviewed and stable and Patient moving all extremities X 4  Post vital signs: Reviewed and stable  Complications: No apparent anesthesia complications

## 2013-12-31 NOTE — Brief Op Note (Signed)
12/31/2013  2:39 PM  PATIENT:  Diana Schultz  48 y.o. female  PRE-OPERATIVE DIAGNOSIS:  Chronic pelvic pain, hematometra after endometrial ablation  POST-OPERATIVE DIAGNOSIS:  Chronic pelvic pain, hematometra after endometrial ablation  PROCEDURE:  Procedure(s): HYSTERECTOMY VAGINAL (N/A)  SURGEON:  Surgeon(s) and Role:    * Lavonia Drafts, MD - Primary    * Guss Bunde, MD  ANESTHESIA:   general  EBL:  Total I/O In: 2500 [I.V.:2500] Out: 650 [Urine:450; Blood:200]  BLOOD ADMINISTERED:none  DRAINS: none   LOCAL MEDICATIONS USED:  OTHER dilute Vasopressin solution  SPECIMEN:  Source of Specimen:  uterus and cervix  DISPOSITION OF SPECIMEN:  PATHOLOGY  COUNTS:  YES  TOURNIQUET:  * No tourniquets in log *  DICTATION: .Note written in EPIC  PLAN OF CARE: Admit for overnight observation  PATIENT DISPOSITION:  PACU - hemodynamically stable.   Delay start of Pharmacological VTE agent (>24hrs) due to surgical blood loss or risk of bleeding: yes

## 2013-12-31 NOTE — Anesthesia Preprocedure Evaluation (Signed)
Anesthesia Evaluation  Patient identified by MRN, date of birth, ID band Patient awake    Reviewed: Allergy & Precautions, H&P , Patient's Chart, lab work & pertinent test results, reviewed documented beta blocker date and time   Airway Mallampati: II TM Distance: >3 FB Neck ROM: full    Dental no notable dental hx.    Pulmonary  breath sounds clear to auscultation  Pulmonary exam normal       Cardiovascular hypertension (No current Rx), Rhythm:regular Rate:Normal     Neuro/Psych    GI/Hepatic   Endo/Other  diabetesMorbid obesityNo Dx or Rx   Renal/GU      Musculoskeletal   Abdominal   Peds  Hematology   Anesthesia Other Findings   Reproductive/Obstetrics                           Anesthesia Physical Anesthesia Plan  ASA: III  Anesthesia Plan:    Post-op Pain Management:    Induction: Intravenous  Airway Management Planned: LMA  Additional Equipment:   Intra-op Plan:   Post-operative Plan:   Informed Consent: I have reviewed the patients History and Physical, chart, labs and discussed the procedure including the risks, benefits and alternatives for the proposed anesthesia with the patient or authorized representative who has indicated his/her understanding and acceptance.   Dental Advisory Given and Dental advisory given  Plan Discussed with: CRNA and Surgeon  Anesthesia Plan Comments: (Discussed GA with LMA, possible sore throat, potential need to switch to ETT, N/V, pulmonary aspiration. Questions answered. )        Anesthesia Quick Evaluation

## 2013-12-31 NOTE — H&P (Signed)
48 y.o. Diana Schultz here today for discussion about hysterectomy. Had Novasure in 03/23/2013 by Dr. Ihor Dow, complicated by cervical stenosis and hematometra requiring D&C 11/01/2013. Patient reports worsening pain, no bleeding. Was seen in the MAU on 11/25/13, which showed probable small hematometra. For hysterectomy.  Past Medical History  Diagnosis Date  . Ovarian cyst   . Umbilical hernia   . Hypertension     history,lost weight, controlled with diet and exercise, no current med  . DVT (deep venous thrombosis)     Hx at age 6 blood clot in right leg, d/c birth control pills and no other problems  . Depression   . Diverticulitis   . Insulin resistance   . Diabetes mellitus without complication     borderline - no meds  . Vaginal delivery 1984, 1989   Past Surgical History  Procedure Laterality Date  . Tubal ligation    . Cholecystectomy    . Appendectomy    . Leep    . Dilation and curettage of uterus    . Dilitation & currettage/hystroscopy with novasure ablation N/A 03/23/2013    Procedure: DILATATION & CURETTAGE/HYSTEROSCOPY WITH NOVASURE ABLATION;  Surgeon: Lavonia Drafts, MD;  Location: [facility];  Service: Gynecology;  Laterality: N/A;  . Dilation and curettage of uterus N/A 11/01/2013    Procedure: DILATATION AND CURETTAGE;  Surgeon: Mora Bellman, MD;  Location: [facility];  Service: Gynecology;  Laterality: N/A;  . Ablation  03/23/13    [place]  . Colonoscopy  11/2013    [place]   History   Social History  . Marital Status: Married    Spouse Name: N/A    Number of Children: 2  . Years of Education: N/A   Occupational History  . CNA, med tech  by training, does prn works    Social History Main Topics  . Smoking status: Never Smoker   . Smokeless tobacco: Never Used  . Alcohol Use: No     Comment: rare   . Drug Use: No  . Sexual Activity: No   Other Topics Concern  . Not on file   Social History Narrative   Original from [place]   Family History  Problem  Relation Age of Onset  . Cancer Mother     type? ovarian?  Marland Kitchen Hypertension Mother   . Heart failure Paternal Grandfather   . Diabetes Mother   . CAD Other     GP, late in like  . Stroke Mother     early in life  . Colon cancer Neg Hx   . Breast cancer Neg Hx        Review of Systems:   Pertinent items are noted in HPI.  Objective:   Physical Exam  BP BP 159/96  Pulse 53  Temp(Src) 98.2 F (36.8 C) (Oral)  Resp 18  SpO2 99%  Gen: NAD  Abd: Soft, diffuse moderate pelvic pain, nondistended  Pelvic: Deferred   CBC    Component Value Date/Time   WBC 8.1 12/31/2013 0930   RBC 5.20* 12/31/2013 0930   HGB 13.1 12/31/2013 0930   HCT 39.7 12/31/2013 0930   PLT 243 12/31/2013 0930   MCV 76.3* 12/31/2013 0930   MCH 25.2* 12/31/2013 0930   MCHC 33.0 12/31/2013 0930   RDW 15.0 12/31/2013 0930   LYMPHSABS 2.8 02/25/2013 2038   MONOABS 0.7 02/25/2013 2038   EOSABS 0.3 02/25/2013 2038   BASOSABS 0.0 02/25/2013 2038     Labs and Imaging  11/25/2013 TRANSVAGINAL ULTRASOUND  OF PELVIS CLINICAL DATA: Chronic pelvic pain worse today. D and C 11/01/2013 and 03/23/2013. Bilateral tubal ligation. Previous [facility] ablation. TECHNIQUE: Transvaginal ultrasound examination of the pelvis was performed including evaluation of the uterus, ovaries, adnexal regions, and pelvic cul-de-sac. COMPARISON: 11/01/2013 FINDINGS: Exam is somewhat difficult due to obesity and pain. Uterus Measurements: 5.4 x 5.8 x 10.8 cm. Two small adjacent ovoid hypoechoic masses over the periphery of the anterior uterine body measuring 5 x 7 x 7 mm and 7 x 8 x 8 mm respectively likely small fibroids. Endometrium Thickness: 19 mm. Focal ovoid mildly complex fluid collection over the lower uterine endometrial canal as these findings are likely related to patient's recent dilatation and curetteage. The endometrium was better demonstrated on the prior exam. Right ovary Measurements: 1.7 x 1.4 x 2.3 cm. Normal appearance/no adnexal mass. Left ovary Not  visualized. Other findings: No free fluid IMPRESSION: Nonspecific endometrium thickening with focal fluid collection over the lower uterine endometrial canal. Findings likely related to patient's recent dilatation and curettage with hemorrhagic debris. Cannot exclude endometritis. Recommend clinical correlation and follow-up ultrasound in 4 weeks. Two small uterine fibroids. Electronically Signed By: Marin Olp M.D. On: 11/25/2013 15:37  11/01/2013 TRANSVAGINAL ULTRASOUND OF PELVIS CLINICAL DATA: Right lower quadrant pain. History of ablation 02/2013 TECHNIQUE: Transvaginal ultrasound examination of the pelvis was performed including evaluation of the uterus, ovaries, adnexal regions, and pelvic cul-de-sac. COMPARISON: US TRANSVAGINAL NON-OB dated 03/06/2013 FINDINGS: Uterus Measurements: 9.8 x 5.7 x 5.6 cm, anteverted. No fibroids or other mass visualized. Endometrium Thickness: 4.8 mm. Complex fluid collection demonstrated in the endometrium with ovoid solid component suggesting endometrial mass or polyp. Appearances are similar to prior study. Right ovary Measurements: 2.5 x 1.4 x 1.9 cm. Normal appearance/no adnexal mass. Left ovary Measurements: 1.9 x 1.2 x 1.4 cm. Normal appearance/no adnexal mass. Other findings: No free fluid IMPRESSION: Persistent finding of complex fluid collection in the endometrium with a solid polypoid component suggesting endometrial mass or polyp. Ovaries are unremarkable. Electronically Signed By: Lucienne Capers M.D. On: 11/01/2013 05:52  Assessment & Plan:   Patient desires definitive management with hysterectomy. I proposed doing a vaginal hysterectomy (VH) and prophylactic bilateral salpingectomy. No indication for oophorectomy. Prophylactic oophorectomy and associated surgical menopause could have major health risks such as loss of ovarian hormones that could lead to vasomotor symptoms and osteoporosis, increased risk of cardiovascular disease, dementia, procedure-related  complications and increased overall mortality. However, patient was counseled regarding a prophylactic bilateral salpingectomy given that a growing body of knowledge reveals that the majority of cases of high grade serous "ovarian" cancer actually are fallopian tube cancers.The precursor lesions are though to arise from the fimbriated end of the fallopian tube and the cancer can have metastases from this primary lesion; and removal of fallopian tubes do not result in any known hormonal imbalance. Patient agrees with this proposed surgery. The risks of surgery were discussed in detail with the patient including but not limited to: bleeding which may require transfusion or reoperation; infection which may require antibiotics; injury to bowel, bladder, ureters or other surrounding organs; need for additional procedures including laparotomy; thromboembolic phenomenon, incisional problems and other postoperative/anesthesia complications. Patient was also advised that she will remain in house for 1 day; and expected recovery time after a vaginal hysterectomy is 4-6 weeks. Likelihood of success in alleviating the patient's symptoms was discussed. Routine postoperative instructions will be reviewed with the patient and her family in detail after surgery.

## 2013-12-31 NOTE — Progress Notes (Signed)
Day of Surgery Procedure(s) (LRB): HYSTERECTOMY VAGINAL (N/A)  Subjective: Patient reports pain which was not controlled well with IVP meds.   Objective: I have reviewed patient's vital signs.  General: alert and mild distress  Assessment: s/p Procedure(s): HYSTERECTOMY VAGINAL (N/A): stable and post op pain not well controlled  Plan: will add a Dilaudid PCA  LOS: 0 days    HARRAWAY-SMITH, Diana Schultz 12/31/2013, 4:37 PM

## 2013-12-31 NOTE — Anesthesia Postprocedure Evaluation (Signed)
  Anesthesia Post-op Note  Patient: Diana Schultz  Procedure(s) Performed: Procedure(s): HYSTERECTOMY VAGINAL (N/A)  Patient Location: PACU  Anesthesia Type:General  Level of Consciousness: awake, alert  and oriented  Airway and Oxygen Therapy: Patient Spontanous Breathing  Post-op Pain: mild  Post-op Assessment: Post-op Vital signs reviewed, Patient's Cardiovascular Status Stable, Respiratory Function Stable, Patent Airway, No signs of Nausea or vomiting and Pain level controlled  Post-op Vital Signs: Reviewed and stable  Last Vitals:  Filed Vitals:   12/31/13 1530  BP: 149/83  Pulse: 71  Temp:   Resp: 23    Complications: No apparent anesthesia complications

## 2013-12-31 NOTE — Op Note (Signed)
12/31/2013  2:39 PM  PATIENT:  Diana Schultz  48 y.o. female  PRE-OPERATIVE DIAGNOSIS:  Chronic pelvic pain, hematometra after endometrial ablation  POST-OPERATIVE DIAGNOSIS:  Chronic pelvic pain, hematometra after endometrial ablation  PROCEDURE:  Procedure(s): HYSTERECTOMY VAGINAL (N/A)  SURGEON:  Surgeon(s) and Role:    * Lavonia Drafts, MD - Primary    * Guss Bunde, MD  ANESTHESIA:   general  EBL:  Total I/O In: 2500 [I.V.:2500] Out: 650 [Urine:450; Blood:200]  BLOOD ADMINISTERED:none  DRAINS: none   LOCAL MEDICATIONS USED:  OTHER dilute Vasopressin solution  SPECIMEN:  Source of Specimen:  uterus and cervix  DISPOSITION OF SPECIMEN:  PATHOLOGY  COUNTS:  YES  TOURNIQUET:  * No tourniquets in log *  DICTATION: .Note written in EPIC  PLAN OF CARE: Admit for overnight observation  PATIENT DISPOSITION:  PACU - hemodynamically stable.   Delay start of Pharmacological VTE agent (>24hrs) due to surgical blood loss or risk of bleeding: yes  INDICATIONS: The patient is a 48 y.o. [id number] with history of symptomatic pelvic pain after an endometrial ablation. The patient made a decision to undergo definite surgical treatment. On the preoperative visit, the risks, benefits, indications, and alternatives of the procedure were reviewed with the patient.  On the day of surgery, the risks of surgery were again discussed with the patient including but not limited to: bleeding which may require transfusion or reoperation; infection which may require antibiotics; injury to bowel, bladder, ureters or other surrounding organs; need for additional procedures; thromboembolic phenomenon, incisional problems and other postoperative/anesthesia complications. Written informed consent was obtained.    OPERATIVE FINDINGS: A 8 week size uterus with normal tubes and ovaries bilaterally.  COMPLICATIONS:  None immediate.  DESCRIPTION OF PROCEDURE:  The patient received intravenous  antibiotics and had sequential compression devices applied to her lower extremities while in the preoperative area.  She was then taken to the operating room where general anesthesia was administered and was found to be adequate.  She was placed in the dorsal lithotomy position, and was prepped and draped in a sterile manner.  The patients bladder was drained with a red rubber catheter. After an adequate timeout was performed, attention was turned to her pelvis.  A weighted speculum was then placed in the vagina, and the anterior and posterior lips of the cervix were grasped bilaterally with tenaculums.  The cervix was then injected circumferentially with a dilute Vasopression solution.  The cervix was then circumferentially incised, and the bladder was dissected off the pubocervical fascia without complication.  Th posterior cul-de-sac was entered sharply without difficulty. A suture was placed on the posterior vagina.  A long weighted speculum was inserted into the posterior cul-de-sac.  The Heaney clamp was then used to clamp the uterosacral ligaments on either side.  They were then cut and sutured ligated with 0 Vicryl, and the ligated uterosacral ligaments were transfixed to the posterior lateral vaginal epithelium to further support the vagina and provide hemostasis. Of note, all sutures used in this case were 0 Vicryl unless otherwise noted.   The cardinal ligaments were then clamped, cut and ligated. The anterior cul-de-sac was then entered sharpely. The uterine vessels and broad ligaments were then serially clamped with the Heaney clamps, cut, and suture ligated on both sides.  Excellent hemostasis was noted at this point. The uterus was then delivered via the posterior cul-de-sac, and the cornua were clamped with the Heaney clamps, transected, and the uterus was delivered and sent to  pathology. These pedicles were then suture ligated to ensure hemostasis. The fallopian tubes were too high reach to resect.   All pedicles from the uterosacral ligament to the cornua were examined hemostasis was confirmed.  The vaginal cuff was reefed in a running locked fashion then reapproximated using figure of eight sutures care was given to incorporate the uterosacral pedicles bilaterally.  All instruments were then removed from the pelvis and a vaginal packing saturated with estrogen cream was placed.  The patient tolerated the procedure well.  All instruments, needles, and sponge counts were correct x 2. The patient was taken to the recovery room in stable condition.

## 2014-01-01 ENCOUNTER — Encounter: Payer: Self-pay | Admitting: Obstetrics & Gynecology

## 2014-01-01 ENCOUNTER — Encounter (HOSPITAL_COMMUNITY): Payer: Self-pay | Admitting: Obstetrics & Gynecology

## 2014-01-01 LAB — CBC
HCT: 36.8 % (ref 36.0–46.0)
Hemoglobin: 12 g/dL (ref 12.0–15.0)
MCH: 24.7 pg — ABNORMAL LOW (ref 26.0–34.0)
MCHC: 32.6 g/dL (ref 30.0–36.0)
MCV: 75.7 fL — AB (ref 78.0–100.0)
Platelets: 246 10*3/uL (ref 150–400)
RBC: 4.86 MIL/uL (ref 3.87–5.11)
RDW: 15.3 % (ref 11.5–15.5)
WBC: 16.1 10*3/uL — ABNORMAL HIGH (ref 4.0–10.5)

## 2014-01-01 MED ORDER — IBUPROFEN 800 MG PO TABS
800.0000 mg | ORAL_TABLET | Freq: Three times a day (TID) | ORAL | Status: DC | PRN
Start: 2014-01-01 — End: 2014-06-03

## 2014-01-01 MED ORDER — HYDROCHLOROTHIAZIDE 25 MG PO TABS
25.0000 mg | ORAL_TABLET | Freq: Every day | ORAL | Status: DC
  Administered 2014-01-01: 25 mg via ORAL
  Filled 2014-01-01: qty 1

## 2014-01-01 MED ORDER — HYDROCHLOROTHIAZIDE 25 MG PO TABS: 25.0000 mg | ORAL_TABLET | Freq: Every day | ORAL | Status: DC

## 2014-01-01 MED ORDER — METOCLOPRAMIDE HCL 5 MG/ML IJ SOLN
10.0000 mg | Freq: Once | INTRAMUSCULAR | Status: AC
  Administered 2014-01-01: 10 mg via INTRAVENOUS
  Filled 2014-01-01: qty 2

## 2014-01-01 MED ORDER — OXYCODONE-ACETAMINOPHEN 5-325 MG PO TABS: 1.0000 | ORAL_TABLET | ORAL | Status: DC | PRN

## 2014-01-01 NOTE — Progress Notes (Signed)
Pt teaching complete  Out in wheelchair   

## 2014-01-01 NOTE — Progress Notes (Signed)
UR chart review completed.  

## 2014-01-01 NOTE — Addendum Note (Signed)
Addendum created 01/01/14 0740 by Talbot Grumbling, CRNA   Modules edited: Notes Section   Notes Section:  File: [id number]

## 2014-01-01 NOTE — Anesthesia Postprocedure Evaluation (Signed)
Anesthesia Post Note  Patient: Diana Schultz  Procedure(s) Performed: Procedure(s) (LRB): HYSTERECTOMY VAGINAL (N/A)  Anesthesia type: General  Patient location: Mother/Baby  Post pain: Pain level controlled  Post assessment: Post-op Vital signs reviewed  Last Vitals:  Filed Vitals:   01/01/14 0623  BP:   Pulse:   Temp:   Resp: 20    Post vital signs: Reviewed  Level of consciousness: awake and alert   Complications: No apparent anesthesia complications

## 2014-01-01 NOTE — Discharge Summary (Signed)
Physician Discharge Summary  Patient ID: Diana Schultz MRN: [id number] DOB/AGE: [birth year] 50 y.o.  Admit date: 12/31/2013 Discharge date: 01/01/2014  Admission Diagnoses: pelvic pain after endometrial ablation   Discharge Diagnoses: pelvic pain after endometrial ablation; hypertension  Active Problems:   Post-operative state   Discharged Condition: good  Hospital Course: Pt was admitted for observation after an uncomplicated total vaginal hysterectomy.  She had some increased pain after the procedure requiring a Dilaudid PCA.  Her pain was subsequently under control.  She was able to void without difficulty and tolerate a regular diet.  She has a h/o untreated hypertension and her BP's were elevated on her initial visit and throughout her visit so she was started on HCTZ.  She has no additional problems and expressed that she was redy for discharge.   Consults: None  Significant Diagnostic Studies: labs: CBC  Treatments: IV hydration, analgesia: Dilaudid and surgery: TVH  Discharge Exam: Blood pressure 130/71, pulse 67, temperature 98.5 F (36.9 C), temperature source Oral, resp. rate 21, SpO2 95.00%. General appearance: alert and no distress Resp: clear to auscultation bilaterally Cardio: regular rate and rhythm, S1, S2 normal, no murmur, click, rub or gallop GI: soft, non-tender; bowel sounds normal; no masses,  no organomegaly  CBC    Component Value Date/Time   WBC 16.1* 01/01/2014 0540   RBC 4.86 01/01/2014 0540   HGB 12.0 01/01/2014 0540   HCT 36.8 01/01/2014 0540   PLT 246 01/01/2014 0540   MCV 75.7* 01/01/2014 0540   MCH 24.7* 01/01/2014 0540   MCHC 32.6 01/01/2014 0540   RDW 15.3 01/01/2014 0540   LYMPHSABS 2.8 02/25/2013 2038   MONOABS 0.7 02/25/2013 2038   EOSABS 0.3 02/25/2013 2038   BASOSABS 0.0 02/25/2013 2038    Disposition: 01-Home or Self Care  Discharge Instructions   Call MD for:  difficulty breathing, headache or visual disturbances    Complete by:  As directed       Call MD for:  extreme fatigue    Complete by:  As directed      Call MD for:  hives    Complete by:  As directed      Call MD for:  persistant dizziness or light-headedness    Complete by:  As directed      Call MD for:  persistant nausea and vomiting    Complete by:  As directed      Call MD for:  redness, tenderness, or signs of infection (pain, swelling, redness, odor or green/yellow discharge around incision site)    Complete by:  As directed      Call MD for:  severe uncontrolled pain    Complete by:  As directed      Call MD for:  temperature >100.4    Complete by:  As directed      Call MD for:    Complete by:  As directed   Heavy vaginal bleeding     Diet - low sodium heart healthy    Complete by:  As directed      Driving Restrictions    Complete by:  As directed   No driving for 2 weeks or while on pain meds by mouth     Increase activity slowly    Complete by:  As directed      Lifting restrictions    Complete by:  As directed   No heavy lifting for 6 weeks     Sexual Activity Restrictions  Complete by:  As directed   No sexual activity for 6 weeks            Medication List         hydrochlorothiazide 25 MG tablet  Commonly known as:  HYDRODIURIL  Take 1 tablet (25 mg total) by mouth daily.     ibuprofen 800 MG tablet  Commonly known as:  ADVIL,MOTRIN  Take 1 tablet (800 mg total) by mouth every 8 (eight) hours as needed (mild pain).     oxyCODONE-acetaminophen 5-325 MG per tablet  Commonly known as:  PERCOCET/ROXICET  Take 1-2 tablets by mouth every 4 (four) hours as needed for severe pain (moderate to severe pain (when tolerating fluids)).           Follow-up Information   Follow up with Lavonia Drafts, MD In 2 weeks.   Specialty:  Obstetrics and Gynecology   Contact information:   [place] [address] [phone]       Signed: Lavonia Drafts 01/01/2014, 9:28 AM

## 2014-01-01 NOTE — Discharge Instructions (Signed)

## 2014-01-14 ENCOUNTER — Ambulatory Visit (INDEPENDENT_AMBULATORY_CARE_PROVIDER_SITE_OTHER): Payer: 59 | Admitting: Obstetrics & Gynecology

## 2014-01-14 ENCOUNTER — Encounter: Payer: Self-pay | Admitting: Obstetrics & Gynecology

## 2014-01-14 VITALS — BP 141/76 | HR 65 | Temp 97.5°F | Ht 60.0 in | Wt 283.2 lb

## 2014-01-14 DIAGNOSIS — Z9889 Other specified postprocedural states: Secondary | ICD-10-CM

## 2014-01-14 NOTE — Patient Instructions (Signed)

## 2014-01-14 NOTE — Progress Notes (Signed)
Subjective:     Patient ID: Diana Schultz, female   DOB: 07/22/1965, 48 y.o.   MRN: [id number]  HPI Pt presents for 2 weeks post op check.  Pt reports that she feels good. She reports normal bowel and bladder function.  Her appetite is decreased but, she is happy for that.   Good pain control.   Review of Systems     Objective:   Physical Exam BP 141/76  Pulse 65  Temp(Src) 97.5 F (36.4 C) (Oral)  Ht 5' (1.524 m)  Wt 283 lb 3.2 oz (128.459 kg)  BMI 55.31 kg/m2  LMP 12/01/2013 Pt in NAD Abd: soft; NT, ND   12/31/2013 Diagnosis Uterus and cervix, 151.9 grams - ENDOMETRIUM: BENIGN DISORDERED PROLIFERATIVE ENDOMETRIUM, NO ATYPIA, HYPERPLASIA OR MALIGNANCY. - MYOMETRIUM: LEIOMYOMA. - CERVIX: BENIGN SQUAMOUS MUCOSA AND ENDOCERVICAL MUCOSA, NO DYSPLASIA OR MALIGNANCY. 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

## 2014-02-11 ENCOUNTER — Encounter: Payer: Self-pay | Admitting: Obstetrics & Gynecology

## 2014-02-11 ENCOUNTER — Ambulatory Visit (INDEPENDENT_AMBULATORY_CARE_PROVIDER_SITE_OTHER): Payer: 59 | Admitting: Obstetrics & Gynecology

## 2014-02-11 VITALS — BP 140/78 | HR 66 | Temp 98.0°F | Resp 20 | Ht 63.5 in | Wt 284.9 lb

## 2014-02-11 DIAGNOSIS — Z9889 Other specified postprocedural states: Secondary | ICD-10-CM

## 2014-02-11 DIAGNOSIS — N949 Unspecified condition associated with female genital organs and menstrual cycle: Secondary | ICD-10-CM

## 2014-02-11 DIAGNOSIS — R102 Pelvic and perineal pain: Secondary | ICD-10-CM

## 2014-02-11 NOTE — Progress Notes (Signed)
Pt has menstrual-like symptoms at the beginning of each month, i.e. Irritability, abdominal bloating and cramping.

## 2014-02-11 NOTE — Progress Notes (Signed)
Subjective:     Patient ID: Diana Schultz, female   DOB: 04/04/1966, 48 y.o.   MRN: [id number]  HPI Pt without complaints.  She has started exercising.  Good pain control   Review of Systems     Objective:   Physical Exam BP 140/78  Pulse 66  Temp(Src) 98 F (36.7 C) (Oral)  Resp 20  Ht 5' 3.5" (1.613 m)  Wt 284 lb 14.4 oz (129.23 kg)  BMI 49.67 kg/m2  LMP 12/01/2013 Abd: soft, NT, ND. Obese GU: EGBUS: no lesions Vagina: no blood in vault; cuff still healing.  Sl tender. No erythema or discharge Adnexa: no masses; non tender          Assessment:     6 weeks post op check- doing well.  Vaginal cuff still healing      Plan:     HCTZ 25 mg daily F/u 3 months or sooner prn Gradual increase of activity NOTHING per vagina for 2 additional weeks

## 2014-02-11 NOTE — Patient Instructions (Signed)

## 2014-03-04 ENCOUNTER — Ambulatory Visit: Payer: 59 | Admitting: Internal Medicine

## 2014-03-19 ENCOUNTER — Ambulatory Visit (INDEPENDENT_AMBULATORY_CARE_PROVIDER_SITE_OTHER): Payer: 59

## 2014-03-19 DIAGNOSIS — Z111 Encounter for screening for respiratory tuberculosis: Secondary | ICD-10-CM

## 2014-03-21 LAB — TB SKIN TEST
Induration: 0 mm
TB Skin Test: NEGATIVE

## 2014-04-09 ENCOUNTER — Ambulatory Visit: Payer: 59

## 2014-04-10 ENCOUNTER — Other Ambulatory Visit: Payer: Self-pay | Admitting: Obstetrics & Gynecology

## 2014-04-10 DIAGNOSIS — Z1231 Encounter for screening mammogram for malignant neoplasm of breast: Secondary | ICD-10-CM

## 2014-04-12 ENCOUNTER — Ambulatory Visit (INDEPENDENT_AMBULATORY_CARE_PROVIDER_SITE_OTHER): Payer: 59

## 2014-04-12 DIAGNOSIS — Z23 Encounter for immunization: Secondary | ICD-10-CM

## 2014-04-22 ENCOUNTER — Encounter: Payer: Self-pay | Admitting: Obstetrics & Gynecology

## 2014-05-06 ENCOUNTER — Telehealth: Payer: Self-pay | Admitting: *Deleted

## 2014-05-06 ENCOUNTER — Ambulatory Visit (HOSPITAL_COMMUNITY)
Admission: RE | Admit: 2014-05-06 | Discharge: 2014-05-06 | Disposition: A | Discharge status: Home / Self Care | Payer: 59 | Source: Ambulatory Visit | Attending: Obstetrics & Gynecology | Admitting: Obstetrics & Gynecology

## 2014-05-06 DIAGNOSIS — Z1231 Encounter for screening mammogram for malignant neoplasm of breast: Secondary | ICD-10-CM | POA: Insufficient documentation

## 2014-05-06 NOTE — Telephone Encounter (Signed)
Patient left message requesting that we respond to the refill request that [facility] has sent to [place] twice.

## 2014-05-06 NOTE — Telephone Encounter (Signed)
Called pt and left message on her personal voice mail. I stated that the nursing staff do not know what medication she is needing refilled. Any messages from her pharmacy regarding a refill request go directly to the prescriber's message center. Please call back and state the name of the medication she is requesting so that we can check with the provider.

## 2014-05-07 ENCOUNTER — Ambulatory Visit (HOSPITAL_COMMUNITY): Payer: 59

## 2014-05-07 MED ORDER — HYDROCHLOROTHIAZIDE 25 MG PO TABS: 25.0000 mg | ORAL_TABLET | Freq: Every day | ORAL | Status: DC

## 2014-05-07 NOTE — Addendum Note (Signed)
Addended by: Langston Reusing on: 05/07/2014 03:18 PM   Modules accepted: Orders

## 2014-05-07 NOTE — Telephone Encounter (Addendum)
Pt called back and spoke with [facility]- Museum/gallery curator.  She stated that she did not understand why we would not give her a refill of BP medication since Dr. Ihor Dow had told her to call back for refills. She also stated that she is planning to cancel her next appt and seek care elsewhere due to this issue. I was asked to speak with the pt. Upon review of medical record, I could not find in Dr. Vladimir Creeks notes that she instructed pt to call our office for refills. I did see that pt has appt scheduled w/PCP Dr. Larose Kells on 06/03/14.  I spoke w/Dr. Roselie Awkward (on-call) as Dr. Ihor Dow is not on duty this afternoon. He approved 1 refill. I called pt and advised her that I am sending 1 refill to her pharmacy.  It will be important for her to keep scheduled appt with Dr. Larose Kells on 12/14 so that he may determine further treatment of her hypertension. He will then need to prescribe her medication accordingly. Pt was also reminded of next clinic appt on 12/21.  Pt expressed gratitude and voiced understanding of all information and instructions given.

## 2014-05-07 NOTE — Telephone Encounter (Signed)
Pt called into nurse line and left a message that the medication is her blood pressure medication.  Contacted patient and informed her that we have not received any faxes from [facility] requesting blood pressure medication refills. Pt states that Dr. Ihor Dow told her to call her for blood pressure medication refills.  Informed patient that I would be glad to send request to Dr. Ihor Dow but that normally if they prescribe blood pressure medication they have you follow up with your primary care physician.  Pt states " to not worry about it, she will call her primary care physician".

## 2014-06-03 ENCOUNTER — Encounter: Payer: Self-pay | Admitting: Internal Medicine

## 2014-06-03 ENCOUNTER — Ambulatory Visit (INDEPENDENT_AMBULATORY_CARE_PROVIDER_SITE_OTHER): Payer: 59 | Admitting: Internal Medicine

## 2014-06-03 VITALS — BP 143/89 | HR 62 | Temp 97.5°F | Wt 284.2 lb

## 2014-06-03 DIAGNOSIS — I10 Essential (primary) hypertension: Secondary | ICD-10-CM

## 2014-06-03 DIAGNOSIS — R739 Hyperglycemia, unspecified: Secondary | ICD-10-CM

## 2014-06-03 DIAGNOSIS — Z23 Encounter for immunization: Secondary | ICD-10-CM

## 2014-06-03 MED ORDER — HYDROCHLOROTHIAZIDE 25 MG PO TABS: 25.0000 mg | ORAL_TABLET | Freq: Every day | ORAL | Status: DC

## 2014-06-03 NOTE — Patient Instructions (Signed)
Get your blood work before you leave   Please come back to the office in 3-4 months  for a routine check up  Come back fasting

## 2014-06-03 NOTE — Assessment & Plan Note (Signed)
Her gynecologist noted elevated BP in October, they Rx  HCTZ, BP today 143/89. Plan: Check a BMP, continue HCTZ, extensive discussion about diet and exercise

## 2014-06-03 NOTE — Progress Notes (Signed)
Pre visit review using our clinic review tool, if applicable. No additional management support is needed unless otherwise documented below in the visit note. 

## 2014-06-03 NOTE — Progress Notes (Signed)
Subjective:    Patient ID: Diana Schultz, female    DOB: [birth year], 48 y.o.   MRN: [id number]  DOS:  06/03/2014 Type of visit - description : f/u Interval history: Since the last time she was here, had a hysterectomy and is doing great. Also had a colonoscopy and told it was okay. Hyperglycemia, concern about this issue, has not improved her lifestyle Her gynecologist noted that elevated blood pressure in the 160s, was a started on HCTZ, has been taking it regularly, ambulatory BPs are now in the 130s, 140s.   ROS + stress, works long hours  Past Medical History  Diagnosis Date  . Ovarian cyst   . Umbilical hernia   . Hypertension     history,lost weight, controlled with diet and exercise, no current med  . DVT (deep venous thrombosis)     Hx at age 61 blood clot in right leg, d/c birth control pills and no other problems  . Depression   . Diverticulitis   . Insulin resistance   . Diabetes mellitus without complication     borderline - no meds  . Vaginal delivery 1984, 1989    Past Surgical History  Procedure Laterality Date  . Tubal ligation    . Cholecystectomy    . Appendectomy    . Leep    . Dilation and curettage of uterus    . Dilitation & currettage/hystroscopy with novasure ablation N/A 03/23/2013    Procedure: DILATATION & CURETTAGE/HYSTEROSCOPY WITH NOVASURE ABLATION;  Surgeon: Lavonia Drafts, MD;  Location: [facility];  Service: Gynecology;  Laterality: N/A;  . Dilation and curettage of uterus N/A 11/01/2013    Procedure: DILATATION AND CURETTAGE;  Surgeon: Mora Bellman, MD;  Location: [facility];  Service: Gynecology;  Laterality: N/A;  . Ablation  03/23/13    [place]  . Colonoscopy  11/2013    [place]  . Vaginal hysterectomy N/A 12/31/2013    Procedure: HYSTERECTOMY VAGINAL;  Surgeon: Lavonia Drafts, MD;  Location: [facility];  Service: Gynecology;  Laterality: N/A;    History   Social History  . Marital Status: Married    Spouse Name: N/A    Number  of Children: 2  . Years of Education: N/A   Occupational History  . CNA, med tech  by training, does prn works    Social History Main Topics  . Smoking status: Never Smoker   . Smokeless tobacco: Never Used  . Alcohol Use: No     Comment: rare   . Drug Use: No  . Sexual Activity: No   Other Topics Concern  . Not on file   Social History Narrative   Original from [place]        Medication List       This list is accurate as of: 06/03/14  6:20 PM.  Always use your most recent med list.               hydrochlorothiazide 25 MG tablet  Commonly known as:  HYDRODIURIL  Take 1 tablet (25 mg total) by mouth daily.          Objective:   Physical Exam BP 143/89 mmHg  Pulse 62  Temp(Src) 97.5 F (36.4 C) (Oral)  Wt 284 lb 4 oz (128.935 kg)  SpO2 96%  LMP 12/01/2013 General -- alert, well-developed, NAD.   Lungs -- normal respiratory effort, no intercostal retractions, no accessory muscle use, and normal breath sounds.  Heart-- normal rate, regular rhythm, no murmur.  Extremities-- no pretibial edema bilaterally  Neurologic--  alert & oriented X3. Speech normal, gait appropriate for age, strength symmetric and appropriate for age.   Psych-- Cognition and judgment appear intact. Cooperative with normal attention span and concentration. No anxious or depressed appearing.      Assessment & Plan:  Mild  anemia, Check CBC

## 2014-06-03 NOTE — Assessment & Plan Note (Signed)
Still has to work on diet and exercise, extensive discussion. Check labs

## 2014-06-04 LAB — CBC WITH DIFFERENTIAL/PLATELET
BASOS PCT: 0.1 % (ref 0.0–3.0)
Basophils Absolute: 0 10*3/uL (ref 0.0–0.1)
EOS PCT: 1.7 % (ref 0.0–5.0)
Eosinophils Absolute: 0.2 10*3/uL (ref 0.0–0.7)
HCT: 38.2 % (ref 36.0–46.0)
Hemoglobin: 12.4 g/dL (ref 12.0–15.0)
LYMPHS ABS: 2.6 10*3/uL (ref 0.7–4.0)
Lymphocytes Relative: 22.4 % (ref 12.0–46.0)
MCHC: 32.5 g/dL (ref 30.0–36.0)
MCV: 75.6 fl — AB (ref 78.0–100.0)
MONO ABS: 0.2 10*3/uL (ref 0.1–1.0)
Monocytes Relative: 1.8 % — ABNORMAL LOW (ref 3.0–12.0)
Neutro Abs: 8.7 10*3/uL — ABNORMAL HIGH (ref 1.4–7.7)
Neutrophils Relative %: 74 % (ref 43.0–77.0)
Platelets: 226 10*3/uL (ref 150.0–400.0)
RBC: 5.05 Mil/uL (ref 3.87–5.11)
RDW: 16.5 % — ABNORMAL HIGH (ref 11.5–15.5)
WBC: 11.7 10*3/uL — AB (ref 4.0–10.5)

## 2014-06-04 LAB — BASIC METABOLIC PANEL
BUN: 15 mg/dL (ref 6–23)
CALCIUM: 9.4 mg/dL (ref 8.4–10.5)
CO2: 24 mEq/L (ref 19–32)
Chloride: 105 mEq/L (ref 96–112)
Creatinine, Ser: 0.8 mg/dL (ref 0.4–1.2)
GFR: 100.18 mL/min (ref >60.00)
Glucose, Bld: 99 mg/dL (ref 70–99)
Potassium: 3.5 mEq/L (ref 3.5–5.1)
Sodium: 138 mEq/L (ref 135–145)

## 2014-06-04 LAB — HEMOGLOBIN A1C: Hgb A1c MFr Bld: 6.3 % (ref 4.6–6.5)

## 2014-06-05 ENCOUNTER — Telehealth: Payer: Self-pay | Admitting: Internal Medicine

## 2014-06-05 NOTE — Telephone Encounter (Signed)
Advise patient, blood sugar test A1c is stable at 6.3, white blood cells are slightly elevated, recheck in few months; other labs  normal

## 2014-06-05 NOTE — Telephone Encounter (Signed)
Please advise 

## 2014-06-05 NOTE — Telephone Encounter (Signed)
Caller name: Shadasia  Relation to pt: self Call back number: [phone] Pharmacy:  Reason for call:   Requesting last lab results

## 2014-06-06 NOTE — Telephone Encounter (Signed)
Spoke with Pt, informed her of lab results. Informed her of elevated WBC, but informed her we would recheck at her next visit in April. Pt verbalized understanding.

## 2014-06-10 ENCOUNTER — Ambulatory Visit (INDEPENDENT_AMBULATORY_CARE_PROVIDER_SITE_OTHER): Payer: 59 | Admitting: Obstetrics & Gynecology

## 2014-06-10 ENCOUNTER — Encounter: Payer: Self-pay | Admitting: Obstetrics & Gynecology

## 2014-06-10 VITALS — BP 145/76 | HR 53 | Temp 97.7°F | Wt 280.8 lb

## 2014-06-10 DIAGNOSIS — R102 Pelvic and perineal pain: Secondary | ICD-10-CM

## 2014-06-10 DIAGNOSIS — D72829 Elevated white blood cell count, unspecified: Secondary | ICD-10-CM

## 2014-06-10 NOTE — Patient Instructions (Signed)
Pelvic Pain Female pelvic pain can be caused by many different things and start from a variety of places. Pelvic pain refers to pain that is located in the lower half of the abdomen and between your hips. The pain may occur over a short period of time (acute) or may be reoccurring (chronic). The cause of pelvic pain may be related to disorders affecting the female reproductive organs (gynecologic), but it may also be related to the bladder, kidney stones, an intestinal complication, or muscle or skeletal problems. Getting help right away for pelvic pain is important, especially if there has been severe, sharp, or a sudden onset of unusual pain. It is also important to get help right away because some types of pelvic pain can be life threatening.  CAUSES  Below are only some of the causes of pelvic pain. The causes of pelvic pain can be in one of several categories.   Gynecologic.  Pelvic inflammatory disease.  Sexually transmitted infection.  Ovarian cyst or a twisted ovarian ligament (ovarian torsion).  Uterine lining that grows outside the uterus (endometriosis).  Fibroids, cysts, or tumors.  Ovulation.  Pregnancy.  Pregnancy that occurs outside the uterus (ectopic pregnancy).  Miscarriage.  Labor.  Abruption of the placenta or ruptured uterus.  Infection.  Uterine infection (endometritis).  Bladder infection.  Diverticulitis.  Miscarriage related to a uterine infection (septic abortion).  Bladder.  Inflammation of the bladder (cystitis).  Kidney stone(s).  Gastrointestinal.  Constipation.  Diverticulitis.  Neurologic.  Trauma.  Feeling pelvic pain because of mental or emotional causes (psychosomatic).  Cancers of the bowel or pelvis. EVALUATION  Your caregiver will want to take a careful history of your concerns. This includes recent changes in your health, a careful gynecologic history of your periods (menses), and a sexual history. Obtaining your family  history and medical history is also important. Your caregiver may suggest a pelvic exam. A pelvic exam will help identify the location and severity of the pain. It also helps in the evaluation of which organ system may be involved. In order to identify the cause of the pelvic pain and be properly treated, your caregiver may order tests. These tests may include:   A pregnancy test.  Pelvic ultrasonography.  An X-ray exam of the abdomen.  A urinalysis or evaluation of vaginal discharge.  Blood tests. HOME CARE INSTRUCTIONS   Only take over-the-counter or prescription medicines for pain, discomfort, or fever as directed by your caregiver.   Rest as directed by your caregiver.   Eat a balanced diet.   Drink enough fluids to make your urine clear or pale yellow, or as directed.   Avoid sexual intercourse if it causes pain.   Apply warm or cold compresses to the lower abdomen depending on which one helps the pain.   Avoid stressful situations.   Keep a journal of your pelvic pain. Write down when it started, where the pain is located, and if there are things that seem to be associated with the pain, such as food or your menstrual cycle.  Follow up with your caregiver as directed.  SEEK MEDICAL CARE IF:  Your medicine does not help your pain.  You have abnormal vaginal discharge. SEEK IMMEDIATE MEDICAL CARE IF:   You have heavy bleeding from the vagina.   Your pelvic pain increases.   You feel light-headed or faint.   You have chills.   You have pain with urination or blood in your urine.   You have uncontrolled diarrhea   or vomiting.   You have a fever or persistent symptoms for more than 3 days.  You have a fever and your symptoms suddenly get worse.   You are being physically or sexually abused.  MAKE SURE YOU:  Understand these instructions.  Will watch your condition.  Will get help if you are not doing well or get worse. Document Released:  05/04/2004 Document Revised: 10/22/2013 Document Reviewed: 09/27/2011 Exit[employer] Patient Information 2015 [employer]. This information is not intended to replace advice given to you by your health care provider. Make sure you discuss any questions you have with your health care provider.  

## 2014-06-10 NOTE — Progress Notes (Signed)
Subjective:     Patient ID: Diana Schultz, female   DOB: 1965-07-04, 48 y.o.   MRN: [id number]  HPI Pt is 5 months post op from a TVH. She had surgery for pelvic pain thought due to a hematocolpos.  She reports that she continues to have pelvic pain.  She also reports pain with intercourse.  She reports that she takes Tylenol twice a day for the pain.      Review of Systems     Objective:   Physical Exam BP 145/76 mmHg  Pulse 53  Temp(Src) 97.7 F (36.5 C) (Oral)  Wt 280 lb 12.8 oz (127.37 kg)  LMP 12/01/2013 GU: EGBUS: no lesions Vagina: no blood in vault Cervix/Uterus: small, mobile Adnexa: no masses; sl tender         Assessment:     Pelvic pain post TVH.  Pt had pain prior to surgery.  If sono is normal need to look for other causes of pelvic pain.       Plan:     Pelvic sono F/u in 3 months or sooner prn

## 2014-06-18 ENCOUNTER — Ambulatory Visit (HOSPITAL_COMMUNITY)
Admission: RE | Admit: 2014-06-18 | Discharge: 2014-06-18 | Disposition: A | Discharge status: Home / Self Care | Payer: 59 | Source: Ambulatory Visit | Attending: Obstetrics & Gynecology | Admitting: Obstetrics & Gynecology

## 2014-06-18 ENCOUNTER — Other Ambulatory Visit: Payer: Self-pay | Admitting: General Practice

## 2014-06-18 DIAGNOSIS — Q505 Embryonic cyst of broad ligament: Secondary | ICD-10-CM | POA: Diagnosis not present

## 2014-06-18 DIAGNOSIS — Z90710 Acquired absence of both cervix and uterus: Secondary | ICD-10-CM | POA: Insufficient documentation

## 2014-06-18 DIAGNOSIS — R102 Pelvic and perineal pain: Secondary | ICD-10-CM | POA: Insufficient documentation

## 2014-06-19 ENCOUNTER — Telehealth: Payer: Self-pay | Admitting: *Deleted

## 2014-06-19 NOTE — Telephone Encounter (Signed)
Diana Schultz left a message requesting results of ultrasound done yesterday

## 2014-06-20 NOTE — Telephone Encounter (Signed)
Called pt and informed pt that her [place] resulted in a cyst on her left ovary.  Pt stated "well,what I do know"?  I advised pt that the provider has reviewed her [place] but has not sent recommendations as far as management.  I informed pt that as soon as we here back from the provider about management that we will give her a call.  But I informed her that it may not be today and that our office will be closed tomorrow for the holidays.  Pt agreed.

## 2014-06-26 ENCOUNTER — Telehealth: Payer: Self-pay | Admitting: *Deleted

## 2014-06-26 NOTE — Telephone Encounter (Signed)
Diana Schultz called and left another message that she called last week for results of her sonogram and that nurse was supposed to call and her know what the doctor wanted her to do next about her cyst.

## 2014-06-26 NOTE — Telephone Encounter (Signed)
Called pt back and informed her that Dr. Ihor Dow has reviewed her [place] result. It showed a simple ovarian cyst which is normal. These types of cysts come and go during a menstrual cycle. Even though she has had a hysterectomy, she still has hormonal changes which cause these types of cysts. This may be the cause of her pain. Dr. Ihor Dow will evaluate further at her next visit. She will be called with an appt once the March schedule is available. Meanwhile, pt may take OTC pain relievers such as Ibuprofen, Aleve or Tylenol.  Pt voiced understanding.

## 2014-07-22 ENCOUNTER — Encounter: Payer: Self-pay | Admitting: Obstetrics & Gynecology

## 2014-07-25 ENCOUNTER — Telehealth: Payer: Self-pay | Admitting: *Deleted

## 2014-07-25 NOTE — Telephone Encounter (Signed)
Pt called clinic earlier today and spoke with [facility] regarding a letter she had received. Pt did not understand the reason for the letter and also was upset that she had been previously called with results information by a nurse instead of Dr. Ihor Dow. I then spoke with pt and explained that it is customary in this office for nurses to call pt's with non-urgent or non-worrisome results information. We do so at the doctor's request. Pt stated  That she had been told by Dr. Ihor Dow that she would be the one to call her. I offered to have Dr. Ihor Dow call her and I assured her that it is not a problem if she would like to speak with her. Pt declined the offer and stated that she did not understand the reason why she needs a follow up appt if her ovarian cyst is normal. I explained that the appt is given in order to see if her pain or problem has resolved or is still on-going so that we may explain her treatment options and provide her with the best care. I also stated that if she does not have any pain or does not wish to be seen, she may cancel the appt. Pt stated that she does not have pain at this time and does not feel the need for follow up appt. I stated that I will cancel the appt for her and she may call back if she has future questions or if she needs appt. Pt agreed and voiced understanding.

## 2014-08-26 ENCOUNTER — Ambulatory Visit: Payer: 59 | Admitting: Obstetrics & Gynecology

## 2014-10-03 ENCOUNTER — Encounter: Payer: Self-pay | Admitting: Internal Medicine

## 2014-10-03 ENCOUNTER — Ambulatory Visit (INDEPENDENT_AMBULATORY_CARE_PROVIDER_SITE_OTHER): Payer: Commercial Managed Care - PPO | Admitting: Internal Medicine

## 2014-10-03 VITALS — BP 132/78 | HR 59 | Temp 98.0°F | Ht 64.0 in | Wt 281.2 lb

## 2014-10-03 DIAGNOSIS — R350 Frequency of micturition: Secondary | ICD-10-CM | POA: Diagnosis not present

## 2014-10-03 DIAGNOSIS — M79606 Pain in leg, unspecified: Secondary | ICD-10-CM | POA: Diagnosis not present

## 2014-10-03 DIAGNOSIS — R739 Hyperglycemia, unspecified: Secondary | ICD-10-CM | POA: Diagnosis not present

## 2014-10-03 DIAGNOSIS — I10 Essential (primary) hypertension: Secondary | ICD-10-CM | POA: Diagnosis not present

## 2014-10-03 LAB — URINALYSIS, ROUTINE W REFLEX MICROSCOPIC
Bilirubin Urine: NEGATIVE
HGB URINE DIPSTICK: NEGATIVE
Ketones, ur: NEGATIVE
Leukocytes, UA: NEGATIVE
NITRITE: NEGATIVE
PH: 5.5 (ref 5.0–8.0)
RBC / HPF: NONE SEEN (ref 0–?)
Specific Gravity, Urine: >=1.03 — AB (ref 1.000–1.030)
TOTAL PROTEIN, URINE-UPE24: NEGATIVE
URINE GLUCOSE: NEGATIVE
Urobilinogen, UA: 0.2 (ref 0.0–1.0)

## 2014-10-03 LAB — HEMOGLOBIN A1C: Hgb A1c MFr Bld: 6 % (ref 4.6–6.5)

## 2014-10-03 MED ORDER — CYCLOBENZAPRINE HCL 5 MG PO TABS: 5.0000 mg | ORAL_TABLET | Freq: Every day | ORAL | Status: DC

## 2014-10-03 NOTE — Patient Instructions (Signed)
Get your blood work before you leave   Continue checking your blood pressure  Take cyclobenzaprine one or 2 tablets at bedtime to see if it helps with leg pain  Come back to the office in 4 months  for a physical exam  Please schedule an appointment at the front desk    Come back fasting

## 2014-10-03 NOTE — Assessment & Plan Note (Addendum)
Doing better with diet and exercise although has not lost much weight. Reports urinary frequency but is probably related to the fact that she drinks plenty of fluids. Plan: Check A1c, continue better diet and exercise, check a urinalysis

## 2014-10-03 NOTE — Assessment & Plan Note (Signed)
Self discontinue HCTZ as she was doing better with her diet and exercise, ambulatory BPs great

## 2014-10-03 NOTE — Progress Notes (Signed)
Subjective:    Patient ID: Diana Schultz, female    DOB: [birth year], 49 y.o.   MRN: [id number]  DOS:  10/03/2014 Type of visit - description : rov Interval history: Hypertension, self discontinued medication as she was trying to improve diet - exercise and  Drinking plenty of fluids. Ambulatory BPs in the 120 over 70s.  Hyperglycemia, patient concerned about it, likes blood work.  Also complains of bilateral, whole leg pain at night, not necessarily described as a cramp. Has not trying to shake the legs or walk it off. Denies claudication Does have occasional back pain but is not severe.   Review of Systems No chest pain or difficulty breathing No nausea, vomiting, diarrhea. Admits to urinary frequency but no dysuria, gross hematuria difficulty urinating. She thinks is related to the fact that she is forcing herself to drink plenty of fluids. Has some degree of bladder incontinence which is chronic -w/ cough- but no bowel incontinence   Past Medical History  Diagnosis Date  . Ovarian cyst   . Umbilical hernia   . Hypertension     history,lost weight, controlled with diet and exercise, no current med  . DVT (deep venous thrombosis)     Hx at age 57 blood clot in right leg, d/c birth control pills and no other problems  . Depression   . Diverticulitis   . Insulin resistance   . Diabetes mellitus without complication     borderline - no meds  . Vaginal delivery 1984, 1989    Past Surgical History  Procedure Laterality Date  . Tubal ligation    . Cholecystectomy    . Appendectomy    . Leep    . Dilation and curettage of uterus    . Dilitation & currettage/hystroscopy with novasure ablation N/A 03/23/2013    Procedure: DILATATION & CURETTAGE/HYSTEROSCOPY WITH NOVASURE ABLATION;  Surgeon: Lavonia Drafts, MD;  Location: [facility];  Service: Gynecology;  Laterality: N/A;  . Dilation and curettage of uterus N/A 11/01/2013    Procedure: DILATATION AND CURETTAGE;  Surgeon:  Mora Bellman, MD;  Location: [facility];  Service: Gynecology;  Laterality: N/A;  . Ablation  03/23/13    [place]  . Colonoscopy  11/2013    [place]  . Vaginal hysterectomy N/A 12/31/2013    Procedure: HYSTERECTOMY VAGINAL;  Surgeon: Lavonia Drafts, MD;  Location: [facility];  Service: Gynecology;  Laterality: N/A;    History   Social History  . Marital Status: Married    Spouse Name: N/A  . Number of Children: 2  . Years of Education: N/A   Occupational History  . CNA, med tech  by training, does prn works    Social History Main Topics  . Smoking status: Never Smoker   . Smokeless tobacco: Never Used  . Alcohol Use: No     Comment: rare   . Drug Use: No  . Sexual Activity: No   Other Topics Concern  . Not on file   Social History Narrative   Original from [place]        Medication List       This list is accurate as of: 10/03/14  6:16 PM.  Always use your most recent med list.               cyclobenzaprine 5 MG tablet  Commonly known as:  FLEXERIL  Take 1-2 tablets (5-10 mg total) by mouth at bedtime.           Objective:  Physical Exam BP 132/78 mmHg  Pulse 59  Temp(Src) 98 F (36.7 C) (Oral)  Ht 5\' 4"  (1.626 m)  Wt 281 lb 4 oz (127.574 kg)  BMI 48.25 kg/m2  SpO2 97%  LMP 12/01/2013 General:   Well developed, well nourished . NAD.  HEENT:  Normocephalic . Face symmetric, atraumatic Skin: Not pale. Not jaundice Foot exam: Normal to inspection and palpation, good pedal pulses, pinprick examination normal Neurologic:  alert & oriented X3.  Speech normal, gait appropriate for age and unassisted DTRs symmetric  straight leg test negative Psych--  Cognition and judgment appear intact.  Cooperative with normal attention span and concentration.  Behavior appropriate. No anxious or depressed appearing.       Assessment & Plan:    Leg pain, Nocturnal leg pain of unclear etiology, vascular-neuro exam normal. Atypical RLS? Others? Plan: Trial with  Flexeril

## 2014-10-03 NOTE — Progress Notes (Signed)
Pre visit review using our clinic review tool, if applicable. No additional management support is needed unless otherwise documented below in the visit note. 

## 2015-01-08 IMAGING — CT CT ABD-PELV W/O CM
1 series · 15 of 25 positions shown, 19 images · non-contrast
Comparison: 01/25/2012, 12/18/2010

CLINICAL DATA: Pelvic pain and pressure, vaginal bleeding, central
pelvic and lower abdominal pain which began on 02/21/2013

CT ABDOMEN AND PELVIS WITHOUT CONTRAST
TECHNIQUE: Multidetector CT imaging of the abdomen and pelvis was
performed following the standard protocol without intravenous
contrast. Sagittal and coronal MPR images reconstructed from axial
data set.

[Series 4: lung · axial · 0.87mm/px · z∈[+116,+226]mm · 15 of 25 slices shown, 19 images]
[im 2/25  soft-tissue]
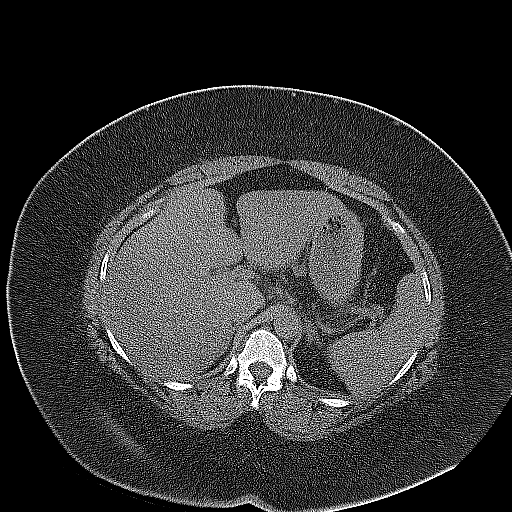
[im 2/25  bone]
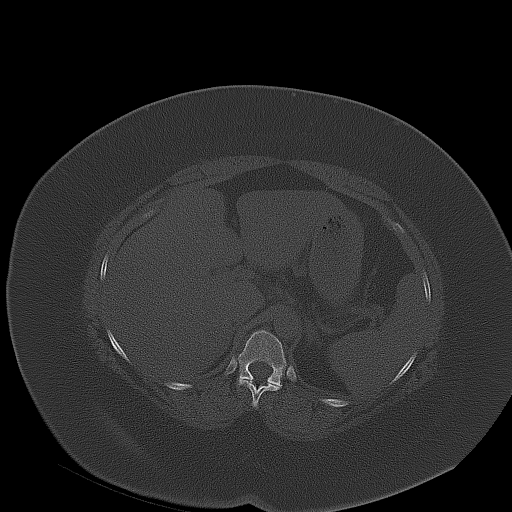
[im 4/25  soft-tissue]
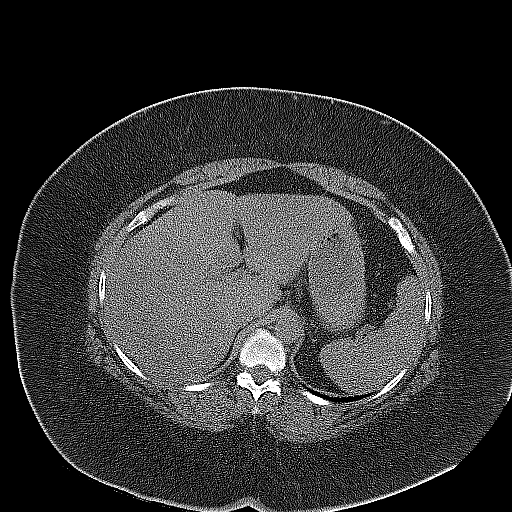
[im 6/25  soft-tissue]
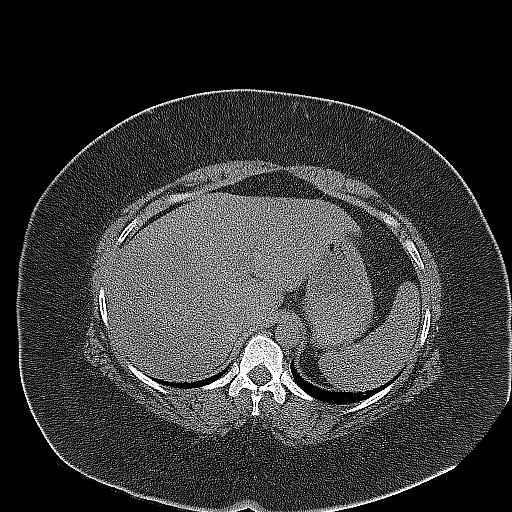
[im 8/25  soft-tissue]
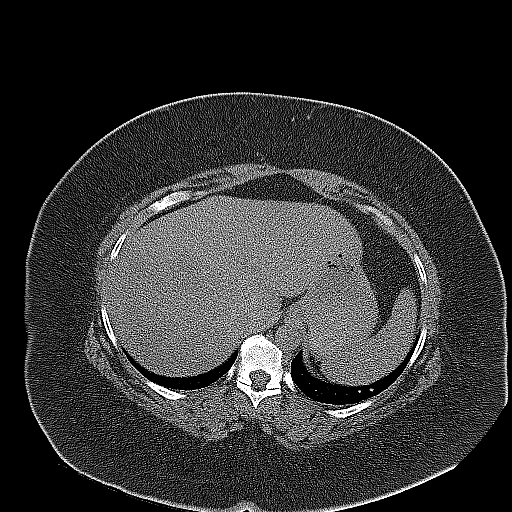
[im 9/25  soft-tissue]
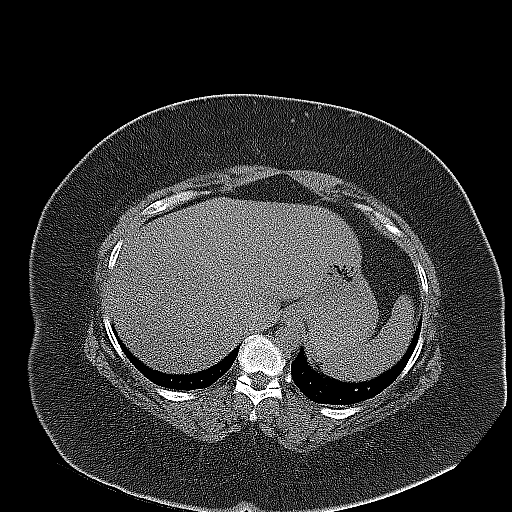
[im 11/25  soft-tissue]
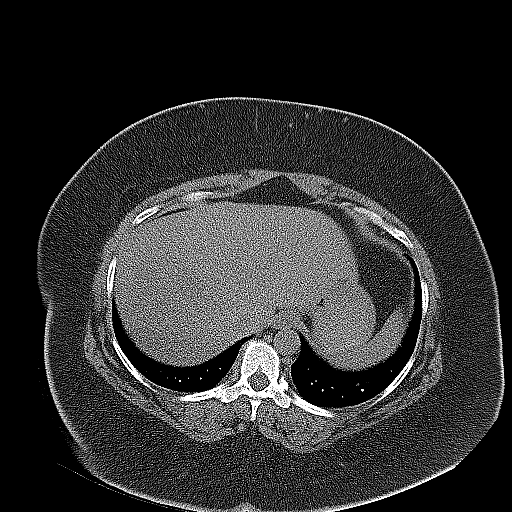
[im 13/25  soft-tissue]
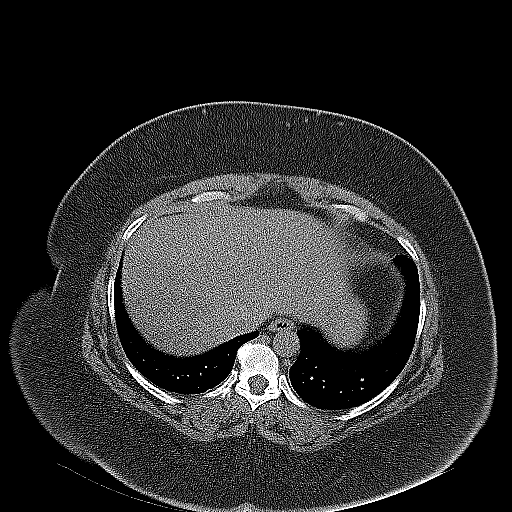
[im 15/25  soft-tissue]
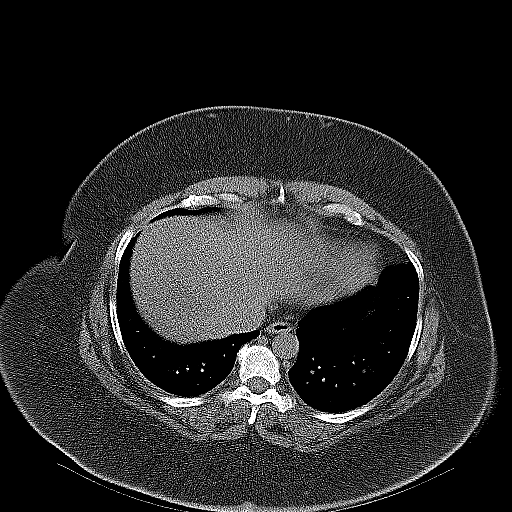
[im 17/25  soft-tissue]
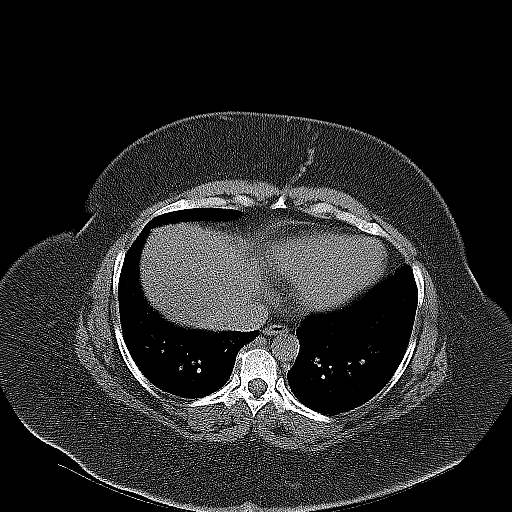
[im 17/25  bone]
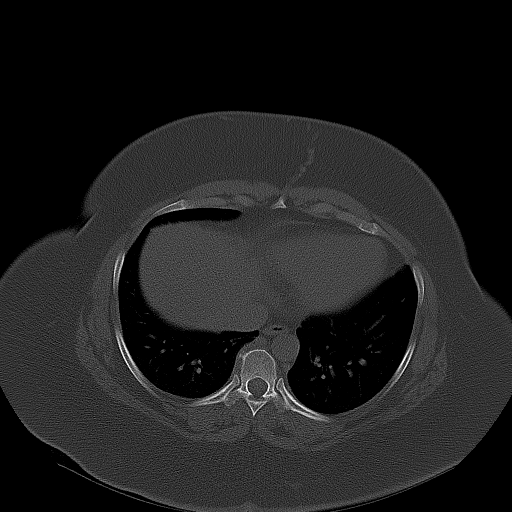
[im 18/25  soft-tissue]
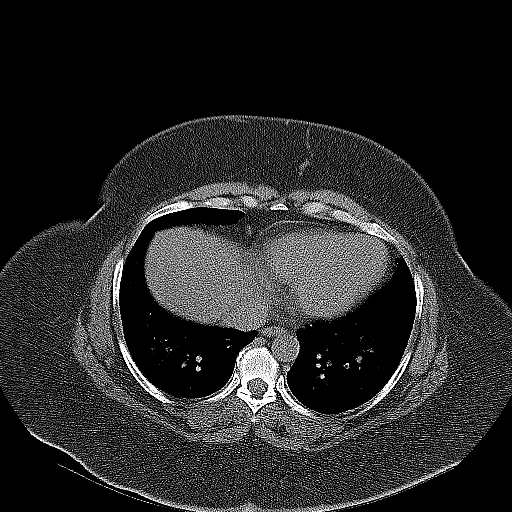
[im 20/25  soft-tissue]
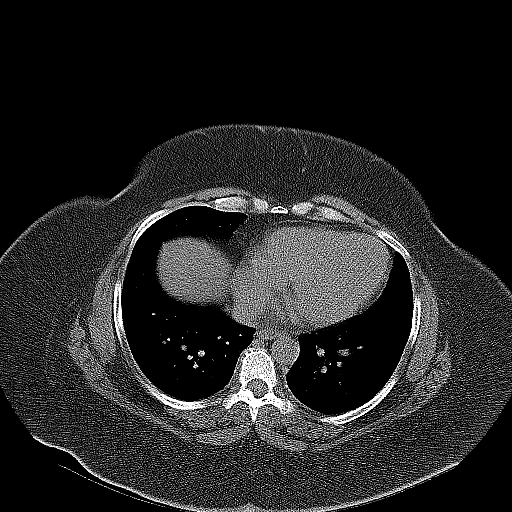
[im 21/25  lung]
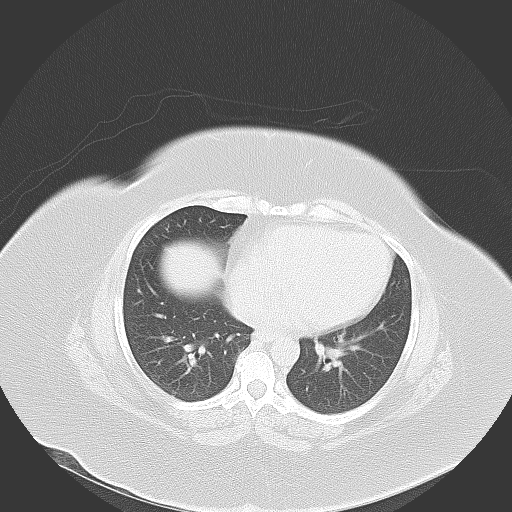
[im 22/25  soft-tissue]
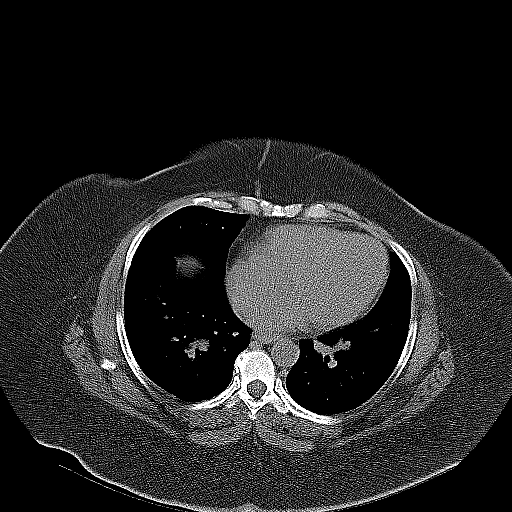
[im 22/25  lung]
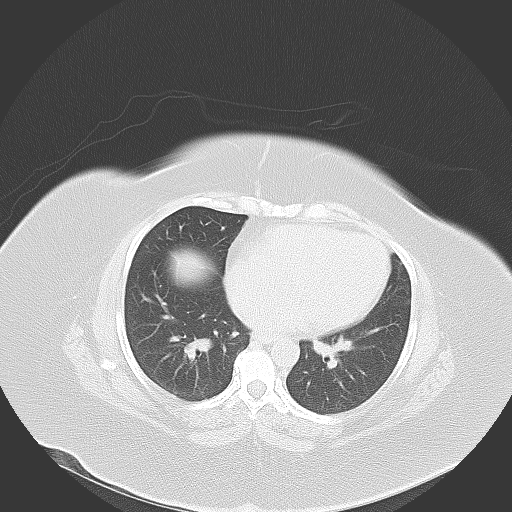
[im 23/25  lung]
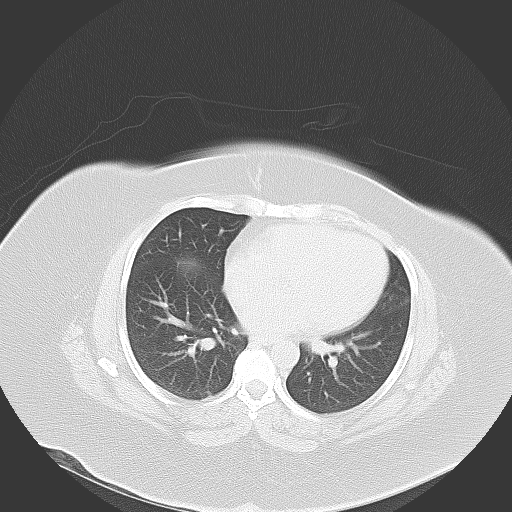
[im 24/25  soft-tissue]
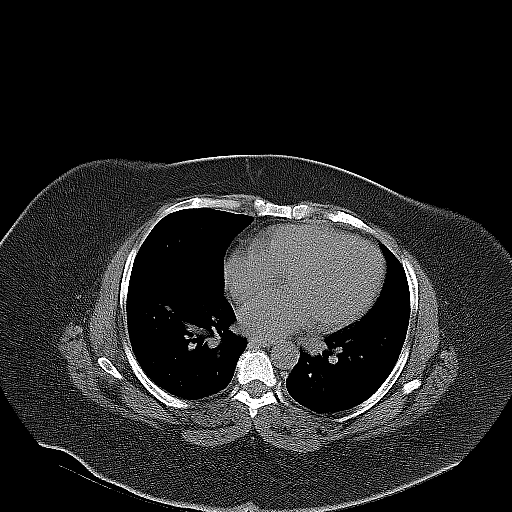
[im 24/25  lung]
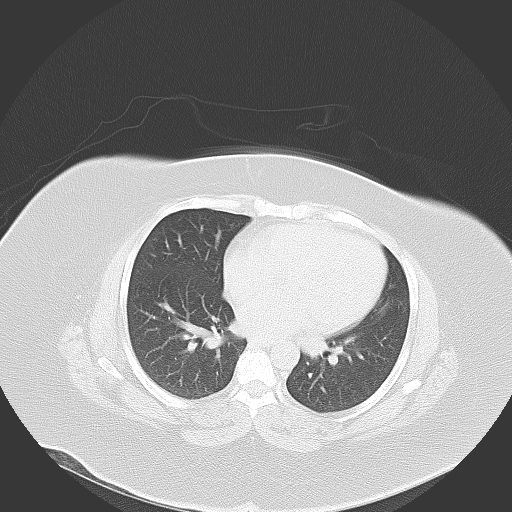

[15 of 25 positions shown; findings below may reference images not displayed]

FINDINGS: Small nodular foci at posterior left lower lobe sulcus image 19
unchanged since 3383.
Gallbladder and appendix surgically absent.
Scattered fatty infiltration of liver, inhomogeneous.
Within limits of a nonenhanced exam, liver, spleen, pancreas,
kidneys, and adrenal glands otherwise normal appearance.
Unremarkable ureters, bladder, and right ovary.

Question left ovarian cyst 2.8 x 2.5 cm.
Probable Nabothian cyst at cervix.
Minimal sigmoid diverticulosis without diverticulitis changes.
Stomach and bowel loops otherwise normal appearance.
Small umbilical hernia containing fat.
No mass, adenopathy, free fluid, or inflammatory process.
No acute osseous findings.
IMPRESSION: Inhomogeneous fatty infiltration of liver.
Small left ovarian cyst 2.8 x 2.5 cm.
Small umbilical hernia containing fat.
Otherwise negative exam.

## 2015-01-19 IMAGING — US US TRANSVAGINAL NON-OB
2 series · 13 of 25 positions shown · non-contrast
Comparison: 02/25/2013 pelvic ultrasound

CLINICAL DATA: Severe increasing pelvic pain. History of cyst, rule
out torsion.

EXAM:
TRANSVAGINAL ULTRASOUND OF PELVIS
DOPPLER ULTRASOUND OF OVARIES
TECHNIQUE: Transvaginal ultrasound examination of the pelvis was performed
including evaluation of the uterus, ovaries, adnexal regions, and
pelvic cul-de-sac.
Color and duplex Doppler ultrasound was utilized to evaluate blood
flow to the ovaries.

[Series 1: us transvaginal non-ob · 1 of 4 slices shown (1 of 2)]
[im 1/4]
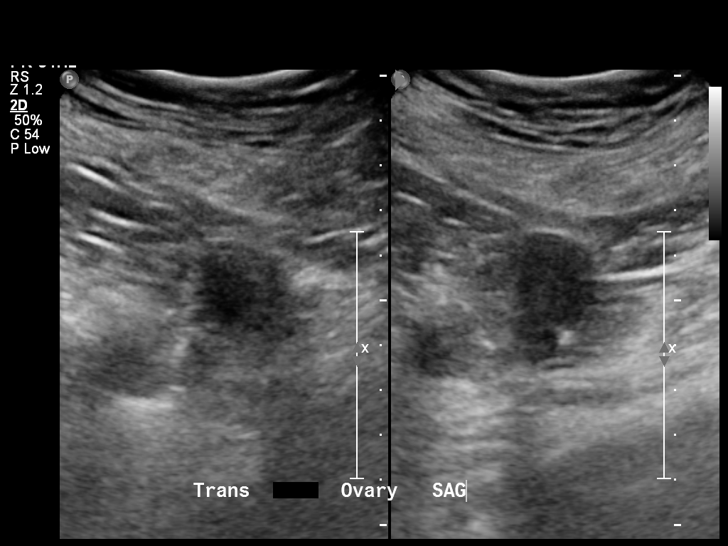

[Series 1: us transvaginal non-ob · 64 acquisitions, 12 frames shown (2 of 2)]
[im 3/64]
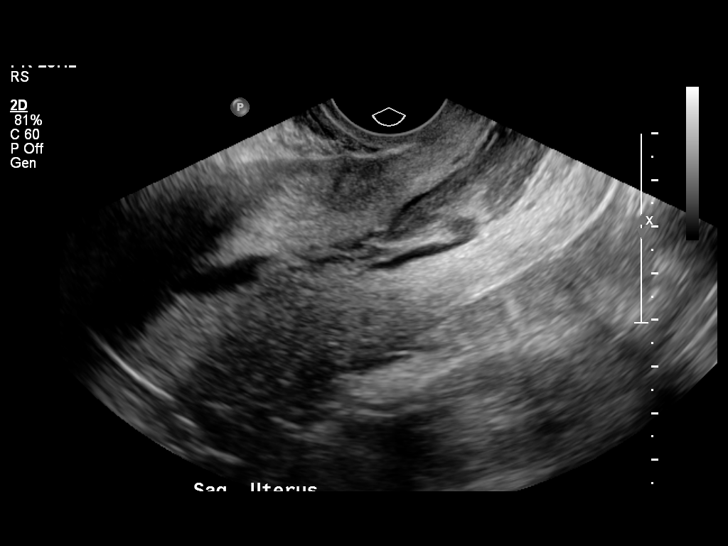
[im 9/64]
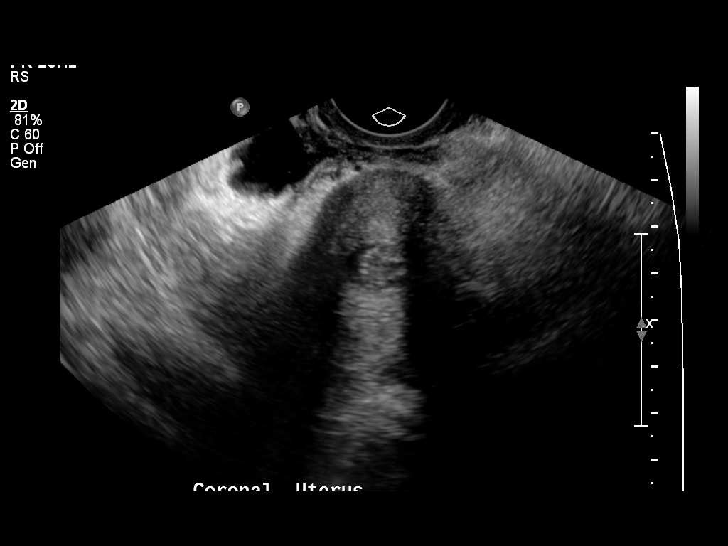
[im 14/64]
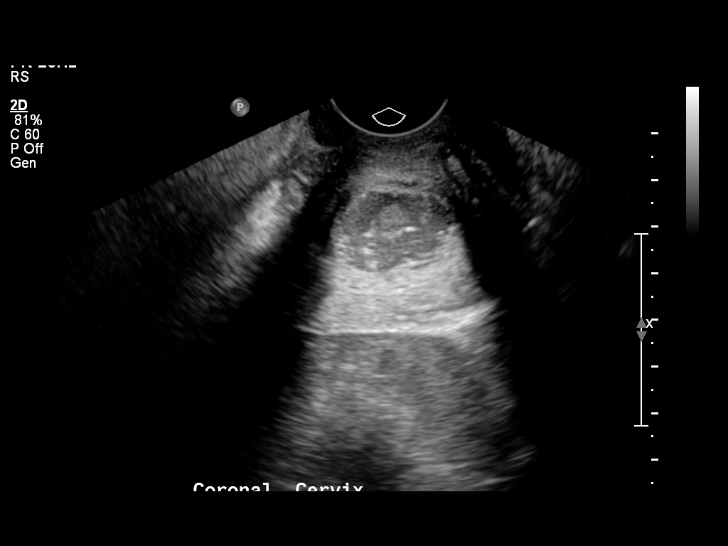
[im 20/64]
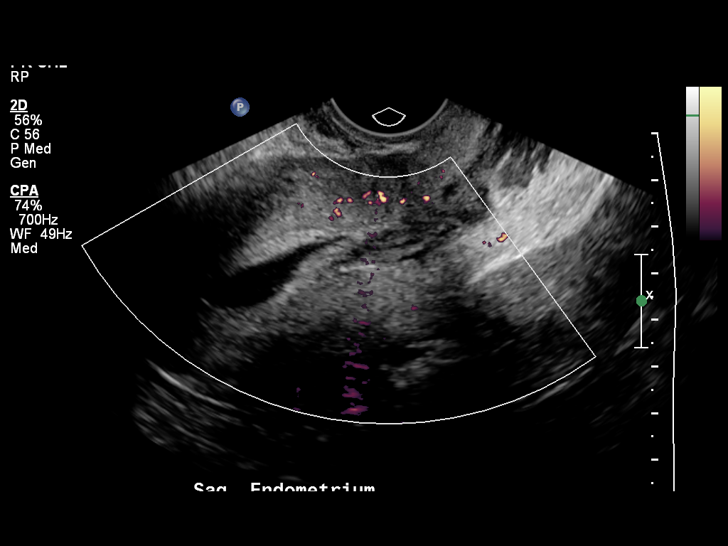
[im 25/64]
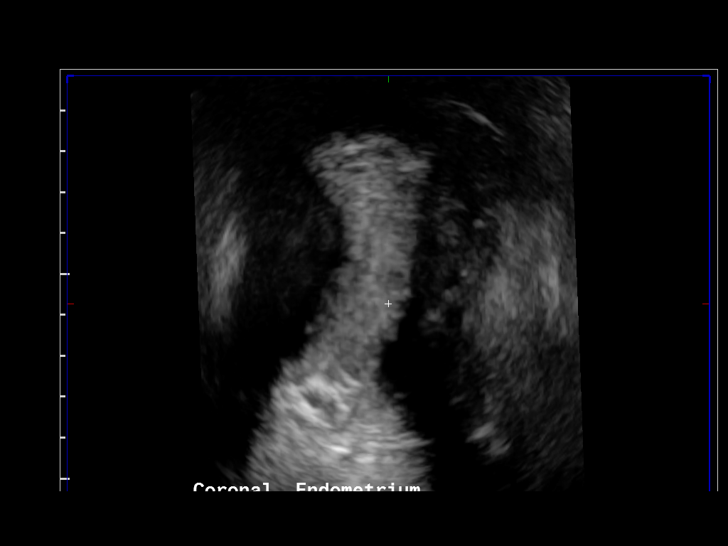
[im 31/64]
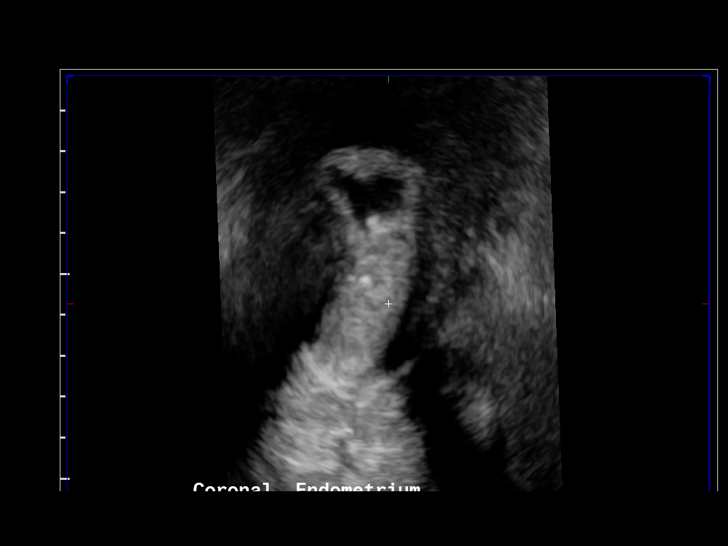
[im 36/64]
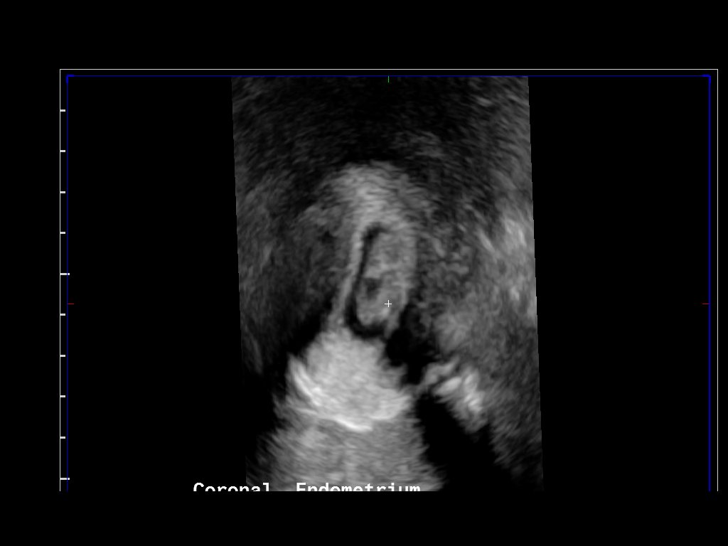
[im 42/64]
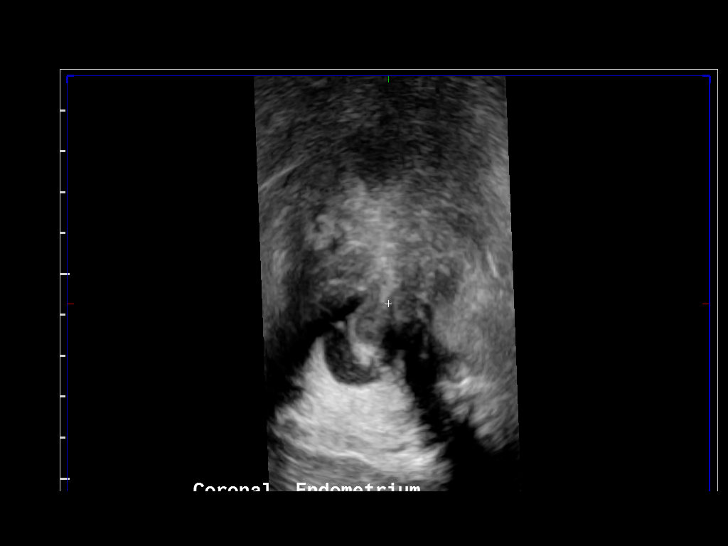
[im 47/64]
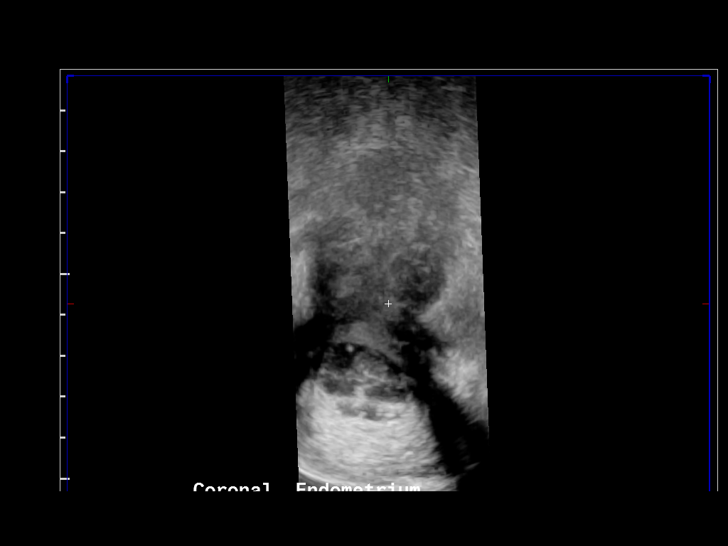
[im 53/64]
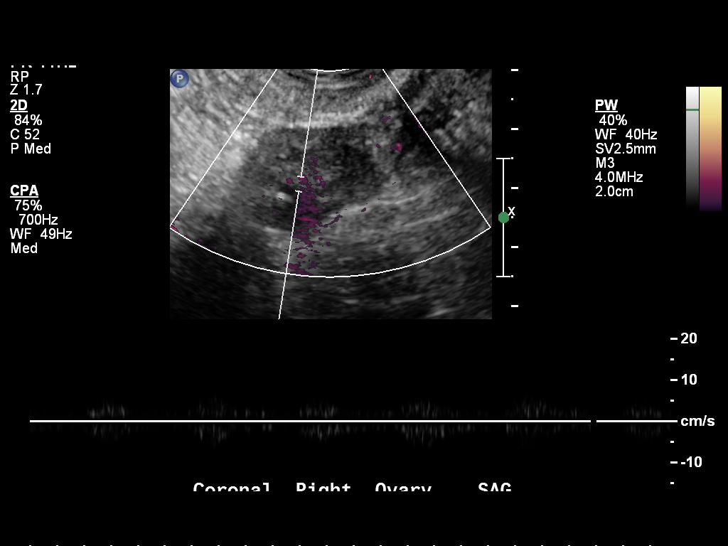
[im 58/64]
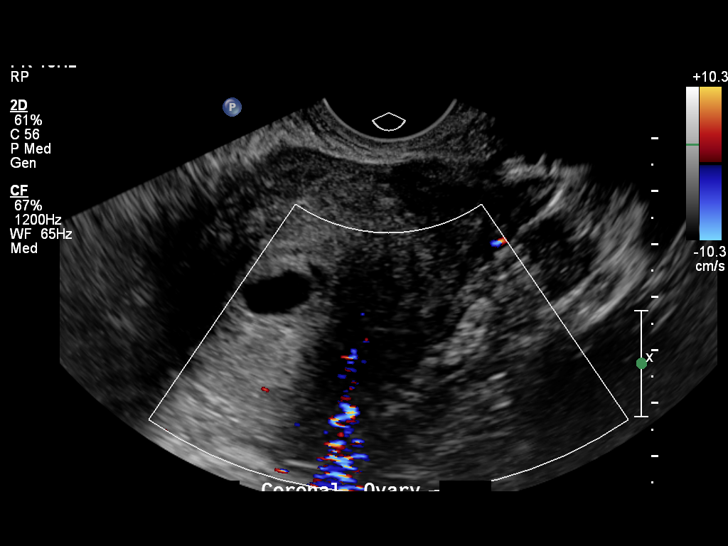
[im 64/64]
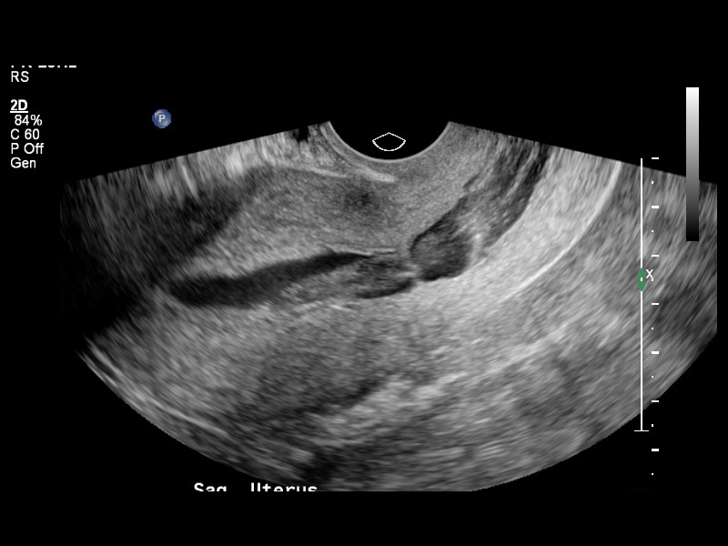

[13 of 25 positions shown; findings below may reference images not displayed]

FINDINGS: Uterus: 10 x 5 x 6 cm.

Endometrium: Similar to 02/23/2013 examination, there is extensive
mobile fluid with mid level echoes distending the endometrial canal
and cervical canal. There is an elongated masslike structure in the
canal without color Doppler flow, measuring at least 5 cm in length
by 1 cm in diameter.

Right ovary:   Normal size, 2.4 x 1.9 x 3 cm.

Left ovary: Normal size, 2.4 x 1.9 x 3.3 cm. Spectral flow not
demonstrated within the left ovary due to its position in the
pelvis. Even so, there are no morphologic features suggestive of
torsion.

Pulsed Doppler evaluation demonstrates normal low-resistance
waveforms in the right ovary.
IMPRESSION: 1. Hemorrhage and fluid distends the endometrial and endocervical
canal, similar to 02/23/2013. Elongated masslike structure within
the canal is also again seen, which may represent blood clot, polyp,
or other mass.
2. Technically difficult documentation of ovarian blood flow. No
morphologic findings to suggest ovarian torsion.

## 2015-02-04 ENCOUNTER — Other Ambulatory Visit: Payer: Self-pay | Admitting: Obstetrics and Gynecology

## 2015-02-05 LAB — CYTOLOGY - PAP

## 2015-02-10 ENCOUNTER — Telehealth: Payer: Self-pay | Admitting: Internal Medicine

## 2015-02-10 NOTE — Telephone Encounter (Signed)
Pre visit letter mailed 02/10/15

## 2015-02-28 ENCOUNTER — Encounter: Payer: Self-pay | Admitting: Behavioral Health

## 2015-02-28 ENCOUNTER — Telehealth: Payer: Self-pay | Admitting: Behavioral Health

## 2015-02-28 NOTE — Telephone Encounter (Signed)
Pre-Visit Call completed with patient and chart updated.   Pre-Visit Info documented in Specialty Comments under [employer].    

## 2015-03-03 ENCOUNTER — Encounter: Payer: Self-pay | Admitting: Internal Medicine

## 2015-03-03 ENCOUNTER — Ambulatory Visit (INDEPENDENT_AMBULATORY_CARE_PROVIDER_SITE_OTHER): Payer: Commercial Managed Care - PPO | Admitting: Internal Medicine

## 2015-03-03 VITALS — BP 130/72 | HR 63 | Temp 97.6°F | Ht 64.0 in | Wt 273.4 lb

## 2015-03-03 DIAGNOSIS — R7303 Prediabetes: Secondary | ICD-10-CM

## 2015-03-03 DIAGNOSIS — Z0000 Encounter for general adult medical examination without abnormal findings: Secondary | ICD-10-CM

## 2015-03-03 DIAGNOSIS — R7309 Other abnormal glucose: Secondary | ICD-10-CM | POA: Diagnosis not present

## 2015-03-03 DIAGNOSIS — Z114 Encounter for screening for human immunodeficiency virus [HIV]: Secondary | ICD-10-CM

## 2015-03-03 DIAGNOSIS — Z1159 Encounter for screening for other viral diseases: Secondary | ICD-10-CM

## 2015-03-03 LAB — COMPREHENSIVE METABOLIC PANEL
ALT: 19 U/L (ref 0–35)
AST: 15 U/L (ref 0–37)
Albumin: 3.9 g/dL (ref 3.5–5.2)
Alkaline Phosphatase: 60 U/L (ref 39–117)
BUN: 11 mg/dL (ref 6–23)
CALCIUM: 9.2 mg/dL (ref 8.4–10.5)
CHLORIDE: 104 meq/L (ref 96–112)
CO2: 29 meq/L (ref 19–32)
CREATININE: 0.7 mg/dL (ref 0.40–1.20)
GFR: 113.16 mL/min (ref >60.00)
Glucose, Bld: 87 mg/dL (ref 70–99)
POTASSIUM: 4.3 meq/L (ref 3.5–5.1)
SODIUM: 139 meq/L (ref 135–145)
Total Bilirubin: 0.7 mg/dL (ref 0.2–1.2)
Total Protein: 7.1 g/dL (ref 6.0–8.3)

## 2015-03-03 LAB — CBC WITH DIFFERENTIAL/PLATELET
BASOS ABS: 0 10*3/uL (ref 0.0–0.1)
Basophils Relative: 0.5 % (ref 0.0–3.0)
Eosinophils Absolute: 0.2 10*3/uL (ref 0.0–0.7)
Eosinophils Relative: 2.2 % (ref 0.0–5.0)
HCT: 39.5 % (ref 36.0–46.0)
Hemoglobin: 12.9 g/dL (ref 12.0–15.0)
LYMPHS ABS: 1.9 10*3/uL (ref 0.7–4.0)
LYMPHS PCT: 19.3 % (ref 12.0–46.0)
MCHC: 32.7 g/dL (ref 30.0–36.0)
MCV: 75.4 fl — ABNORMAL LOW (ref 78.0–100.0)
MONO ABS: 0.6 10*3/uL (ref 0.1–1.0)
Monocytes Relative: 5.8 % (ref 3.0–12.0)
Neutro Abs: 6.9 10*3/uL (ref 1.4–7.7)
Neutrophils Relative %: 72.2 % (ref 43.0–77.0)
PLATELETS: 224 10*3/uL (ref 150.0–400.0)
RBC: 5.24 Mil/uL — ABNORMAL HIGH (ref 3.87–5.11)
RDW: 16 % — ABNORMAL HIGH (ref 11.5–15.5)
WBC: 9.6 10*3/uL (ref 4.0–10.5)

## 2015-03-03 LAB — LIPID PANEL
CHOL/HDL RATIO: 2
Cholesterol: 119 mg/dL (ref 0–200)
HDL: 50.1 mg/dL (ref >39.00)
LDL Cholesterol: 55 mg/dL (ref 0–99)
NonHDL: 68.45
Triglycerides: 67 mg/dL (ref 0.0–149.0)
VLDL: 13.4 mg/dL (ref 0.0–40.0)

## 2015-03-03 LAB — HIV ANTIBODY (ROUTINE TESTING W REFLEX): HIV: NONREACTIVE

## 2015-03-03 LAB — TSH: TSH: 2.22 u[IU]/mL (ref 0.35–4.50)

## 2015-03-03 LAB — HEMOGLOBIN A1C: Hgb A1c MFr Bld: 5.9 % (ref 4.6–6.5)

## 2015-03-03 LAB — HEPATITIS C ANTIBODY: HCV Ab: NEGATIVE

## 2015-03-03 NOTE — Patient Instructions (Signed)
Get your blood work before you leave     Next visit  for a physical exam in one year Please schedule an appointment at the front desk Please come back fasting  

## 2015-03-03 NOTE — Progress Notes (Signed)
Pre visit review using our clinic review tool, if applicable. No additional management support is needed unless otherwise documented below in the visit note. 

## 2015-03-03 NOTE — Assessment & Plan Note (Addendum)
Td ~ 2014 mild cough today, early bronchitiss? >> declined flu shot , pnm shot, RTC once better  CCS: cscope 11-2013, neg  Sees gyn, had a PAP 2016 Next MMG due 04-2015  Diet- exercise-- improved !, + wt loss ing Labs. RTC 1 year

## 2015-03-03 NOTE — Progress Notes (Signed)
Subjective:    Patient ID: Diana Schultz, female    DOB: 18-Sep-1965, 49 y.o.   MRN: [id number]  DOS:  03/03/2015 Type of visit - description :  Complete physical exam, here with her husband Interval history: Feeling well, no concerns, has make some improvements on his diet and exercise and lost weight.  Wt Readings from Last 3 Encounters:  03/03/15 273 lb 6 oz (124.002 kg)  10/03/14 281 lb 4 oz (127.574 kg)  06/10/14 280 lb 12.8 oz (127.37 kg)     Review of Systems Constitutional: No fever. No chills. No unexplained wt changes. No unusual sweats  HEENT: No dental problems, no ear discharge, no facial swelling, no voice changes. No eye discharge, no eye  redness , no  intolerance to light   Respiratory: No wheezing , no  difficulty breathing. Mild cough for a few days, early bronchitis?  Cardiovascular: No CP, no leg swelling , no  Palpitations  GI: no nausea, no vomiting, no diarrhea , no  abdominal pain.  No blood in the stools. No dysphagia, no odynophagia    Endocrine: No polyphagia, no polyuria , no polydipsia  GU: No dysuria, gross hematuria, difficulty urinating. No urinary urgency, no frequency.  Musculoskeletal: No joint swellings or unusual aches or pains  Skin: No change in the color of the skin, palor , no  Rash  Allergic, immunologic: No environmental allergies , no  food allergies  Neurological: No dizziness no  syncope. No headaches. No diplopia, no slurred, no slurred speech, no motor deficits, no facial  Numbness  Hematological: No enlarged lymph nodes, no easy bruising , no unusual bleedings  Psychiatry: No suicidal ideas, no hallucinations, no beavior problems, no confusion.  No unusual/severe anxiety, no depression   Past Medical History  Diagnosis Date  . Ovarian cyst   . Umbilical hernia   . Hypertension     history,lost weight, controlled with diet and exercise, no current med  . DVT (deep venous thrombosis)     Hx at age 55 blood clot in  right leg, d/c birth control pills and no other problems  . Depression   . Diverticulitis   . Diabetes mellitus without complication     borderline - no meds  . Vaginal delivery 1984, 1989    Past Surgical History  Procedure Laterality Date  . Tubal ligation    . Cholecystectomy    . Appendectomy    . Leep    . Dilation and curettage of uterus    . Dilitation & currettage/hystroscopy with novasure ablation N/A 03/23/2013    Procedure: DILATATION & CURETTAGE/HYSTEROSCOPY WITH NOVASURE ABLATION;  Surgeon: Lavonia Drafts, MD;  Location: [facility];  Service: Gynecology;  Laterality: N/A;  . Dilation and curettage of uterus N/A 11/01/2013    Procedure: DILATATION AND CURETTAGE;  Surgeon: Mora Bellman, MD;  Location: [facility];  Service: Gynecology;  Laterality: N/A;  . Ablation  03/23/13    [place]  . Colonoscopy  11/2013    [place]  . Vaginal hysterectomy N/A 12/31/2013    Procedure: HYSTERECTOMY VAGINAL;  Surgeon: Lavonia Drafts, MD;  Location: [facility];  Service: Gynecology;  Laterality: N/A;    Social History   Social History  . Marital Status: Married    Spouse Name: N/A  . Number of Children: 2  . Years of Education: N/A   Occupational History  . CNA, med tech  by training, does prn works    Social History Main Topics  . Smoking  status: Never Smoker   . Smokeless tobacco: Never Used  . Alcohol Use: No     Comment: rare   . Drug Use: No  . Sexual Activity: No   Other Topics Concern  . Not on file   Social History Narrative   Original from [place], household: pt and husband     Family History  Problem Relation Age of Onset  . Hypertension Mother   . Diabetes Mother   . Stroke Mother     early in life  . Cancer Mother     endometrial  . Heart failure Paternal Grandfather   . CAD Other     GP, late in like  . Colon cancer Neg Hx   . Breast cancer Neg Hx   . Mental retardation Father   . Hypertension Sister   . Hypertension Brother        Medication  List    Notice  As of 03/03/2015 11:59 PM   You have not been prescribed any medications.         Objective:   Physical Exam BP 130/72 mmHg  Pulse 63  Temp(Src) 97.6 F (36.4 C) (Oral)  Ht 5\' 4"  (1.626 m)  Wt 273 lb 6 oz (124.002 kg)  BMI 46.90 kg/m2  SpO2 96%  LMP 12/01/2013 General:   Well developed, well nourished . NAD.  Neck:  No thyromegaly HEENT:  Normocephalic . Face symmetric, atraumatic Lungs:  CTA B Normal respiratory effort, no intercostal retractions, no accessory muscle use. Heart: RRR,  no murmur.  No pretibial edema bilaterally  Abdomen:  Not distended, soft, non-tender. No rebound or rigidity.   Skin: Exposed areas without rash. Not pale. Not jaundice Neurologic:  alert & oriented X3.  Speech normal, gait appropriate for age and unassisted Strength symmetric and appropriate for age.  Psych: Cognition and judgment appear intact.  Cooperative with normal attention span and concentration.  Behavior appropriate. No anxious or depressed appearing.    Assessment & Plan:

## 2015-03-04 ENCOUNTER — Encounter: Payer: Self-pay | Admitting: Internal Medicine

## 2015-03-04 ENCOUNTER — Ambulatory Visit (INDEPENDENT_AMBULATORY_CARE_PROVIDER_SITE_OTHER): Payer: Commercial Managed Care - PPO | Admitting: Internal Medicine

## 2015-03-04 VITALS — BP 136/78 | HR 67 | Temp 98.7°F | Ht 64.0 in | Wt 273.0 lb

## 2015-03-04 DIAGNOSIS — J029 Acute pharyngitis, unspecified: Secondary | ICD-10-CM | POA: Diagnosis not present

## 2015-03-04 DIAGNOSIS — R05 Cough: Secondary | ICD-10-CM

## 2015-03-04 DIAGNOSIS — J111 Influenza due to unidentified influenza virus with other respiratory manifestations: Secondary | ICD-10-CM | POA: Diagnosis not present

## 2015-03-04 DIAGNOSIS — R059 Cough, unspecified: Secondary | ICD-10-CM

## 2015-03-04 LAB — POCT INFLUENZA A/B
INFLUENZA A, POC: POSITIVE — AB
Influenza B, POC: POSITIVE — AB

## 2015-03-04 LAB — POCT RAPID STREP A (OFFICE): RAPID STREP A SCREEN: NEGATIVE

## 2015-03-04 MED ORDER — HYDROCODONE-HOMATROPINE 5-1.5 MG/5ML PO SYRP: 5.0000 mL | ORAL_SOLUTION | Freq: Four times a day (QID) | ORAL | Status: DC | PRN

## 2015-03-04 MED ORDER — OSELTAMIVIR PHOSPHATE 75 MG PO CAPS: 75.0000 mg | ORAL_CAPSULE | Freq: Two times a day (BID) | ORAL | Status: DC

## 2015-03-04 NOTE — Progress Notes (Signed)
Pre visit review using our clinic review tool, if applicable. No additional management support is needed unless otherwise documented below in the visit note. 

## 2015-03-04 NOTE — Patient Instructions (Signed)
Rest, fluids , tylenol  Take Tamiflu as prescribed  For cough: Take Mucinex DM twice a day as needed until better  Use hydrocodone if the cough persists, will make you drowsy  Call if not gradually better over the next  10 days  Call anytime if the symptoms are severe, you have high fever, short of breath, chest pain   Influenza Influenza ("the flu") is a viral infection of the respiratory tract. It occurs more often in winter months because people spend more time in close contact with one another. Influenza can make you feel very sick. Influenza easily spreads from person to person (contagious). CAUSES  Influenza is caused by a virus that infects the respiratory tract. You can catch the virus by breathing in droplets from an infected person's cough or sneeze. You can also catch the virus by touching something that was recently contaminated with the virus and then touching your mouth, nose, or eyes. RISKS AND COMPLICATIONS You may be at risk for a more severe case of influenza if you smoke cigarettes, have diabetes, have chronic heart disease (such as heart failure) or lung disease (such as asthma), or if you have a weakened immune system. Elderly people and pregnant women are also at risk for more serious infections. The most common problem of influenza is a lung infection (pneumonia). Sometimes, this problem can require emergency medical care and may be life threatening. SIGNS AND SYMPTOMS  Symptoms typically last 4 to 10 days and may include:  Fever.  Chills.  Headache, body aches, and muscle aches.  Sore throat.  Chest discomfort and cough.  Poor appetite.  Weakness or feeling tired.  Dizziness.  Nausea or vomiting. DIAGNOSIS  Diagnosis of influenza is often made based on your history and a physical exam. A nose or throat swab test can be done to confirm the diagnosis. TREATMENT  In mild cases, influenza goes away on its own. Treatment is directed at relieving symptoms.  For more severe cases, your health care provider may prescribe antiviral medicines to shorten the sickness. Antibiotic medicines are not effective because the infection is caused by a virus, not by bacteria. HOME CARE INSTRUCTIONS  Take medicines only as directed by your health care provider.  Use a cool mist humidifier to make breathing easier.  Get plenty of rest until your temperature returns to normal. This usually takes 3 to 4 days.  Drink enough fluid to keep your urine clear or pale yellow.  Cover yourmouth and nosewhen coughing or sneezing,and wash your handswellto prevent thevirusfrom spreading.  Stay homefromwork orschool untilthe fever is gonefor at least 83full day. PREVENTION  An annual influenza vaccination (flu shot) is the best way to avoid getting influenza. An annual flu shot is now routinely recommended for all adults in the [place] IF:  You experiencechest pain, yourcough worsens,or you producemore mucus.  Youhave nausea,vomiting, ordiarrhea.  Your fever returns or gets worse. SEEK IMMEDIATE MEDICAL CARE IF:  You havetrouble breathing, you become short of breath,or your skin ornails becomebluish.  You have severe painor stiffnessin the neck.  You develop a sudden headache, or pain in the face or ear.  You have nausea or vomiting that you cannot control. MAKE SURE YOU:   Understand these instructions.  Will watch your condition.  Will get help right away if you are not doing well or get worse. Document Released: 06/04/2000 Document Revised: 10/22/2013 Document Reviewed: 09/06/2011 [facility] Patient Information 2015 [place], [place]. This information is not intended to  replace advice given to you by your health care provider. Make sure you discuss any questions you have with your health care provider.  

## 2015-03-04 NOTE — Progress Notes (Signed)
Subjective:    Patient ID: Diana Schultz, female    DOB: [birth year], 49 y.o.   MRN: [id number]  DOS:  03/04/2015 Type of visit - description : Acute visit Interval history: Was seen yesterday for her physical exam, was doing relatively well but last night started with all the following symptoms: Cough, ear pain, chest congestion, anterior chest pain with cough, sore throat, headache. Generalized body aches. She takes care of a patient,  her patient was seen in the ER during the weekend, diagnosed with bronchitis.  Review of Systems Denies nausea, vomiting, diarrhea. No rash  Past Medical History  Diagnosis Date  . Ovarian cyst   . Umbilical hernia   . Hypertension     history,lost weight, controlled with diet and exercise, no current med  . DVT (deep venous thrombosis)     Hx at age 5 blood clot in right leg, d/c birth control pills and no other problems  . Depression   . Diverticulitis   . Diabetes mellitus without complication     borderline - no meds  . Vaginal delivery 1984, 1989    Past Surgical History  Procedure Laterality Date  . Tubal ligation    . Cholecystectomy    . Appendectomy    . Leep    . Dilation and curettage of uterus    . Dilitation & currettage/hystroscopy with novasure ablation N/A 03/23/2013    Procedure: DILATATION & CURETTAGE/HYSTEROSCOPY WITH NOVASURE ABLATION;  Surgeon: Lavonia Drafts, MD;  Location: [facility];  Service: Gynecology;  Laterality: N/A;  . Dilation and curettage of uterus N/A 11/01/2013    Procedure: DILATATION AND CURETTAGE;  Surgeon: Mora Bellman, MD;  Location: [facility];  Service: Gynecology;  Laterality: N/A;  . Ablation  03/23/13    [place]  . Colonoscopy  11/2013    [place]  . Vaginal hysterectomy N/A 12/31/2013    Procedure: HYSTERECTOMY VAGINAL;  Surgeon: Lavonia Drafts, MD;  Location: [facility];  Service: Gynecology;  Laterality: N/A;    Social History   Social History  . Marital Status: Married    Spouse Name:  N/A  . Number of Children: 2  . Years of Education: N/A   Occupational History  . CNA, med tech  by training, does prn works    Social History Main Topics  . Smoking status: Never Smoker   . Smokeless tobacco: Never Used  . Alcohol Use: No     Comment: rare   . Drug Use: No  . Sexual Activity: No   Other Topics Concern  . Not on file   Social History Narrative   Original from [place], household: pt and husband        Medication List       This list is accurate as of: 03/04/15 11:59 PM.  Always use your most recent med list.               HYDROcodone-homatropine 5-1.5 MG/5ML syrup  Commonly known as:  HYCODAN  Take 5 mLs by mouth every 6 (six) hours as needed for cough.     oseltamivir 75 MG capsule  Commonly known as:  TAMIFLU  Take 1 capsule (75 mg total) by mouth 2 (two) times daily.           Objective:   Physical Exam BP 136/78 mmHg  Pulse 67  Temp(Src) 98.7 F (37.1 C) (Oral)  Ht 5\' 4"  (1.626 m)  Wt 273 lb (123.832 kg)  BMI 46.84 kg/m2  SpO2 98%  LMP 12/01/2013 General:   Well developed, well nourished ;  frequent cough noted but not toxic or acutely ill appearing. HEENT:  Normocephalic . Face symmetric, atraumatic. TMs are bulge, the one of the right is slt red. Throat  slightly red, no discharge. Nose congested, sinuses no TTP Lungs: CTA B Normal respiratory effort, no intercostal retractions, no accessory muscle use. Heart: RRR,  no murmur.  No pretibial edema bilaterally  Skin: Not pale. Not jaundice Neurologic:  alert & oriented X3.  Speech normal, gait appropriate for age and unassisted Psych--  Cognition and judgment appear intact.  Cooperative with normal attention span and concentration.  Behavior appropriate. No anxious or depressed appearing.      Assessment & Plan:   Influenza: Strep test negative, flu tests positive. Patient aware of diagnosis, start Tamiflu, see instructions.

## 2015-04-11 ENCOUNTER — Telehealth: Payer: Self-pay | Admitting: Internal Medicine

## 2015-04-11 NOTE — Telephone Encounter (Signed)
Pt called for appt due to ears bothering her. Dr. Larose Kells was full for today. Offered appt with PA. Pt declined stating she prefers not to see PAs.

## 2015-06-20 ENCOUNTER — Telehealth: Payer: Self-pay | Admitting: Internal Medicine

## 2015-06-20 NOTE — Telephone Encounter (Signed)
Caller name: Abigail   Relationship to patient: Self   Can be reached: [phone]  Pharmacy: [facility] [place], [place].  Reason for call: pt is requesting a Rx for Zofran, she says that she is experiencing some congestion and stuffiness.

## 2015-06-20 NOTE — Telephone Encounter (Signed)
Spoke with Pt, informed her Zofran is for N/V not stiffness and congestion. She informed me that she is having N/V. Informed her that since Dr. Larose Kells has never prescribed Zofran for her and she is having acute N/V she will need to be seen. Pt states she guesses she will go to ED.

## 2015-08-18 ENCOUNTER — Ambulatory Visit (INDEPENDENT_AMBULATORY_CARE_PROVIDER_SITE_OTHER): Payer: Commercial Managed Care - PPO | Admitting: Internal Medicine

## 2015-08-18 ENCOUNTER — Encounter: Payer: Self-pay | Admitting: Internal Medicine

## 2015-08-18 ENCOUNTER — Telehealth: Payer: Self-pay | Admitting: Internal Medicine

## 2015-08-18 VITALS — BP 138/76 | HR 76 | Temp 97.6°F | Ht 64.0 in | Wt 284.4 lb

## 2015-08-18 DIAGNOSIS — R7303 Prediabetes: Secondary | ICD-10-CM | POA: Diagnosis not present

## 2015-08-18 DIAGNOSIS — E669 Obesity, unspecified: Secondary | ICD-10-CM | POA: Diagnosis not present

## 2015-08-18 DIAGNOSIS — I10 Essential (primary) hypertension: Secondary | ICD-10-CM | POA: Diagnosis not present

## 2015-08-18 DIAGNOSIS — E119 Type 2 diabetes mellitus without complications: Secondary | ICD-10-CM

## 2015-08-18 DIAGNOSIS — Z09 Encounter for follow-up examination after completed treatment for conditions other than malignant neoplasm: Secondary | ICD-10-CM

## 2015-08-18 LAB — URINALYSIS, ROUTINE W REFLEX MICROSCOPIC
BILIRUBIN URINE: NEGATIVE
HGB URINE DIPSTICK: NEGATIVE
KETONES UR: NEGATIVE
LEUKOCYTES UA: NEGATIVE
NITRITE: NEGATIVE
RBC / HPF: NONE SEEN (ref 0–?)
Specific Gravity, Urine: >=1.03 — AB (ref 1.000–1.030)
TOTAL PROTEIN, URINE-UPE24: NEGATIVE
URINE GLUCOSE: NEGATIVE
UROBILINOGEN UA: 0.2 (ref 0.0–1.0)
pH: 5.5 (ref 5.0–8.0)

## 2015-08-18 NOTE — Progress Notes (Signed)
Pre visit review using our clinic review tool, if applicable. No additional management support is needed unless otherwise documented below in the visit note. 

## 2015-08-18 NOTE — Telephone Encounter (Signed)
Pt called for appt stating bp issues & prediabetic check. Offered pt multiple appts today and offered other days with Dr. Larose Kells. Pt stated she may have to change doctors to get appts after 5:00pm/ Pt stated she needed after hours. Advised pt that we have NP here late today and pt declined stating she has to see MD. Pt said "nevermind" and hung up.

## 2015-08-18 NOTE — Progress Notes (Signed)
Subjective:    Patient ID: Diana Schultz, female    DOB: December 07, 1965, 50 y.o.   MRN: [id number]  DOS:  08/18/2015 Type of visit - description : Acute visit Interval history: 2 weeks ago had a mild headache, decided to check her BP, went to the pharmacy and it was elevated. Since then she has checked several times, sometimes w/ a  Wrist cuff  and it ranged from 141/98 to 181/140. The headache is gone. She did report chest pain: On and off, last few seconds, left-sided, with rest or with activity, no radiation, no associated difficulty breathing/diaphoresis/nausea. Has been going to a "natural doctor", was told the urine showed a lot of protein and sugar. Was prescribed a   natural supplement, BP continue to be elevated.   Review of Systems  Admits to a lot of stress   Past Medical History  Diagnosis Date  . Ovarian cyst   . Umbilical hernia   . Hypertension     history,lost weight, controlled with diet and exercise, no current med  . DVT (deep venous thrombosis) (HCC)     Hx at age 64 blood clot in right leg, d/c birth control pills and no other problems  . Depression   . Diverticulitis   . Diabetes mellitus without complication (HCC)     borderline - no meds  . Vaginal delivery 1984, 1989    Past Surgical History  Procedure Laterality Date  . Tubal ligation    . Cholecystectomy    . Appendectomy    . Leep    . Dilation and curettage of uterus    . Dilitation & currettage/hystroscopy with novasure ablation N/A 03/23/2013    Procedure: DILATATION & CURETTAGE/HYSTEROSCOPY WITH NOVASURE ABLATION;  Surgeon: Lavonia Drafts, MD;  Location: [facility];  Service: Gynecology;  Laterality: N/A;  . Dilation and curettage of uterus N/A 11/01/2013    Procedure: DILATATION AND CURETTAGE;  Surgeon: Mora Bellman, MD;  Location: [facility];  Service: Gynecology;  Laterality: N/A;  . Ablation  03/23/13    [place]  . Colonoscopy  11/2013    [place]  . Vaginal hysterectomy N/A 12/31/2013   Procedure: HYSTERECTOMY VAGINAL;  Surgeon: Lavonia Drafts, MD;  Location: [facility];  Service: Gynecology;  Laterality: N/A;    Social History   Social History  . Marital Status: Married    Spouse Name: N/A  . Number of Children: 2  . Years of Education: N/A   Occupational History  . CNA, med tech  by training, does prn works    Social History Main Topics  . Smoking status: Never Smoker   . Smokeless tobacco: Never Used  . Alcohol Use: No     Comment: rare   . Drug Use: No  . Sexual Activity: No   Other Topics Concern  . Not on file   Social History Narrative   Original from [place], household: pt and husband        Medication List    Notice  As of 08/18/2015 11:59 PM   You have not been prescribed any medications.         Objective:   Physical Exam BP 138/76 mmHg  Pulse 76  Temp(Src) 97.6 F (36.4 C) (Oral)  Ht 5\' 4"  (1.626 m)  Wt 284 lb 6 oz (128.992 kg)  BMI 48.79 kg/m2  SpO2 99%  LMP 12/01/2013 General:   Well developed, well nourished . NAD.  HEENT:  Normocephalic . Face symmetric, atraumatic Lungs:  CTA B  Normal respiratory effort, no intercostal retractions, no accessory muscle use. Heart: RRR,  no murmur.  No pretibial edema bilaterally  Skin: Not pale. Not jaundice Neurologic:  alert & oriented X3.  Speech normal, gait appropriate for age and unassisted Psych--  Cognition and judgment appear intact.  Cooperative with normal attention span and concentration.  Behavior appropriate. No anxious or depressed appearing.      Assessment & Plan:   Assessment    Pre- diabetes (a1c 6.3) HTN H/o depression Morbid obesity H/o  DVT age 25 >>> BCP d/c, no further problems Hysterectomy  Plan: HTN: Currently not taking any medication, BP has been elevated lately. She admits to stress, job related. Takes care of an alzheimer  patient. BPchecked by me manually, 135/90 in both arms. Her BP is slightly elevated,   we agreed to try a low salt diet,  weight loss by reducing calorie intake. Also  avoid OTC decongestant or NSAIDs. She will come back in 2 months in the meantime if her BP is consistently elevated she will call. See instructions. Check a BMP. Morbid obesity: Diet and exercise discussed, Nutritionist referral. Prediabetes: Will check A1c. Was told she has protein and sugar in the urine: Check a UA RTC 2 or 3 months

## 2015-08-18 NOTE — Patient Instructions (Addendum)
GO TO THE LAB : Get the blood work    GO TO THE FRONT DESK   Schedule a routine office visit or check up to be done in 2 or 3 months   Please be fasting   Check the  blood pressure 4 or 5 times a week Be sure your blood pressure is between 110/65 and  140/85. If it is consistently higher or lower, let me know     Low-Sodium Eating Plan Sodium raises blood pressure and causes water to be held in the body. Getting less sodium from food will help lower your blood pressure, reduce any swelling, and protect your heart, liver, and kidneys. We get sodium by adding salt (sodium chloride) to food. Most of our sodium comes from canned, boxed, and frozen foods. Restaurant foods, fast foods, and pizza are also very high in sodium. Even if you take medicine to lower your blood pressure or to reduce fluid in your body, getting less sodium from your food is important. WHAT IS MY PLAN? Most people should limit their sodium intake to 2,300 mg a day. Your health care provider recommends that you limit your sodium intake to __________ a day.  WHAT DO I NEED TO KNOW ABOUT THIS EATING PLAN? For the low-sodium eating plan, you will follow these general guidelines:  Choose foods with a % Daily Value for sodium of less than 5% (as listed on the food label).   Use salt-free seasonings or herbs instead of table salt or sea salt.   Check with your health care provider or pharmacist before using salt substitutes.   Eat fresh foods.  Eat more vegetables and fruits.  Limit canned vegetables. If you do use them, rinse them well to decrease the sodium.   Limit cheese to 1 oz (28 g) per day.   Eat lower-sodium products, often labeled as "lower sodium" or "no salt added."  Avoid foods that contain monosodium glutamate (MSG). MSG is sometimes added to [place] food and some canned foods.  Check food labels (Nutrition Facts labels) on foods to learn how much sodium is in one serving.  Eat more  home-cooked food and less restaurant, buffet, and fast food.  When eating at a restaurant, ask that your food be prepared with less salt, or no salt if possible.  HOW DO I READ FOOD LABELS FOR SODIUM INFORMATION? The Nutrition Facts label lists the amount of sodium in one serving of the food. If you eat more than one serving, you must multiply the listed amount of sodium by the number of servings. Food labels may also identify foods as:  Sodium free--Less than 5 mg in a serving.  Very low sodium--35 mg or less in a serving.  Low sodium--140 mg or less in a serving.  Light in sodium--50% less sodium in a serving. For example, if a food that usually has 300 mg of sodium is changed to become light in sodium, it will have 150 mg of sodium.  Reduced sodium--25% less sodium in a serving. For example, if a food that usually has 400 mg of sodium is changed to reduced sodium, it will have 300 mg of sodium. WHAT FOODS CAN I EAT? Grains Low-sodium cereals, including oats, puffed wheat and rice, and shredded wheat cereals. Low-sodium crackers. Unsalted rice and pasta. Lower-sodium bread.  Vegetables Frozen or fresh vegetables. Low-sodium or reduced-sodium canned vegetables. Low-sodium or reduced-sodium tomato sauce and paste. Low-sodium or reduced-sodium tomato and vegetable juices.  Fruits Fresh, frozen, and canned  fruit. Fruit juice.  Meat and Other Protein Products Low-sodium canned tuna and salmon. Fresh or frozen meat, poultry, seafood, and fish. Lamb. Unsalted nuts. Dried beans, peas, and lentils without added salt. Unsalted canned beans. Homemade soups without salt. Eggs.  Dairy Milk. Soy milk. Ricotta cheese. Low-sodium or reduced-sodium cheeses. Yogurt.  Condiments Fresh and dried herbs and spices. Salt-free seasonings. Onion and garlic powders. Low-sodium varieties of mustard and ketchup. Fresh or refrigerated horseradish. Lemon juice.  Fats and Oils Reduced-sodium salad  dressings. Unsalted butter.  Other Unsalted popcorn and pretzels.  The items listed above may not be a complete list of recommended foods or beverages. Contact your dietitian for more options. WHAT FOODS ARE NOT RECOMMENDED? Grains Instant hot cereals. Bread stuffing, pancake, and biscuit mixes. Croutons. Seasoned rice or pasta mixes. Noodle soup cups. Boxed or frozen macaroni and cheese. Self-rising flour. Regular salted crackers. Vegetables Regular canned vegetables. Regular canned tomato sauce and paste. Regular tomato and vegetable juices. Frozen vegetables in sauces. Salted [place] fries. Olives. Angie Fava. Relishes. Sauerkraut. Salsa. Meat and Other Protein Products Salted, canned, smoked, spiced, or pickled meats, seafood, or fish. Bacon, ham, sausage, hot dogs, corned beef, chipped beef, and packaged luncheon meats. Salt pork. Jerky. Pickled herring. Anchovies, regular canned tuna, and sardines. Salted nuts. Dairy Processed cheese and cheese spreads. Cheese curds. Blue cheese and cottage cheese. Buttermilk.  Condiments Onion and garlic salt, seasoned salt, table salt, and sea salt. Canned and packaged gravies. Worcestershire sauce. Tartar sauce. Barbecue sauce. Teriyaki sauce. Soy sauce, including reduced sodium. Steak sauce. Fish sauce. Oyster sauce. Cocktail sauce. Horseradish that you find on the shelf. Regular ketchup and mustard. Meat flavorings and tenderizers. Bouillon cubes. Hot sauce. Tabasco sauce. Marinades. Taco seasonings. Relishes. Fats and Oils Regular salad dressings. Salted butter. Margarine. Ghee. Bacon fat.  Other Potato and tortilla chips. Corn chips and puffs. Salted popcorn and pretzels. Canned or dried soups. Pizza. Frozen entrees and pot pies.  The items listed above may not be a complete list of foods and beverages to avoid. Contact your dietitian for more information.   This information is not intended to replace advice given to you by your health care  provider. Make sure you discuss any questions you have with your health care provider.   Document Released: 11/27/2001 Document Revised: 06/28/2014 Document Reviewed: 04/11/2013 Elsevier Interactive Patient Education [employer].

## 2015-08-19 DIAGNOSIS — Z09 Encounter for follow-up examination after completed treatment for conditions other than malignant neoplasm: Secondary | ICD-10-CM | POA: Insufficient documentation

## 2015-08-19 LAB — BASIC METABOLIC PANEL
BUN: 13 mg/dL (ref 6–23)
CHLORIDE: 103 meq/L (ref 96–112)
CO2: 30 mEq/L (ref 19–32)
CREATININE: 0.66 mg/dL (ref 0.40–1.20)
Calcium: 9.1 mg/dL (ref 8.4–10.5)
GFR: 120.9 mL/min (ref >60.00)
GLUCOSE: 93 mg/dL (ref 70–99)
POTASSIUM: 3.3 meq/L — AB (ref 3.5–5.1)
Sodium: 140 mEq/L (ref 135–145)

## 2015-08-19 LAB — HEMOGLOBIN A1C: Hgb A1c MFr Bld: 5.9 % (ref 4.6–6.5)

## 2015-08-19 NOTE — Assessment & Plan Note (Addendum)
HTN: Currently not taking any medication, BP has been elevated lately. She admits to stress, job related. Takes care of an alzheimer  patient. BPchecked by me manually, 135/90 in both arms. Her BP is slightly elevated,   we agreed to try a low salt diet, weight loss by reducing calorie intake. Also  avoid OTC decongestant or NSAIDs. She will come back in 2 months in the meantime if her BP is consistently elevated she will call. See instructions. Check a BMP. Morbid obesity: Diet and exercise discussed, Nutritionist referral. Prediabetes: Will check A1c. Was told she has protein and sugar in the urine: Check a UA RTC 2 or 3 months

## 2015-09-22 ENCOUNTER — Ambulatory Visit: Payer: Commercial Managed Care - PPO | Admitting: Dietician

## 2015-10-27 ENCOUNTER — Ambulatory Visit: Payer: Commercial Managed Care - PPO | Admitting: Internal Medicine

## 2016-03-08 ENCOUNTER — Encounter: Payer: Self-pay | Admitting: Internal Medicine

## 2016-03-08 ENCOUNTER — Ambulatory Visit (INDEPENDENT_AMBULATORY_CARE_PROVIDER_SITE_OTHER): Payer: Commercial Managed Care - PPO | Admitting: Internal Medicine

## 2016-03-08 VITALS — BP 122/70 | HR 65 | Temp 98.0°F | Resp 14 | Ht 64.0 in | Wt 290.1 lb

## 2016-03-08 DIAGNOSIS — R739 Hyperglycemia, unspecified: Secondary | ICD-10-CM

## 2016-03-08 DIAGNOSIS — Z0000 Encounter for general adult medical examination without abnormal findings: Secondary | ICD-10-CM | POA: Diagnosis not present

## 2016-03-08 LAB — COMPREHENSIVE METABOLIC PANEL
ALT: 18 U/L (ref 0–35)
AST: 15 U/L (ref 0–37)
Albumin: 3.8 g/dL (ref 3.5–5.2)
Alkaline Phosphatase: 72 U/L (ref 39–117)
BILIRUBIN TOTAL: 0.6 mg/dL (ref 0.2–1.2)
BUN: 15 mg/dL (ref 6–23)
CALCIUM: 9.1 mg/dL (ref 8.4–10.5)
CO2: 29 meq/L (ref 19–32)
CREATININE: 0.77 mg/dL (ref 0.40–1.20)
Chloride: 105 mEq/L (ref 96–112)
GFR: 100.98 mL/min (ref >60.00)
GLUCOSE: 98 mg/dL (ref 70–99)
Potassium: 4 mEq/L (ref 3.5–5.1)
Sodium: 140 mEq/L (ref 135–145)
Total Protein: 7.2 g/dL (ref 6.0–8.3)

## 2016-03-08 LAB — LIPID PANEL
CHOL/HDL RATIO: 2
Cholesterol: 126 mg/dL (ref 0–200)
HDL: 50.4 mg/dL (ref >39.00)
LDL Cholesterol: 64 mg/dL (ref 0–99)
NONHDL: 75.29
Triglycerides: 58 mg/dL (ref 0.0–149.0)
VLDL: 11.6 mg/dL (ref 0.0–40.0)

## 2016-03-08 LAB — HEMOGLOBIN A1C: Hgb A1c MFr Bld: 6 % (ref 4.6–6.5)

## 2016-03-08 NOTE — Assessment & Plan Note (Addendum)
Td 2015; declined flu shot, benefits discussed  CCS: cscope 11-2013, neg  Sees gyn, last  OV last week  Next MMG due 04-2016  Diet- exercise-- has gained 17 pounds over the last year, extensive discussion about diet, exercise and obesity consequences

## 2016-03-08 NOTE — Progress Notes (Signed)
Pre visit review using our clinic review tool, if applicable. No additional management support is needed unless otherwise documented below in the visit note. 

## 2016-03-08 NOTE — Patient Instructions (Signed)
GO TO THE LAB : Get the blood work     GO TO THE FRONT DESK Schedule your next appointment for a   routine checkup in 6-8 months 

## 2016-03-08 NOTE — Progress Notes (Signed)
Subjective:    Patient ID: Diana Schultz, female    DOB: [birth year], 50 y.o.   MRN: [id number]  DOS:  03/08/2016 Type of visit - description : CPX Interval history:Concerned about her weight, otherwise feeling okay  Wt Readings from Last 3 Encounters:  03/08/16 290 lb 2 oz (131.6 kg)  08/18/15 284 lb 6 oz (129 kg)  03/04/15 273 lb (123.8 kg)     Review of Systems  Constitutional: No fever. No chills. No unexplained wt changes. No unusual sweats  HEENT: No dental problems, no ear discharge, no facial swelling, no voice changes. No eye discharge, no eye  redness , no  intolerance to light   Respiratory: No wheezing , no  difficulty breathing. No cough , no mucus production  Cardiovascular: No CP, no leg swelling , no  Palpitations  GI: no nausea, no vomiting, no diarrhea , no  abdominal pain.  No blood in the stools. No dysphagia, no odynophagia    Endocrine: No polyphagia, no polyuria , no polydipsia  GU: No dysuria, gross hematuria, difficulty urinating. No urinary urgency, no frequency.  Musculoskeletal: No joint swellings or unusual aches or pains  Skin: No change in the color of the skin, palor , no  Rash  Allergic, immunologic: No environmental allergies , no  food allergies  Neurological: No dizziness no  syncope. No headaches. No diplopia, no slurred, no slurred speech, no motor deficits, no facial  Numbness  Hematological: No enlarged lymph nodes, no easy bruising , no unusual bleedings  Psychiatry: No suicidal ideas, no hallucinations, no beavior problems, no confusion.  No unusual/severe anxiety, no depression   Past Medical History:  Diagnosis Date  . Depression   . Diabetes mellitus without complication (HCC)    borderline - no meds  . Diverticulitis   . DVT (deep venous thrombosis) (HCC)    Hx at age 7 blood clot in right leg, d/c birth control pills and no other problems  . Hypertension    history,lost weight, controlled with diet and exercise, no  current med  . Ovarian cyst   . Umbilical hernia   . Vaginal delivery 1984, 1989    Past Surgical History:  Procedure Laterality Date  . ABLATION  03/23/13   [facility]  . APPENDECTOMY    . CHOLECYSTECTOMY    . DILATION AND CURETTAGE OF UTERUS    . DILATION AND CURETTAGE OF UTERUS N/A 11/01/2013   Procedure: DILATATION AND CURETTAGE;  Surgeon: Mora Bellman, MD;  Location: [facility];  Service: Gynecology;  Laterality: N/A;  . DILITATION & CURRETTAGE/HYSTROSCOPY WITH NOVASURE ABLATION N/A 03/23/2013   Procedure: DILATATION & CURETTAGE/HYSTEROSCOPY WITH NOVASURE ABLATION;  Surgeon: Lavonia Drafts, MD;  Location: [facility];  Service: Gynecology;  Laterality: N/A;  . LEEP    . TUBAL LIGATION    . VAGINAL HYSTERECTOMY N/A 12/31/2013   Procedure: HYSTERECTOMY VAGINAL;  Surgeon: Lavonia Drafts, MD;  Location: [facility];  Service: Gynecology;  Laterality: N/A;    Social History   Social History  . Marital status: Married    Spouse name: N/A  . Number of children: 2  . Years of education: N/A   Occupational History  . CNA, med tech  by training, does prn works    Social History Main Topics  . Smoking status: Never Smoker  . Smokeless tobacco: Never Used  . Alcohol use No     Comment: rare   . Drug use: No  . Sexual activity: No   Other  Topics Concern  . Not on file   Social History Narrative   Original from [place], household: pt and husband     Family History  Problem Relation Age of Onset  . Hypertension Mother   . Diabetes Mother   . Stroke Mother     early in life  . Endometrial cancer Mother   . Mental retardation Father   . Hypertension Sister   . Hypertension Brother   . Heart failure Paternal Grandfather   . CAD Other     GP, late in like  . Colon cancer Neg Hx   . Breast cancer Neg Hx        Medication List    as of 03/08/2016  8:13 AM   You have not been prescribed any medications.        Objective:   Physical Exam BP 122/70 (BP Location: Left  Arm, Patient Position: Sitting, Cuff Size: Normal)   Pulse 65   Temp 98 F (36.7 C) (Oral)   Resp 14   Ht 5\' 4"  (1.626 m)   Wt 290 lb 2 oz (131.6 kg)   LMP 12/01/2013   SpO2 95%   BMI 49.80 kg/m   General:   Well developed, overweight appearing . NAD.  Neck: No  thyromegaly  HEENT:  Normocephalic . Face symmetric, atraumatic Lungs:  CTA B Normal respiratory effort, no intercostal retractions, no accessory muscle use. Heart: RRR,  no murmur.  No pretibial edema bilaterally  Abdomen:  Not distended, soft, non-tender. No rebound or rigidity.   Skin: Exposed areas without rash. Not pale. Not jaundice Neurologic:  alert & oriented X3.  Speech normal, gait appropriate for age and unassisted Strength symmetric and appropriate for age.  Psych: Cognition and judgment appear intact.  Cooperative with normal attention span and concentration.  Behavior appropriate. No anxious or depressed appearing.    Assessment & Plan:    Assessment    Pre- diabetes (a1c 6.3) HTN H/o depression Morbid obesity H/o  DVT age 13 >>> BCP d/c, no further problems Hysterectomy  PLAN Prediabetes: eye checked ~ 7 -2017, Check A1c. HTN: On no medication, BP today is excellent Morbid obesity: Extensive discussion about diet and exercise RTC 6 months

## 2016-03-08 NOTE — Assessment & Plan Note (Signed)
prediabetes: eye checked ~ 7 -2017, Check A1c. HTN: On no medication, BP today is excellent Morbid obesity: Extensive discussion about diet and exercise RTC 6 months

## 2016-11-05 ENCOUNTER — Ambulatory Visit: Payer: Commercial Managed Care - PPO | Admitting: Internal Medicine

## 2017-03-14 ENCOUNTER — Ambulatory Visit: Payer: Commercial Managed Care - PPO | Admitting: Internal Medicine

## 2017-03-14 ENCOUNTER — Encounter: Payer: Commercial Managed Care - PPO | Admitting: Internal Medicine

## 2017-03-15 DIAGNOSIS — E559 Vitamin D deficiency, unspecified: Secondary | ICD-10-CM | POA: Insufficient documentation

## 2017-04-20 DIAGNOSIS — N9412 Deep dyspareunia: Secondary | ICD-10-CM | POA: Insufficient documentation

## 2017-04-20 DIAGNOSIS — G8929 Other chronic pain: Secondary | ICD-10-CM | POA: Insufficient documentation

## 2017-11-21 ENCOUNTER — Other Ambulatory Visit: Payer: Self-pay

## 2017-11-21 ENCOUNTER — Encounter (HOSPITAL_COMMUNITY): Payer: Self-pay

## 2017-11-21 ENCOUNTER — Inpatient Hospital Stay (HOSPITAL_COMMUNITY)
Admission: AD | Admit: 2017-11-21 | Discharge: 2017-11-21 | Disposition: A | Discharge status: Home / Self Care | Payer: Commercial Managed Care - PPO | Source: Ambulatory Visit | Attending: Obstetrics and Gynecology | Admitting: Obstetrics and Gynecology

## 2017-11-21 DIAGNOSIS — R300 Dysuria: Secondary | ICD-10-CM | POA: Insufficient documentation

## 2017-11-21 DIAGNOSIS — I10 Essential (primary) hypertension: Secondary | ICD-10-CM | POA: Insufficient documentation

## 2017-11-21 DIAGNOSIS — N8110 Cystocele, unspecified: Secondary | ICD-10-CM

## 2017-11-21 DIAGNOSIS — R3989 Other symptoms and signs involving the genitourinary system: Secondary | ICD-10-CM | POA: Diagnosis not present

## 2017-11-21 DIAGNOSIS — N393 Stress incontinence (female) (male): Secondary | ICD-10-CM | POA: Diagnosis not present

## 2017-11-21 DIAGNOSIS — E119 Type 2 diabetes mellitus without complications: Secondary | ICD-10-CM | POA: Diagnosis not present

## 2017-11-21 DIAGNOSIS — R1013 Epigastric pain: Secondary | ICD-10-CM | POA: Diagnosis not present

## 2017-11-21 LAB — URINALYSIS, ROUTINE W REFLEX MICROSCOPIC
Bilirubin Urine: NEGATIVE
Glucose, UA: NEGATIVE mg/dL
Ketones, ur: NEGATIVE mg/dL
Nitrite: NEGATIVE
Protein, ur: 30 mg/dL — AB
Specific Gravity, Urine: 1.02 (ref 1.005–1.030)
pH: 6.5 (ref 5.0–8.0)

## 2017-11-21 LAB — URINALYSIS, MICROSCOPIC (REFLEX)
RBC / HPF: >50 RBC/hpf (ref 0–5)
WBC, UA: >50 WBC/hpf (ref 0–5)

## 2017-11-21 LAB — WET PREP, GENITAL
Clue Cells Wet Prep HPF POC: NONE SEEN
Sperm: NONE SEEN
Trich, Wet Prep: NONE SEEN
Yeast Wet Prep HPF POC: NONE SEEN

## 2017-11-21 LAB — GC/CHLAMYDIA PROBE AMP (~~LOC~~) NOT AT ARMC
Chlamydia: NEGATIVE
Neisseria Gonorrhea: NEGATIVE

## 2017-11-21 MED ORDER — OXYCODONE-ACETAMINOPHEN 5-325 MG PO TABS: 1.0000 | ORAL_TABLET | Freq: Four times a day (QID) | ORAL | 0 refills | Status: DC | PRN

## 2017-11-21 MED ORDER — KETOROLAC TROMETHAMINE 60 MG/2ML IM SOLN
60.0000 mg | Freq: Once | INTRAMUSCULAR | Status: AC
  Administered 2017-11-21: 60 mg via INTRAMUSCULAR
  Filled 2017-11-21: qty 2

## 2017-11-21 MED ORDER — PHENAZOPYRIDINE HCL 200 MG PO TABS: 200.0000 mg | ORAL_TABLET | Freq: Three times a day (TID) | ORAL | 0 refills | Status: DC | PRN

## 2017-11-21 MED ORDER — PHENAZOPYRIDINE HCL 100 MG PO TABS
200.0000 mg | ORAL_TABLET | Freq: Once | ORAL | Status: AC
  Administered 2017-11-21: 200 mg via ORAL
  Filled 2017-11-21: qty 2

## 2017-11-21 MED ORDER — GI COCKTAIL ~~LOC~~
30.0000 mL | Freq: Once | ORAL | Status: AC
  Administered 2017-11-21: 30 mL via ORAL
  Filled 2017-11-21: qty 30

## 2017-11-21 NOTE — MAU Note (Addendum)
Pt reports she woke up at 2am and had to go pee, states she went to the bathroom and felt like something was "pushing out". States she had some burning with urination. Has a lot of pelvic pressure. States pain radiates from pelvis to upper abdomen right underneath her breast bone. States she took 500mg  Tylenol at 2:45a. Pt denies vaginal bleeding or discharge. Hysterectomy ~3 years ago.

## 2017-11-21 NOTE — MAU Note (Signed)
Pressure and burning. Blood in urine. Woke up from sleep in pain.

## 2017-11-21 NOTE — MAU Provider Note (Signed)
Chief Complaint: Dysuria and Abdominal Pain   First Provider Initiated Contact with Patient 11/21/17 [phone]      SUBJECTIVE HPI: Diana Schultz is a 52 y.o. G2P2002 not currently pregnant who presents to maternity admissions reporting dysuria and abdominal pain. She reports waking up this morning around 0200 with abdominal pain and feeling like something was "pushing out". She rates abdominal pain 12/10- has taking Tylenol at 0245 with no relief from pain. She reports sharp stabbing pain in suprapubic region and pressure. She reports associated symptoms of abdominal pain is burning with urination and hematuria. She reports abdominal pain radiates from pelvis to upper abdomen right underneath her breast bone. She denies vaginal bleeding, vaginal itching/burning, h/a, dizziness, n/v, or fever/chills.  She has a hx of Hysterectomy from 3 years ago and has not had vaginal bleeding since.   Past Medical History:  Diagnosis Date  . Diabetes mellitus without complication (HCC)    borderline - no meds  . Diverticulitis   . DVT (deep venous thrombosis) (HCC)    Hx at age 55 blood clot in right leg, d/c birth control pills and no other problems  . Hypertension    history,lost weight, controlled with diet and exercise, no current med  . Ovarian cyst   . Umbilical hernia   . Vaginal delivery 1984, 1989   Past Surgical History:  Procedure Laterality Date  . ABDOMINAL HYSTERECTOMY    . ABLATION  03/23/13   [facility]  . APPENDECTOMY    . CHOLECYSTECTOMY    . DILATION AND CURETTAGE OF UTERUS    . DILATION AND CURETTAGE OF UTERUS N/A 11/01/2013   Procedure: DILATATION AND CURETTAGE;  Surgeon: Mora Bellman, MD;  Location: [facility];  Service: Gynecology;  Laterality: N/A;  . DILITATION & CURRETTAGE/HYSTROSCOPY WITH NOVASURE ABLATION N/A 03/23/2013   Procedure: DILATATION & CURETTAGE/HYSTEROSCOPY WITH NOVASURE ABLATION;  Surgeon: Lavonia Drafts, MD;  Location: [facility];  Service: Gynecology;  Laterality: N/A;   . LEEP    . TUBAL LIGATION    . VAGINAL HYSTERECTOMY N/A 12/31/2013   Procedure: HYSTERECTOMY VAGINAL;  Surgeon: Lavonia Drafts, MD;  Location: [facility];  Service: Gynecology;  Laterality: N/A;   Social History   Socioeconomic History  . Marital status: Married    Spouse name: Not on file  . Number of children: 2  . Years of education: Not on file  . Highest education level: Not on file  Occupational History  . Occupation: CNA, med tech  by training, does prn works  Social Needs  . Financial resource strain: Not on file  . Food insecurity:    Worry: Not on file    Inability: Not on file  . Transportation needs:    Medical: Not on file    Non-medical: Not on file  Tobacco Use  . Smoking status: Never Smoker  . Smokeless tobacco: Never Used  Substance and Sexual Activity  . Alcohol use: No    Comment: rare   . Drug use: No  . Sexual activity: Not Currently    Birth control/protection: Surgical  Lifestyle  . Physical activity:    Days per week: Not on file    Minutes per session: Not on file  . Stress: Not on file  Relationships  . Social connections:    Talks on phone: Not on file    Gets together: Not on file    Attends religious service: Not on file    Active member of club or organization: Not on file  Attends meetings of clubs or organizations: Not on file    Relationship status: Not on file  . Intimate partner violence:    Fear of current or ex partner: Not on file    Emotionally abused: Not on file    Physically abused: Not on file    Forced sexual activity: Not on file  Other Topics Concern  . Not on file  Social History Narrative   Original from [place], household: pt and husband   No current facility-administered medications on file prior to encounter.    No current outpatient medications on file prior to encounter.   Allergies  Allergen Reactions  . Benicar [Olmesartan] Cough  . Lisinopril Cough    ROS:  Review of Systems  Respiratory:  Negative.   Cardiovascular: Negative.   Gastrointestinal: Positive for abdominal pain. Negative for constipation, diarrhea, nausea and vomiting.  Genitourinary: Positive for dysuria and hematuria. Negative for decreased urine volume, difficulty urinating, frequency, urgency, vaginal bleeding and vaginal discharge.  Musculoskeletal: Negative.    I have reviewed patient's Past Medical Hx, Surgical Hx, Family Hx, Social Hx, medications and allergies.   Physical Exam   Patient Vitals for the past 24 hrs:  BP Temp Temp src Pulse Resp SpO2 Height Weight  11/21/17 0700 (!) 165/92 97.8 F (36.6 C) Oral (!) 56 18 98 % - -  11/21/17 0656 (!) 166/83 - - (!) 53 - 98 % - -  11/21/17 0640 (!) 157/66 97.7 F (36.5 C) Oral (!) 56 18 100 % - -  11/21/17 0403 (!) 168/106 97.9 F (36.6 C) Oral (!) 58 20 98 % 5' 3.5" (1.613 m) 275 lb (124.7 kg)   Constitutional: Well-developed, morbid obese female in no acute distress.  Cardiovascular: normal rate Respiratory: normal effort GI: Abd soft, non-tender. Pos BS x 4 Neurologic: Alert and oriented x 4.  GU: Neg CVAT. PELVIC EXAM: scant white creamy discharge, vaginal walls and external genitalia normal, bearing down showed mild anterior cystocele    LAB RESULTS Results for orders placed or performed during the hospital encounter of 11/21/17 (from the past 24 hour(s))  Urinalysis, Routine w reflex microscopic     Status: Abnormal   Collection Time: 11/21/17  4:06 AM  Result Value Ref Range   Color, Urine YELLOW YELLOW   APPearance CLEAR CLEAR   Specific Gravity, Urine 1.020 1.005 - 1.030   pH 6.5 5.0 - 8.0   Glucose, UA NEGATIVE NEGATIVE mg/dL   Hgb urine dipstick LARGE (A) NEGATIVE   Bilirubin Urine NEGATIVE NEGATIVE   Ketones, ur NEGATIVE NEGATIVE mg/dL   Protein, ur 30 (A) NEGATIVE mg/dL   Nitrite NEGATIVE NEGATIVE   Leukocytes, UA LARGE (A) NEGATIVE  Urinalysis, Microscopic (reflex)     Status: Abnormal   Collection Time: 11/21/17  4:06 AM   Result Value Ref Range   RBC / HPF >50 0 - 5 RBC/hpf   WBC, UA >50 0 - 5 WBC/hpf   Bacteria, UA FEW (A) NONE SEEN   Squamous Epithelial / LPF 0-5 0 - 5  Wet prep, genital     Status: Abnormal   Collection Time: 11/21/17  6:40 AM  Result Value Ref Range   Yeast Wet Prep HPF POC NONE SEEN NONE SEEN   Trich, Wet Prep NONE SEEN NONE SEEN   Clue Cells Wet Prep HPF POC NONE SEEN NONE SEEN   WBC, Wet Prep HPF POC FEW (A) NONE SEEN   Sperm NONE SEEN     MAU Management/MDM:  Orders Placed This Encounter  Procedures  . Urine Culture  . Wet prep, genital  . Urinalysis, Routine w reflex microscopic  . Urinalysis, Microscopic (reflex)  . Discharge patient Discharge disposition: 01-Home or Self Care; Discharge patient date: 11/21/2017   Urine culture- pending  Wet prep- negative  UA- protein in urine with large amounts of hgb in urine  Gc/C- negative   Meds ordered this encounter  Medications  . ketorolac (TORADOL) injection 60 mg  . phenazopyridine (PYRIDIUM) tablet 200 mg  . gi cocktail (Maalox,Lidocaine,Donnatal)  . phenazopyridine (PYRIDIUM) 200 MG tablet    Sig: Take 1 tablet (200 mg total) by mouth 3 (three) times daily as needed for pain.    Dispense:  20 tablet    Refill:  0    Order Specific Question:   Supervising Provider    Answer:   Donnamae Jude [[id number]]  . oxyCODONE-acetaminophen (PERCOCET/ROXICET) 5-325 MG tablet    Sig: Take 1 tablet by mouth every 6 (six) hours as needed for severe pain.    Dispense:  10 tablet    Refill:  0    Order Specific Question:   Supervising Provider    Answer:   Donnamae Jude [[id number]]   Treatments in MAU included 60mg  IM toradol and GI cocktail. Patient reports decreased abdominal pain to 5/10. Pyridium 200mg  given to patient in MAU prior to discharge.   Consult Dr Corinna Capra, recommends pyridium for possible bladder spasms until urine culture returns. Okay to discharge home.  Discussed with patient POC and will call with results of urine culture,  Rx for percocet and pyridium sent to patient pharmacy of choice. UTI vs Kidney stone differential diagnosis. Pt discharged.    ASSESSMENT 1. Female cystocele   2. Dysuria   3. Bladder pain   4. Chronic hypertension   5. Epigastric abdominal pain   6. Stress incontinence due to pelvic organ prolapse     PLAN Discharge home F/u with Dr Julien Girt for GYN care  Will call patient with results of urine culture and start Bactrim  Rx for Pyridium and Percocet sent to pharmacy  Return to urgent care or PCP for worsening symptoms  Discussed need and importance to be placed back on medication for Hypertension- patient verbalizes understanding, does not want to be on medication right now- reports medication makes her sick. Educated on symptoms to be seen right away at [facility] ED   Follow-up Information    Marylynn Pearson, MD. Schedule an appointment as soon as possible for a visit.   Specialty:  Obstetrics and Gynecology Why:  Make appointment to be seen for follow up care Contact information: [address] [place] [address] [phone]           Allergies as of 11/21/2017      Reactions   Benicar [olmesartan] Cough   Lisinopril Cough      Medication List    TAKE these medications   oxyCODONE-acetaminophen 5-325 MG tablet Commonly known as:  PERCOCET/ROXICET Take 1 tablet by mouth every 6 (six) hours as needed for severe pain.   phenazopyridine 200 MG tablet Commonly known as:  PYRIDIUM Take 1 tablet (200 mg total) by mouth 3 (three) times daily as needed for pain.      Darrol Poke  Certified Nurse-Midwife 11/21/2017  7:19 AM

## 2017-11-21 NOTE — Discharge Instructions (Signed)
In late 2019, the [facility] will be moving to the [place] [facility]. At that time, the MAU (Maternity Admissions Unit), where you are being seen today, will no longer take care of non-pregnant patients. We strongly encourage you to find a doctor's office before that time, so that you can be seen with any GYN concerns, like vaginal discharge, urinary tract infection, etc.. in a timely manner. ° °In order to make an office visit more convenient, the [facility] will be offering evening hours with same-day appointments, walk-in appointments and scheduled appointments available during this time. ° °[facility] Hours: °Monday - 8am - 7:30 pm with walk-in between 4pm- 7:30 pm °Tuesday - 8 am - 5 pm (starting 09/20/17 we will be open late and accepting walk-ins from 4pm - 7:30pm) °Wednesday - 8 am - 5 pm (starting 12/21/17 we will be open late and accepting walk-ins from 4pm - 7:30pm) °Thursday 8 am - 5 pm (starting 03/23/18 we will be open late and accepting walk-ins from 4pm - 7:30pm) °Friday 8 am - 5 pm ° °For an appointment please call the [facility] at [phone] ° °For urgent needs, [facility] is also available for management of urgent GYN complaints such as vaginal discharge or urinary tract infections. ° ° ° ° ° °

## 2017-11-23 ENCOUNTER — Other Ambulatory Visit: Payer: Self-pay | Admitting: Obstetrics and Gynecology

## 2017-11-23 ENCOUNTER — Ambulatory Visit
Admission: RE | Admit: 2017-11-23 | Discharge: 2017-11-23 | Disposition: A | Discharge status: Home / Self Care | Payer: Commercial Managed Care - PPO | Source: Ambulatory Visit | Attending: Obstetrics and Gynecology | Admitting: Obstetrics and Gynecology

## 2017-11-23 DIAGNOSIS — R319 Hematuria, unspecified: Secondary | ICD-10-CM

## 2017-11-23 DIAGNOSIS — R1030 Lower abdominal pain, unspecified: Secondary | ICD-10-CM

## 2017-11-23 LAB — URINE CULTURE: Culture: >=100000 — AB

## 2017-11-24 ENCOUNTER — Telehealth: Payer: Self-pay | Admitting: Certified Nurse Midwife

## 2017-11-24 NOTE — Telephone Encounter (Signed)
Notified of +UTI. Pt states that Dr. Julien Girt started her on Bactrim 3 days ago. Instructed to complete abx. Follow up with Dr. Julien Girt if sx persist.

## 2017-11-28 ENCOUNTER — Ambulatory Visit (INDEPENDENT_AMBULATORY_CARE_PROVIDER_SITE_OTHER): Payer: Commercial Managed Care - PPO | Admitting: Internal Medicine

## 2017-11-28 ENCOUNTER — Encounter: Payer: Self-pay | Admitting: Internal Medicine

## 2017-11-28 VITALS — BP 162/104 | HR 56 | Temp 97.5°F | Resp 16 | Ht 63.5 in | Wt 274.0 lb

## 2017-11-28 DIAGNOSIS — Z87448 Personal history of other diseases of urinary system: Secondary | ICD-10-CM

## 2017-11-28 DIAGNOSIS — D3502 Benign neoplasm of left adrenal gland: Secondary | ICD-10-CM | POA: Diagnosis not present

## 2017-11-28 DIAGNOSIS — Q2730 Arteriovenous malformation, site unspecified: Secondary | ICD-10-CM

## 2017-11-28 DIAGNOSIS — I10 Essential (primary) hypertension: Secondary | ICD-10-CM | POA: Diagnosis not present

## 2017-11-28 LAB — POC URINALSYSI DIPSTICK (AUTOMATED)
BILIRUBIN UA: NEGATIVE
Glucose, UA: NEGATIVE
Ketones, UA: NEGATIVE
LEUKOCYTES UA: NEGATIVE
NITRITE UA: NEGATIVE
PH UA: 6 (ref 5.0–8.0)
PROTEIN UA: POSITIVE — AB
RBC UA: NEGATIVE
Spec Grav, UA: >=1.03 — AB (ref 1.010–1.025)
UROBILINOGEN UA: 0.2 U/dL

## 2017-11-28 NOTE — Patient Instructions (Signed)
Come back in 1 month   Check the  blood pressure 2 or 3 times a   Week  Be sure your blood pressure is between 110/65 and  135/85. If it is consistently higher or lower, let me know

## 2017-11-28 NOTE — Progress Notes (Signed)
Subjective:    Patient ID: Diana Schultz, female    DOB: 1965-07-27, 52 y.o.   MRN: [id number]  DOS:  11/28/2017 Type of visit - description : Acute visit, last seen 02-2016 Interval history: Developed gross hematuria and severe back pain 11/21/2017, went to the [facility], chart reviewed, I am able to see the results but not a medical note. The pain was severe, at the low back, radiated bilaterally to the mid and lower abdomen. Urinalysis show RBCs, WBCs, eventually the culture showed E. coli and group B streptococcus. She was diagnosed with a cystocele and refer to gynecology Went to see gynecology, she was prescribed Bactrim, and a CT scan and refer to urology.  She is here because she is improving but is not completely back to normal. Gross hematuria cleared ~ 11/24/2017. Back pain gradually decreased, she still have some pain.  Review of Systems Denies fever chills Has some nausea.  No vomiting. No diarrhea, + dysuria. Past Medical History:  Diagnosis Date  . Diabetes mellitus without complication (HCC)    borderline - no meds  . Diverticulitis   . DVT (deep venous thrombosis) (HCC)    Hx at age 89 blood clot in right leg, d/c birth control pills and no other problems  . Hypertension    history,lost weight, controlled with diet and exercise, no current med  . Ovarian cyst   . Umbilical hernia   . Vaginal delivery 1984, 1989    Past Surgical History:  Procedure Laterality Date  . ABDOMINAL HYSTERECTOMY    . ABLATION  03/23/13   [facility]  . APPENDECTOMY    . CHOLECYSTECTOMY    . DILATION AND CURETTAGE OF UTERUS    . DILATION AND CURETTAGE OF UTERUS N/A 11/01/2013   Procedure: DILATATION AND CURETTAGE;  Surgeon: Mora Bellman, MD;  Location: [facility];  Service: Gynecology;  Laterality: N/A;  . DILITATION & CURRETTAGE/HYSTROSCOPY WITH NOVASURE ABLATION N/A 03/23/2013   Procedure: DILATATION & CURETTAGE/HYSTEROSCOPY WITH NOVASURE ABLATION;  Surgeon: Lavonia Drafts, MD;   Location: [facility];  Service: Gynecology;  Laterality: N/A;  . LEEP    . TUBAL LIGATION    . VAGINAL HYSTERECTOMY N/A 12/31/2013   Procedure: HYSTERECTOMY VAGINAL;  Surgeon: Lavonia Drafts, MD;  Location: [facility];  Service: Gynecology;  Laterality: N/A;    Social History   Socioeconomic History  . Marital status: Married    Spouse name: Not on file  . Number of children: 2  . Years of education: Not on file  . Highest education level: Not on file  Occupational History  . Occupation: CNA, med tech  by training, does prn works  Social Needs  . Financial resource strain: Not on file  . Food insecurity:    Worry: Not on file    Inability: Not on file  . Transportation needs:    Medical: Not on file    Non-medical: Not on file  Tobacco Use  . Smoking status: Never Smoker  . Smokeless tobacco: Never Used  Substance and Sexual Activity  . Alcohol use: No    Comment: rare   . Drug use: No  . Sexual activity: Not Currently    Birth control/protection: Surgical  Lifestyle  . Physical activity:    Days per week: Not on file    Minutes per session: Not on file  . Stress: Not on file  Relationships  . Social connections:    Talks on phone: Not on file    Gets together:  Not on file    Attends religious service: Not on file    Active member of club or organization: Not on file    Attends meetings of clubs or organizations: Not on file    Relationship status: Not on file  . Intimate partner violence:    Fear of current or ex partner: Not on file    Emotionally abused: Not on file    Physically abused: Not on file    Forced sexual activity: Not on file  Other Topics Concern  . Not on file  Social History Narrative   Original from [place], household: pt and husband      Allergies as of 11/28/2017      Reactions   Benicar [olmesartan] Cough   Lisinopril Cough      Medication List        Accurate as of 11/28/17  2:47 PM. Always use your most recent med list.            oxyCODONE-acetaminophen 5-325 MG tablet Commonly known as:  PERCOCET/ROXICET Take 1 tablet by mouth every 6 (six) hours as needed for severe pain.   phenazopyridine 200 MG tablet Commonly known as:  PYRIDIUM Take 1 tablet (200 mg total) by mouth 3 (three) times daily as needed for pain.          Objective:   Physical Exam BP (!) 162/104 (BP Location: Left Arm, Patient Position: Sitting, Cuff Size: Large)   Pulse (!) 56   Temp (!) 97.5 F (36.4 C) (Oral)   Resp 16   Ht 5' 3.5" (1.613 m)   Wt 274 lb (124.3 kg)   LMP 12/01/2013 Comment: hx ablation 03/23/2013  SpO2 98%   BMI 47.78 kg/m  General:   Well developed, morbidly obese appearing. NAD.  HEENT:  Normocephalic . Face symmetric, atraumatic Lungs:  CTA B Normal respiratory effort, no intercostal retractions, no accessory muscle use. Heart: RRR,  no murmur.  no pretibial edema bilaterally  Abdomen:  Not distended, soft, slightly tender, upper abdomen with no mass or rebound Skin: Not pale. Not jaundice Neurologic:  alert & oriented X3.  Speech normal, gait appropriate for age and unassisted Psych--  Cognition and judgment appear intact.  Cooperative with normal attention span and concentration.  Behavior appropriate. No anxious or depressed appearing.     Assessment & Plan:  Assessment    Pre- diabetes (a1c 6.3) HTN H/o depression Morbid obesity H/o  DVT age 13 >>> BCP d/c, no further problems Hysterectomy  PLAN Last OV  2017, states she plans to come back here  for her primary care needs  Gross hematuria: Recently developed gross hematuria, back pain with bilateral anterior radiation.  Urinalysis showed both RBC and WBCs, urine culture + E. coli and group B strep.  CAT scan: 1. No findings to account for the patient's history of hematuria or flank pain. 2. Small left adrenal adenoma. 3. Small cluster of dilated blood vessels in the left lower lobe which likely represents a small pulmonary  arteriovenous malformation. This could be further evaluated with nonemergent PE protocol CT scan. 4. Additional incidental findings, as above. (Hepatic steatosis) Recommend: Proceed with urology referral, recheck a UA, urine culture.   HTN: BP today is elevated,states is wnl when checked , declined to restart amlodipine for that reason, rec amb BPs, see AVS AV malformation: per CT report; told patient I like to discuss that with radiology and may be pulmonology then advise her on what to do;  She  strongly requested a referral, likes to see a specialist about that and the chronic cough she has.  Refer to pulmonary Adrenal adenoma per CT: I again advised the patient I we will discuss that with radiology likely he she will need follow-up imaging, she strongly request another opinion and see the specialist, will refer to endocrinology. RTC 1 month.

## 2017-11-28 NOTE — Assessment & Plan Note (Addendum)
Last OV  2017, states she plans to come back here  for her primary care needs  Gross hematuria: Recently developed gross hematuria, back pain with bilateral anterior radiation.  Urinalysis showed both RBC and WBCs, urine culture + E. coli and group B strep.  CAT scan: 1. No findings to account for the patient's history of hematuria or flank pain. 2. Small left adrenal adenoma. 3. Small cluster of dilated blood vessels in the left lower lobe which likely represents a small pulmonary arteriovenous malformation. This could be further evaluated with nonemergent PE protocol CT scan. 4. Additional incidental findings, as above. (Hepatic steatosis) Recommend: Proceed with urology referral, recheck a UA, urine culture.   HTN: BP today is elevated,states is wnl when checked , declined to restart amlodipine for that reason, rec amb BPs, see AVS AV malformation: per CT report; told patient I like to discuss that with radiology and may be pulmonology then advise her on what to do;  She strongly requested a referral, likes to see a specialist about that and the chronic cough she has.  Refer to pulmonary Adrenal adenoma per CT: I again advised the patient I we will discuss that with radiology likely he she will need follow-up imaging, she strongly request another opinion and see the specialist, will refer to endocrinology. RTC 1 month.

## 2017-12-01 ENCOUNTER — Telehealth: Payer: Self-pay | Admitting: *Deleted

## 2017-12-01 NOTE — Telephone Encounter (Signed)
Received Medical records from [facility]; forwarded to provider/SLS 06/13

## 2017-12-08 ENCOUNTER — Encounter: Payer: Self-pay | Admitting: Internal Medicine

## 2017-12-08 ENCOUNTER — Ambulatory Visit (INDEPENDENT_AMBULATORY_CARE_PROVIDER_SITE_OTHER): Payer: Commercial Managed Care - PPO | Admitting: Internal Medicine

## 2017-12-08 VITALS — BP 170/98 | HR 68 | Ht 63.5 in | Wt 274.8 lb

## 2017-12-08 DIAGNOSIS — D3502 Benign neoplasm of left adrenal gland: Secondary | ICD-10-CM

## 2017-12-08 LAB — POTASSIUM: POTASSIUM: 3.7 meq/L (ref 3.5–5.1)

## 2017-12-08 MED ORDER — DEXAMETHASONE 1 MG PO TABS: ORAL_TABLET | ORAL | 0 refills | Status: DC

## 2017-12-08 NOTE — Progress Notes (Signed)
Patient ID: Diana Schultz, female   DOB: 02-Nov-1965, 52 y.o.   MRN: [id number]    HPI  Diana Schultz is a 52 y.o.-year-old femaleemale, referred by her PCP, Dr. Larose Kells, for evaluation and management of a L adrenal incidentaloma.  Pt's adrenal mass was incidentally found this month during investigation for back pain and hematuria, suspicious for kidney stones. She actually had what sounds like uterine prolapse and also a UTI >> treated with Bactrim and Pyridium. She is still feeling poorly, with back and abd. pain   I reviewed pt's previous imaging tests: 01/25/2012: CT abdomen with contrast: Adrenal glands were not reported to be abnormal, however, per my review of the images, the L adenoma was probably there 02/23/2013: CT abdomen without contrast: No clear adrenal adenoma then, possibly due to lack of contrast 11/23/2017: CT abdomen with and without contrast: Left 1.8 x 1.1 cm low-attenuation (-11 HU) adrenal nodule  She c/o fatigue, heat intolerance, moodiness, depression.  Pt. does not have a history of HTN (but BP high today, possibly 2/2 pain), no palpitations, but has occasional headaches.    She has a h/o prediabetes: 03/15/2017: HbA1c 5.6% Lab Results  Component Value Date   HGBA1C 6.0 03/08/2016   She has had instances of mildly low potassium in the past: 03/10/2017: K 3.8 Lab Results  Component Value Date   K 4.0 03/08/2016   K 3.3 (L) 08/18/2015   K 4.3 03/03/2015   K 3.5 06/03/2014   K 3.5 (L) 12/19/2013   K 4.0 02/25/2013   K 3.6 01/24/2012   No cushingoid features: moon facies, disproportional truncal obesity, abdominal purple striae, proximal muscle weakness, acne, hirsutism.  She had hysterectomy in 2014.   ROS: Constitutional: + weight gain, + fatigue, + hot flushes, + poor sleep, + nocturia Eyes: + blurry vision, no xerophthalmia ENT: + sore throat, no nodules palpated in throat, no dysphagia/odynophagia, no hoarseness Cardiovascular: + CP/+ SOB/no palpitations/+ leg  swelling Respiratory: + cough/+ SOB Gastrointestinal: + N/no V/D/C/heartburn Musculoskeletal: + muscle aches/+ joint aches Skin: no rashes, + itching, no easy bruising, + hair loss Neurological: no tremors/numbness/tingling/dizziness, + HA Psychiatric: + depression/no anxiety   Past Medical History:  Diagnosis Date  . Diabetes mellitus without complication (HCC)    borderline - no meds  . Diverticulitis   . DVT (deep venous thrombosis) (HCC)    Hx at age 17 blood clot in right leg, d/c birth control pills and no other problems  . Hypertension    history,lost weight, controlled with diet and exercise, no current med  . Ovarian cyst   . Umbilical hernia   . Vaginal delivery 1984, 1989   Past Surgical History:  Procedure Laterality Date  . ABDOMINAL HYSTERECTOMY    . ABLATION  03/23/13   [facility]  . APPENDECTOMY    . CHOLECYSTECTOMY    . DILATION AND CURETTAGE OF UTERUS    . DILATION AND CURETTAGE OF UTERUS N/A 11/01/2013   Procedure: DILATATION AND CURETTAGE;  Surgeon: Mora Bellman, MD;  Location: [facility];  Service: Gynecology;  Laterality: N/A;  . DILITATION & CURRETTAGE/HYSTROSCOPY WITH NOVASURE ABLATION N/A 03/23/2013   Procedure: DILATATION & CURETTAGE/HYSTEROSCOPY WITH NOVASURE ABLATION;  Surgeon: Lavonia Drafts, MD;  Location: [facility];  Service: Gynecology;  Laterality: N/A;  . LEEP    . TUBAL LIGATION    . VAGINAL HYSTERECTOMY N/A 12/31/2013   Procedure: HYSTERECTOMY VAGINAL;  Surgeon: Lavonia Drafts, MD;  Location: [facility];  Service: Gynecology;  Laterality: N/A;  Social History   Socioeconomic History  . Marital status: Married    Spouse name: Not on file  . Number of children: 2  . Years of education: Not on file  . Highest education level: Not on file  Occupational History  . Occupation: CNA, med tech  by training, does prn works  Social Needs  . Financial resource strain: Not on file  . Food insecurity:    Worry: Not on file    Inability: Not on  file  . Transportation needs:    Medical: Not on file    Non-medical: Not on file  Tobacco Use  . Smoking status: Never Smoker  . Smokeless tobacco: Never Used  Substance and Sexual Activity  . Alcohol use: No    Comment: rare   . Drug use: No  . Sexual activity: Not Currently    Birth control/protection: Surgical  Lifestyle  . Physical activity:    Days per week: Not on file    Minutes per session: Not on file  . Stress: Not on file  Relationships  . Social connections:    Talks on phone: Not on file    Gets together: Not on file    Attends religious service: Not on file    Active member of club or organization: Not on file    Attends meetings of clubs or organizations: Not on file    Relationship status: Not on file  . Intimate partner violence:    Fear of current or ex partner: Not on file    Emotionally abused: Not on file    Physically abused: Not on file    Forced sexual activity: Not on file  Other Topics Concern  . Not on file  Social History Narrative   Original from [place], household: pt and husband   Current Outpatient Medications on File Prior to Visit  Medication Sig Dispense Refill  . Multiple Vitamins-Minerals (MULTIVITAMIN ADULT PO) Take 1 tablet by mouth daily.     No current facility-administered medications on file prior to visit.    Allergies  Allergen Reactions  . Benicar [Olmesartan] Cough  . Lisinopril Cough   Family History  Problem Relation Age of Onset  . Hypertension Mother   . Diabetes Mother   . Stroke Mother        early in life  . Endometrial cancer Mother   . Mental retardation Father   . Breast cancer Father   . Hypertension Sister   . Hypertension Brother   . Heart failure Paternal Grandfather   . CAD Other        GP, late in like  . Colon cancer Neg Hx     PE: BP (!) 170/98   Pulse 68   Ht 5' 3.5" (1.613 m)   Wt 274 lb 12.8 oz (124.6 kg)   LMP 12/01/2013 Comment: hx ablation 03/23/2013  SpO2 98%   BMI 47.92 kg/m   Wt Readings from Last 3 Encounters:  11/28/17 274 lb (124.3 kg)  11/21/17 275 lb (124.7 kg)  03/08/16 290 lb 2 oz (131.6 kg)   Constitutional: obese, in NAD Eyes: PERRLA, EOMI, no exophthalmos, no lid lag, no stare ENT: moist mucous membranes, no thyromegaly, no cervical lymphadenopathy Cardiovascular: RRR, No MRG Respiratory: CTA B Gastrointestinal: abdomen soft, NT, ND, BS+ Musculoskeletal: no deformities, strength intact in all 4 Skin: moist, warm, no rashes Neurological: no tremor with outstretched hands, DTR normal in all 4  ASSESSMENT: 1. Adrenal incidentaloma  PLAN:  1.  Patient with a 1.8 cm L adrenal nodule discovered incidentally. - We discussed about the fact that there are 3 possible scenarios: - A nonfunctioning adrenal nodule - most likely scenario based on the appearance of the nodule - A functioning adrenal adenoma - which can hypersecrete catecholamines/metanephrines, cortisol, or aldosterone - Adrenal cancer/metastasis We do not have blood tests to check for cancer, but the best indicator is lack of change in size and appearance over time. Reviewing the images of her CT abd. From 2013, the L adrenal seem to contain the adenoma but it was was a little smaller.   - To differentiate between a functioning and a nonfunctioning adrenal nodule, we'll need to rule out hypersecretion by checking the following tests  - dexamethasone suppression test to rule out Cushing syndrome (6% of adrenal incidentalomas) - If the cortisol level returns >5, will need 24h urine free cortisol - Plasma fractionated metanephrines and catecholamines to rule out pheochromocytoma (3% of adrenal incidentalomas) - Plasma renin activity and aldosterone level to rule out primary hyperaldosteronism (0.6% of adrenal incidentalomas) - I ordered the above tests today. I advised pt to take the dexamethasone tablets (1 mg total dose, sent to pharmacy) at 11 PM the night before coming to the lab to have a  cortisol level drawn. I also added dexamethasone level. - We discussed about the need for an other CT scan, and I believe that we can wait for another year and then have her back for repeat hormonal testing and ordering a dedicated adrenal CT. I will need to order it with and without contrast, if the Hounsfield units are low and the lesion again appears benign, we might not need to get the contrasted CT for washout.  - we discussed about f/u:   hormonal testing yearly for 5 years   CT scans yearly x1-2 -I would like to obtain another CT scan of the abdomen with focus on the adrenal in 1 to 2 years from the previous. - I explained all the above to the patient, and she agrees with the plan. - I will see her back in a year  Orders Placed This Encounter  Procedures  . Catecholamines, fractionated, plasma  . Aldosterone + renin activity w/ ratio  . Metanephrines, plasma  . Potassium  . Dexamethasone, blood    Standing Status:   Future    Standing Expiration Date:   12/09/2018  . Cortisol-am, blood    Dexamethasone suppression test - pt took 1 mg Dexamethasone the night before    Standing Status:   Future    Standing Expiration Date:   12/09/2018   Office Visit on 12/08/2017  Component Date Value Ref Range Status  . Epinephrine 12/08/2017 see note  pg/mL Final   Comment: Results are below reportable range for this analyte, which is 20 pg/mL. . This test was developed and its analytical performance characteristics have been determined by [place], [place]. It has not been cleared or approved by the U.S. Food and Drug Administration. This assay has been validated pursuant to the CLIA regulations and is used for clinical purposes. .   . Norepinephrine 12/08/2017 728  pg/mL Final   Comment: . This test was developed and its analytical performance characteristics have been determined by [place], [place]. It has not been  cleared or approved by the U.S. Food and Drug Administration. This assay has been validated pursuant to the CLIA regulations and is used for clinical purposes. Marland Kitchen   Marland Kitchen  Dopamine 12/08/2017 see note  pg/mL Final   Comment: Results are below reportable range for this analyte, which is 30 pg/mL. . This test was developed and its analytical performance characteristics have been determined by [place], [place]. It has not been cleared or approved by the U.S. Food and Drug Administration. This assay has been validated pursuant to the CLIA regulations and is used for clinical purposes. .   . Catecholamines, Total 12/08/2017 728  pg/mL Final   Comment: . Adult Reference Ranges for Catecholamines, Plasma . Epinephrine         Supine:  LESS THAN 50 pg/mL                     Upright: LESS THAN 95 pg/mL . Norepinephrine      Supine:  112-658 pg/mL                     Upright: [phone] pg/mL . Dopamine            Supine:  LESS THAN 30 pg/mL                     Upright: LESS THAN 30 pg/mL . Total (N+E)         Supine:  123-671 pg/mL                     Upright: [phone] pg/mL . Pediatric Reference Ranges for Catecholamines, Plasma . Due to stress, plasma catecholamine levels are generally unreliable in infants and small children. Urinary catecholamine assays are more reliable. Marland Kitchen Epinephrine         Supine:  LESS THAN OR EQUAL    3-15 Years                TO 464 pg/mL                     Upright: Not Available . Norepinephrine      Supine:  LESS THAN OR EQUAL    3-15 Years                TO 1251 pg/mL                     Upright: Not Available . Dopamine            Supine:  LESS THAN 60 pg/mL    3-15 Years                                 Upright: Not Available . Pediatric data from [facility] [phone]. . This test was developed and its analytical performance characteristics have been determined by [place], [place]. It has not been cleared or approved by the U.S. Food and Drug Administration. This assay has been validated pursuant to the CLIA regulations and is used for clinical purposes. .   . Aldosterone 12/08/2017 7  *ng/dL Final   Comment: . * Unable to flag abnormal result(s), please refer     to reference range(s) below: . Adult Reference Ranges for Aldosterone, LC/MS/MS:     Upright  8:00 - 10:00 am    < or = 28 ng/dL     Upright  4:00 -  6:00 pm    < or = 21 ng/dL     Supine   8:00 -  10:00 am       3 - 16 ng/dL .   Marland Kitchen Renin Activity 12/08/2017 0.88  0.25 - 5.82 ng/mL/h Final  . ALDO / PRA Ratio 12/08/2017 8.0  0.9 - 28.9 Ratio Final   Comment: . This test was developed and its analytical performance characteristics have been determined by [place], [place]. It has not been cleared or approved by the U.S. Food and Drug Administration. This assay has been validated pursuant to the CLIA regulations and is used for clinical purposes. Joseph Pierini, Free 12/08/2017 26  <=57 pg/mL Final   Comment: . This test was developed and its analytical performance characteristics have been determined by [place], [place]. It has not been cleared or approved by the U.S. Food and Drug Administration. This assay has been validated pursuant to the CLIA regulations and is used for clinical purposes. .   Darol Destine, Free 12/08/2017 54  <=148 pg/mL Final   Comment: . This test was developed and its analytical performance characteristics have been determined by [place], [place]. It has not been cleared or approved by the U.S. Food and Drug Administration. This assay has been validated pursuant to the CLIA regulations and is used for clinical purposes. .   . Total Metanephrines-Plasma 12/08/2017 80  <=205 pg/mL Final   Comment: . For additional information, please refer  to http://education.questdiagnostics.com/faq/MetFractFree (This link is being provided for informational/educatio informational/educational purposes only.) . Elevations >4-fold upper reference range: strongly suggestive of a pheochromocytoma(1). . Elevations >1- 4-fold upper reference range: significant but not diagnostic, may be due to medications or stress. Suggest running 24 hr urine fractionated metanephrines and/or serum Chromagranin A for confirmation. . Reference: . (1)Algeciras-Schimnich A et al, Plasma Chromogranin A or Urine Fractionated Metanephrines Follow-Up Testing Improves the Diagnostic Accuracy of Plasma Fractionated Metanephrines for Pheochromocytoma. The Journal of Clinical Endocrinology # Metabolism 93(1), 91-95, 2008. . . . This test was developed and its analytical performance characteristics have been determined by [place], [place]. It has not been cleared or a                          pproved by the U.S. Food and Drug Administration. This assay has been validated pursuant to the CLIA regulations and is used for clinical purposes. .   . Potassium 12/08/2017 3.7  3.5 - 5.1 mEq/L Final   All labs are normal.  Philemon Kingdom, MD PhD [facility] Endocrinology

## 2017-12-08 NOTE — Patient Instructions (Signed)
Please stop at the lab.  Please take the Dexamethasone tablet at 10-11 pm the night before coming for labs at 8-9 am, fasting.   Please come back for a follow-up appointment in 1 year.  Dexamethasone Suppression Test Why am I having this test? This test helps your health care provider to learn if your adrenal gland is overactive. If it is overactive, this test can also help your health care provider to determine why it is overactive. This test can be used to help diagnose:  Cushing syndrome.  Cushing disease.  Tumors of the adrenal gland.  Mental depression.  This test looks at how your pituitary and adrenal glands are working together. It does this by measuring your body's response to a medicine. There are two types of dexamethasone suppression tests: the prolonged test and the rapid test. What kind of sample is taken? The sample collected will depend on the kind of test that your health care provider chooses to perform:  A prolonged test requires the collection of urine samples over a 24-hour period.  The rapid test requires a blood sample. It is usually collected by inserting a needle into a vein.  Your health care provider will give you the instructions and supplies that you need for the test. How do I prepare for this test?  Follow your health care provider's instructions. Your health care provider will provide you with instructions specific to the kind of test that will be done.  Take medicine only as directed by your health care provider. For both the prolonged and rapid tests, you will need to take a medicine in doses and intervals that are determined by your health care provider. What are the reference ranges? Reference ranges are considered healthy ranges established after testing a large group of healthy people. Reference ranges may vary among different people, labs, and hospitals. It is your responsibility to obtain your test results. Ask the lab or department performing  the test when and how you will get your results. Prolonged Test Reference Ranges  Low dose: greater than 50% reduction of plasma cortisol and 17-hydroxycorticosteroid (17-OCHS) levels.  High dose: greater than 50% reduction of plasma cortisol and 17-OCHS levels. Rapid Test Reference Ranges Nearly 0 (zero) cortisol levels. What do the results mean? Values outside of the reference ranges may mean that your adrenal gland is overactive and that you have:  Cushing disease.  Certain tumors that affect the adrenal gland.  Disorders of the adrenal gland.  Mental depression.  Talk with your health care provider to discuss your results, treatment options, and if necessary, the need for more tests. Talk with your health care provider if you have any questions about your results. Talk with your health care provider to discuss your results, treatment options, and if necessary, the need for more tests. Talk with your health care provider if you have any questions about your results. This information is not intended to replace advice given to you by your health care provider. Make sure you discuss any questions you have with your health care provider. Document Released: 07/09/2004 Document Revised: 02/10/2016 Document Reviewed: 11/01/2013 Elsevier Interactive Patient Education  [employer].

## 2017-12-09 ENCOUNTER — Other Ambulatory Visit: Payer: Commercial Managed Care - PPO

## 2017-12-09 DIAGNOSIS — D3502 Benign neoplasm of left adrenal gland: Secondary | ICD-10-CM

## 2017-12-13 ENCOUNTER — Other Ambulatory Visit: Payer: Self-pay

## 2017-12-13 ENCOUNTER — Telehealth: Payer: Self-pay | Admitting: Internal Medicine

## 2017-12-13 ENCOUNTER — Telehealth: Payer: Self-pay

## 2017-12-13 LAB — ALDOSTERONE + RENIN ACTIVITY W/ RATIO
ALDO / PRA RATIO: 8 ratio (ref 0.9–28.9)
Aldosterone: 7 ng/dL
Renin Activity: 0.88 ng/mL/h (ref 0.25–5.82)

## 2017-12-13 LAB — CATECHOLAMINES, FRACTIONATED, PLASMA
CATECHOLAMINES, TOTAL: 728 pg/mL
Norepinephrine: 728 pg/mL

## 2017-12-13 LAB — CORTISOL-AM, BLOOD: Cortisol - AM: 0.6 ug/dL — ABNORMAL LOW

## 2017-12-13 LAB — METANEPHRINES, PLASMA
METANEPHRINE FREE: 26 pg/mL (ref <=57)
NORMETANEPHRINE FREE: 54 pg/mL (ref <=148)
TOTAL METANEPHRINES-PLASMA: 80 pg/mL (ref <=205)

## 2017-12-13 LAB — DEXAMETHASONE, BLOOD: Dexamethasone, Serum: 274 ng/dL

## 2017-12-13 NOTE — Telephone Encounter (Signed)
Will you please review pt results and I will call her to inform her of them

## 2017-12-13 NOTE — Telephone Encounter (Signed)
Patient is calling for her lab results.  

## 2017-12-13 NOTE — Telephone Encounter (Signed)
Spoke to pt about her results pt stated that she would call back tomorrow because it looks like one result is not in.

## 2017-12-13 NOTE — Telephone Encounter (Signed)
Normal--good. Please come back for a follow-up appointment in 1 year.

## 2017-12-15 ENCOUNTER — Emergency Department (HOSPITAL_COMMUNITY): Payer: Commercial Managed Care - PPO

## 2017-12-15 ENCOUNTER — Emergency Department (HOSPITAL_COMMUNITY)
Admission: EM | Admit: 2017-12-15 | Discharge: 2017-12-15 | Disposition: A | Discharge status: Home / Self Care | Payer: Commercial Managed Care - PPO | Attending: Emergency Medicine | Admitting: Emergency Medicine

## 2017-12-15 ENCOUNTER — Ambulatory Visit: Payer: Self-pay

## 2017-12-15 DIAGNOSIS — R1011 Right upper quadrant pain: Secondary | ICD-10-CM | POA: Diagnosis not present

## 2017-12-15 DIAGNOSIS — E119 Type 2 diabetes mellitus without complications: Secondary | ICD-10-CM | POA: Insufficient documentation

## 2017-12-15 DIAGNOSIS — I10 Essential (primary) hypertension: Secondary | ICD-10-CM | POA: Diagnosis not present

## 2017-12-15 DIAGNOSIS — R109 Unspecified abdominal pain: Secondary | ICD-10-CM

## 2017-12-15 LAB — COMPREHENSIVE METABOLIC PANEL
ALT: 22 U/L (ref 0–44)
ANION GAP: 8 (ref 5–15)
AST: 26 U/L (ref 15–41)
Albumin: 4 g/dL (ref 3.5–5.0)
Alkaline Phosphatase: 72 U/L (ref 38–126)
BUN: 16 mg/dL (ref 6–20)
CHLORIDE: 108 mmol/L (ref 98–111)
CO2: 26 mmol/L (ref 22–32)
Calcium: 9.3 mg/dL (ref 8.9–10.3)
Creatinine, Ser: 0.8 mg/dL (ref 0.44–1.00)
GFR calc non Af Amer: >60 mL/min (ref >60)
Glucose, Bld: 96 mg/dL (ref 70–99)
Potassium: 4.2 mmol/L (ref 3.5–5.1)
SODIUM: 142 mmol/L (ref 135–145)
Total Bilirubin: 1.1 mg/dL (ref 0.3–1.2)
Total Protein: 7.6 g/dL (ref 6.5–8.1)

## 2017-12-15 LAB — URINALYSIS, ROUTINE W REFLEX MICROSCOPIC
Bilirubin Urine: NEGATIVE
GLUCOSE, UA: NEGATIVE mg/dL
Hgb urine dipstick: NEGATIVE
Ketones, ur: NEGATIVE mg/dL
LEUKOCYTES UA: NEGATIVE
Nitrite: NEGATIVE
PH: 6 (ref 5.0–8.0)
Protein, ur: NEGATIVE mg/dL
SPECIFIC GRAVITY, URINE: 1.019 (ref 1.005–1.030)

## 2017-12-15 LAB — CBC WITH DIFFERENTIAL/PLATELET
BASOS ABS: 0 10*3/uL (ref 0.0–0.1)
Basophils Relative: 0 %
Eosinophils Absolute: 0.3 10*3/uL (ref 0.0–0.7)
Eosinophils Relative: 3 %
HEMATOCRIT: 41.6 % (ref 36.0–46.0)
Hemoglobin: 13.3 g/dL (ref 12.0–15.0)
LYMPHS PCT: 27 %
Lymphs Abs: 2.8 10*3/uL (ref 0.7–4.0)
MCH: 25.1 pg — ABNORMAL LOW (ref 26.0–34.0)
MCHC: 32 g/dL (ref 30.0–36.0)
MCV: 78.5 fL (ref 78.0–100.0)
MONO ABS: 0.5 10*3/uL (ref 0.1–1.0)
Monocytes Relative: 5 %
NEUTROS ABS: 6.9 10*3/uL (ref 1.7–7.7)
Neutrophils Relative %: 65 %
PLATELETS: 238 10*3/uL (ref 150–400)
RBC: 5.3 MIL/uL — ABNORMAL HIGH (ref 3.87–5.11)
RDW: 15.8 % — ABNORMAL HIGH (ref 11.5–15.5)
WBC: 10.5 10*3/uL (ref 4.0–10.5)

## 2017-12-15 MED ORDER — OXYCODONE-ACETAMINOPHEN 5-325 MG PO TABS: 1.0000 | ORAL_TABLET | ORAL | 0 refills | Status: DC | PRN

## 2017-12-15 MED ORDER — HYDROMORPHONE HCL 1 MG/ML IJ SOLN
1.0000 mg | Freq: Once | INTRAMUSCULAR | Status: AC
  Administered 2017-12-15: 1 mg via INTRAVENOUS
  Filled 2017-12-15: qty 1

## 2017-12-15 MED ORDER — IOPAMIDOL (ISOVUE-370) INJECTION 76%
INTRAVENOUS | Status: AC
  Filled 2017-12-15: qty 100

## 2017-12-15 MED ORDER — METHOCARBAMOL 500 MG PO TABS
750.0000 mg | ORAL_TABLET | Freq: Once | ORAL | Status: AC
  Administered 2017-12-15: 750 mg via ORAL
  Filled 2017-12-15: qty 2

## 2017-12-15 MED ORDER — METHOCARBAMOL 500 MG PO TABS: 500.0000 mg | ORAL_TABLET | Freq: Three times a day (TID) | ORAL | 0 refills | Status: DC

## 2017-12-15 MED ORDER — HYDROMORPHONE HCL 1 MG/ML IJ SOLN
0.5000 mg | Freq: Once | INTRAMUSCULAR | Status: AC
  Administered 2017-12-15: 0.5 mg via INTRAVENOUS
  Filled 2017-12-15: qty 1

## 2017-12-15 MED ORDER — IOPAMIDOL (ISOVUE-370) INJECTION 76%
100.0000 mL | Freq: Once | INTRAVENOUS | Status: AC | PRN
  Administered 2017-12-15: 100 mL via INTRAVENOUS

## 2017-12-15 MED ORDER — ONDANSETRON HCL 4 MG/2ML IJ SOLN
4.0000 mg | Freq: Once | INTRAMUSCULAR | Status: AC
  Administered 2017-12-15: 4 mg via INTRAVENOUS
  Filled 2017-12-15: qty 2

## 2017-12-15 NOTE — ED Notes (Signed)
Patient transported to CT 

## 2017-12-15 NOTE — Telephone Encounter (Signed)
Noted  

## 2017-12-15 NOTE — ED Provider Notes (Addendum)
[facility] DEPT Provider Note   CSN: [id number] Arrival date & time: 12/15/17  1525     History   Chief Complaint Chief Complaint  Patient presents with  . Flank Pain    HPI Diana Schultz is a 52 y.o. female.  Diana Schultz is a 52 y.o female who presents to the ED complaining of right flank pain that began last night.Pain describes the pain as severe constant and radiating to front of her body. Patient presents with  nausea, but no vomiting or urinary symptoms. Patient was recently treated for a UTI by her PCP, and completed a course of bactrim.She has taken 2 tylenol for the pain last night but had no improvement in symptoms. Patient had a CT study done 3 weeks ago which revealed a mass on the adrenal gland (left side), she has been seen by endocrinoligist.Patient spoke to his PCP's nurse who told her she might have a PE and to come in to ED for further workup. Patient has an appointment scheduled with a pulmonologist.She denies any chest pain, SOB, n/v/d or urinary symptoms.    Flank Pain  Associated symptoms include abdominal pain. Pertinent negatives include no chest pain and no shortness of breath.    Past Medical History:  Diagnosis Date  . Diabetes mellitus without complication (HCC)    borderline - no meds  . Diverticulitis   . DVT (deep venous thrombosis) (HCC)    Hx at age 17 blood clot in right leg, d/c birth control pills and no other problems  . Hypertension    history,lost weight, controlled with diet and exercise, no current med  . Ovarian cyst   . Umbilical hernia   . Vaginal delivery 1984, 1989    Patient Active Problem List   Diagnosis Date Noted  . PCP NOTES >>>>>>>>>>>>>>>>>>>>>>>>>>>>> 08/19/2015  . Pelvic pain in female 11/26/2013  . Annual physical exam 10/31/2013  . HTN (hypertension) 08/29/2013  . Hyperglycemia 08/29/2013    Past Surgical History:  Procedure Laterality Date  . ABDOMINAL HYSTERECTOMY    .  ABLATION  03/23/13   [facility]  . APPENDECTOMY    . CHOLECYSTECTOMY    . DILATION AND CURETTAGE OF UTERUS    . DILATION AND CURETTAGE OF UTERUS N/A 11/01/2013   Procedure: DILATATION AND CURETTAGE;  Surgeon: Mora Bellman, MD;  Location: [facility];  Service: Gynecology;  Laterality: N/A;  . DILITATION & CURRETTAGE/HYSTROSCOPY WITH NOVASURE ABLATION N/A 03/23/2013   Procedure: DILATATION & CURETTAGE/HYSTEROSCOPY WITH NOVASURE ABLATION;  Surgeon: Lavonia Drafts, MD;  Location: [facility];  Service: Gynecology;  Laterality: N/A;  . LEEP    . TUBAL LIGATION    . VAGINAL HYSTERECTOMY N/A 12/31/2013   Procedure: HYSTERECTOMY VAGINAL;  Surgeon: Lavonia Drafts, MD;  Location: [facility];  Service: Gynecology;  Laterality: N/A;     OB History    Gravida  2   Para  2   Term  2   Preterm      AB      Living  2     SAB      TAB      Ectopic      Multiple      Live Births               Home Medications    Prior to Admission medications   Medication Sig Start Date End Date Taking? Authorizing Provider  acetaminophen (TYLENOL) 500 MG tablet Take 500 mg by mouth every 6 (  six) hours as needed for moderate pain.   Yes [provider]  dexamethasone (DECADRON) 1 MG tablet Take 1 tablet by mouth once at 11 pm, before coming for labs at 8 am the next morning Patient not taking: Reported on 12/15/2017 12/08/17   Philemon Kingdom, MD  methocarbamol (ROBAXIN) 500 MG tablet Take 1 tablet (500 mg total) by mouth 3 (three) times daily. 12/15/17   Janeece Fitting, PA-C  oxyCODONE-acetaminophen (PERCOCET/ROXICET) 5-325 MG tablet Take 1 tablet by mouth every 4 (four) hours as needed for severe pain. 12/15/17   Janeece Fitting, PA-C    Family History Family History  Problem Relation Age of Onset  . Hypertension Mother   . Diabetes Mother   . Stroke Mother        early in life  . Endometrial cancer Mother   . Mental retardation Father   . Breast cancer Father   . Hypertension Sister   .  Hypertension Brother   . Heart failure Paternal Grandfather   . CAD Other        GP, late in like  . Colon cancer Neg Hx     Social History Social History   Tobacco Use  . Smoking status: Never Smoker  . Smokeless tobacco: Never Used  Substance Use Topics  . Alcohol use: No    Comment: rare   . Drug use: No     Allergies   Benicar [olmesartan] and Lisinopril   Review of Systems Review of Systems  Respiratory: Negative for shortness of breath.   Cardiovascular: Negative for chest pain.  Gastrointestinal: Positive for abdominal pain and nausea. Negative for diarrhea and vomiting.  Genitourinary: Positive for flank pain (right side only). Negative for dysuria, hematuria and pelvic pain.  Musculoskeletal: Negative for back pain and myalgias.  All other systems reviewed and are negative.    Physical Exam Updated Vital Signs BP (!) 178/91   Pulse 65   Temp 98 F (36.7 C) (Oral)   Resp 15   LMP 12/01/2013 Comment: hx ablation 03/23/2013  SpO2 100%   Physical Exam  Constitutional: She appears well-developed and well-nourished.  HENT:  Head: Normocephalic and atraumatic.  Cardiovascular: Normal rate.  Pulmonary/Chest: Effort normal and breath sounds normal. No respiratory distress.  Abdominal: Soft. Normal appearance and bowel sounds are normal. There is tenderness in the right upper quadrant and epigastric area. There is CVA tenderness (right ). There is negative Murphy's sign.    Musculoskeletal: She exhibits no tenderness or deformity.  Skin: Skin is warm and dry.  Nursing note and vitals reviewed.    ED Treatments / Results  Labs (all labs ordered are listed, but only abnormal results are displayed) Labs Reviewed  CBC WITH DIFFERENTIAL/PLATELET - Abnormal; Notable for the following components:      Result Value   RBC 5.30 (*)    MCH 25.1 (*)    RDW 15.8 (*)    All other components within normal limits  URINE CULTURE  URINALYSIS, ROUTINE W REFLEX  MICROSCOPIC  COMPREHENSIVE METABOLIC PANEL    EKG None  Radiology Ct Angio Chest Pe W And/or Wo Contrast  Result Date: 12/15/2017 CLINICAL DATA:  Abdominal pain. Possible pulmonary arteriovenous malformation noted on a unenhanced abdomen and pelvis CT. EXAM: CT ANGIOGRAPHY CHEST CT ABDOMEN AND PELVIS WITH CONTRAST TECHNIQUE: Multidetector CT imaging of the chest was performed using the standard protocol during bolus administration of intravenous contrast. Multiplanar CT image reconstructions and MIPs were obtained to evaluate the vascular anatomy.  Multidetector CT imaging of the abdomen and pelvis was performed using the standard protocol during bolus administration of intravenous contrast. CONTRAST:  141mL ISOVUE-370 IOPAMIDOL (ISOVUE-370) INJECTION 76% COMPARISON:  Abdomen and pelvis CT, earlier today, 12/15/2017 and 11/23/2017. FINDINGS: CTA CHEST FINDINGS Cardiovascular: There is satisfactory opacification of the pulmonary arteries to the segmental level. There is no evidence of a pulmonary embolism. Heart is mildly enlarged. No pericardial effusion. No coronary artery calcifications. The great vessels are normal in caliber. No aortic dissection or atherosclerosis. Mediastinum/Nodes: No enlarged mediastinal, hilar, or axillary lymph nodes. Thyroid gland, trachea, and esophagus demonstrate no significant findings. Lungs/Pleura: The abnormality noted at the posterior costophrenic sulcus of the left lower lobe on the prior abdomen and pelvis CT is vascular. There are 2 pulmonary arteries that extend to the costophrenic sulcus, both prominent for being this peripheral. There is no defined draining vein. 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 It is unlikely to be of clinical significance. This is not a concerning pulmonary nodule. Lungs are otherwise clear.  No pleural effusion or pneumothorax. Musculoskeletal: No fracture or acute finding. No  osteoblastic or osteolytic lesions. Review of the MIP images confirms the above findings. CT ABDOMEN and PELVIS FINDINGS Hepatobiliary: No focal liver abnormality is seen. Status post cholecystectomy. No biliary dilatation. Pancreas: Unremarkable. No pancreatic ductal dilatation or surrounding inflammatory changes. Spleen: Normal in size without focal abnormality. Adrenals/Urinary Tract: No adrenal masses. Kidneys normal in size, orientation and position with symmetric enhancement and excretion. There are small renal sinus cysts. Small stone noted in the left kidney on the earlier exam is obscured by contrast on the current study. There is no hydronephrosis. Ureters are normal in course and in caliber. Bladder is unremarkable. Stomach/Bowel: Stomach, small bowel and colon are unremarkable. Vascular/Lymphatic: No significant vascular findings are present. No enlarged abdominal or pelvic lymph nodes. Reproductive: Status post hysterectomy. No adnexal masses. Other: No abdominal wall hernia or abnormality. No abdominopelvic ascites. Musculoskeletal: No fracture or acute finding. No osteoblastic or osteolytic lesions. Review of the MIP images confirms the above findings. IMPRESSION: CHEST CTA 1. 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 This is not felt to be of clinical significance normal warrant follow-up evaluation. 2. Lungs otherwise clear. 3. No evidence of a pulmonary embolism. ABDOMEN AND PELVIS CT 1. No acute findings within the abdomen or pelvis. 2. Tiny nonobstructing stone in the left kidney noted on the earlier exam is not visualized current study due to contrast. 3. Small renal sinus cysts in each kidney. 4. No hydronephrosis.  No ureteral stones. 5. Status post cholecystectomy, appendectomy and hysterectomy. Electronically Signed   By: Lajean Manes M.D.   On: 12/15/2017 19:26   Ct Abdomen Pelvis W  Contrast  Result Date: 12/15/2017 CLINICAL DATA:  Abdominal pain. Possible pulmonary arteriovenous malformation noted on a unenhanced abdomen and pelvis CT. EXAM: CT ANGIOGRAPHY CHEST CT ABDOMEN AND PELVIS WITH CONTRAST TECHNIQUE: Multidetector CT imaging of the chest was performed using the standard protocol during bolus administration of intravenous contrast. Multiplanar CT image reconstructions and MIPs were obtained to evaluate the vascular anatomy. Multidetector CT imaging of the abdomen and pelvis was performed using the standard protocol during bolus administration of intravenous contrast. CONTRAST:  121mL ISOVUE-370 IOPAMIDOL (ISOVUE-370) INJECTION 76% COMPARISON:  Abdomen and pelvis CT, earlier today, 12/15/2017 and 11/23/2017. FINDINGS: CTA CHEST FINDINGS Cardiovascular: There is satisfactory opacification of the pulmonary  arteries to the segmental level. There is no evidence of a pulmonary embolism. Heart is mildly enlarged. No pericardial effusion. No coronary artery calcifications. The great vessels are normal in caliber. No aortic dissection or atherosclerosis. Mediastinum/Nodes: No enlarged mediastinal, hilar, or axillary lymph nodes. Thyroid gland, trachea, and esophagus demonstrate no significant findings. Lungs/Pleura: The abnormality noted at the posterior costophrenic sulcus of the left lower lobe on the prior abdomen and pelvis CT is vascular. There are 2 pulmonary arteries that extend to the costophrenic sulcus, both prominent for being this peripheral. There is no defined draining vein. 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 It is unlikely to be of clinical significance. This is not a concerning pulmonary nodule. Lungs are otherwise clear.  No pleural effusion or pneumothorax. Musculoskeletal: No fracture or acute finding. No osteoblastic or osteolytic lesions. Review of the MIP images confirms the above findings. CT ABDOMEN and PELVIS  FINDINGS Hepatobiliary: No focal liver abnormality is seen. Status post cholecystectomy. No biliary dilatation. Pancreas: Unremarkable. No pancreatic ductal dilatation or surrounding inflammatory changes. Spleen: Normal in size without focal abnormality. Adrenals/Urinary Tract: No adrenal masses. Kidneys normal in size, orientation and position with symmetric enhancement and excretion. There are small renal sinus cysts. Small stone noted in the left kidney on the earlier exam is obscured by contrast on the current study. There is no hydronephrosis. Ureters are normal in course and in caliber. Bladder is unremarkable. Stomach/Bowel: Stomach, small bowel and colon are unremarkable. Vascular/Lymphatic: No significant vascular findings are present. No enlarged abdominal or pelvic lymph nodes. Reproductive: Status post hysterectomy. No adnexal masses. Other: No abdominal wall hernia or abnormality. No abdominopelvic ascites. Musculoskeletal: No fracture or acute finding. No osteoblastic or osteolytic lesions. Review of the MIP images confirms the above findings. IMPRESSION: CHEST CTA 1. 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 This is not felt to be of clinical significance normal warrant follow-up evaluation. 2. Lungs otherwise clear. 3. No evidence of a pulmonary embolism. ABDOMEN AND PELVIS CT 1. No acute findings within the abdomen or pelvis. 2. Tiny nonobstructing stone in the left kidney noted on the earlier exam is not visualized current study due to contrast. 3. Small renal sinus cysts in each kidney. 4. No hydronephrosis.  No ureteral stones. 5. Status post cholecystectomy, appendectomy and hysterectomy. Electronically Signed   By: Lajean Manes M.D.   On: 12/15/2017 19:26   Ct Renal Stone Study  Result Date: 12/15/2017 CLINICAL DATA:  Right-sided flank pain 3-4 weeks. EXAM: CT ABDOMEN AND PELVIS WITHOUT  CONTRAST TECHNIQUE: Multidetector CT imaging of the abdomen and pelvis was performed following the standard protocol without IV contrast. COMPARISON:  11/23/2017 and 02/23/2013 FINDINGS: Lower chest: Findings suggesting a small pulmonary AVM over the posterior left lower lobe unchanged from 2014. Hepatobiliary: Previous cholecystectomy. Liver and biliary tree are normal. Pancreas: Normal. Spleen: Normal. Adrenals/Urinary Tract: Adrenal glands are normal. Kidneys are normal in size without hydronephrosis. Punctate nonobstructing stone over the upper pole left kidney. Ureters and bladder are normal. Stomach/Bowel: Stomach and small bowel are normal. Previous appendectomy. Colon is normal. Vascular/Lymphatic: Normal. Reproductive: Previous hysterectomy. Adnexal regions are unremarkable. Other: No significant free fluid or focal inflammatory change. Musculoskeletal: Minimal degenerate change of the spine and hips. IMPRESSION: No acute findings in the abdomen/pelvis. Punctate nonobstructing stone over the upper pole left kidney. No ureteral stones or obstruction. Findings suggesting a small pulmonary AVM  over the posterior left lower lobe unchanged from 2014. Electronically Signed   By: Marin Olp M.D.   On: 12/15/2017 17:35    Procedures Procedures (including critical care time)  Medications Ordered in ED Medications  iopamidol (ISOVUE-370) 76 % injection (has no administration in time range)  HYDROmorphone (DILAUDID) injection 0.5 mg (0.5 mg Intravenous Given 12/15/17 1641)  ondansetron (ZOFRAN) injection 4 mg (4 mg Intravenous Given 12/15/17 1638)  HYDROmorphone (DILAUDID) injection 1 mg (1 mg Intravenous Given 12/15/17 1841)  iopamidol (ISOVUE-370) 76 % injection 100 mL (100 mLs Intravenous Contrast Given 12/15/17 1856)  methocarbamol (ROBAXIN) tablet 750 mg (750 mg Oral Given 12/15/17 1934)     Initial Impression / Assessment and Plan / ED Course  I have reviewed the triage vital signs and the  nursing notes.  Pertinent labs & imaging results that were available during my care of the patient were reviewed by me and considered in my medical decision making (see chart for details).     Patient first encounter with her pacing in room and crying from pain. Pain medication ordered for patient.Patient recently completed a course of bactrim for UTI. I ordered a CT renal study for suspicion of a kidney stone. Imaging results were negative and patient seem to be in pain still. Pain medication was given and a CT ABD & pelvis with contrast was ordered concerning for bleeding, renal infarction. Patient was told by PCP's nurse that she might have a PE, patient was concern about her severe right flank pain therefore CT angio order to r/o pulmonary embolism.Wells score is a 0.  7:27 PM Patient received more pain medication and currently waiting on CT results to move forward with treatment plan.  8:02 PM CT ABD & pelvis show no signs of bleeding or renal infarction. Explained to patient that her pain might musculoskeletal or radicular pain. Although we discussed the source of her pain is unknown, we discussed discharging her with pain control and PCP in the next few days if symptoms don't improve or the pain does not resolve .At this time no further emergent workup is required.  Patients blood pressure has been elevated while in the ED, pain medication was given and blood pressure improved upon receiving. Patient is currently asymptomatic with no signs of end organ damage.No chest pain, headache present at this time. Spoke to patient about following up with PCP for blood pressure management. Patient states she was told she had high blood pressure two months ago but was never place on BP medication. She is currently not taking any medications at this time.  Final Clinical Impressions(s) / ED Diagnoses   Final diagnoses:  Right flank pain    ED Discharge Orders        Ordered    oxyCODONE-acetaminophen  (PERCOCET/ROXICET) 5-325 MG tablet  Every 4 hours PRN     12/15/17 2016    methocarbamol (ROBAXIN) 500 MG tablet  3 times daily     12/15/17 2018       Janeece Fitting, PA-C 12/15/17 2044    Janeece Fitting, PA-C 12/15/17 2047    Merrily Pew, MD 12/15/17 2258

## 2017-12-15 NOTE — ED Triage Notes (Signed)
Pt complains of right sided flank pain for the past 3-4 weeks. Pt was seen by OBGYN, who cultured her urine and prescribed Bactrim. Pt states hematuria improved but still has pain. Pt denies injury. Pt has appointment with urologist next month but states she cant stand pain

## 2017-12-15 NOTE — Discharge Instructions (Addendum)
Today you were seen in the ED and treated for flank pain. We are sending you home with a prescription for pain take as needed. Do not drive while taking this medication,as it can make you drowsy. Please make sure to take a stool softener while taking this medication as it can make you constipated. Follow up with PCP in 2-3 if symptoms don't resolve or pain does not improve and blood pressure managment.Return to the ED if no improvement in symptoms.

## 2017-12-15 NOTE — Telephone Encounter (Signed)
FYI to PCP

## 2017-12-15 NOTE — ED Notes (Signed)
Pt returned from CT at this time. Pt ambulated to restroom with spouse.

## 2017-12-15 NOTE — ED Notes (Signed)
Pt updated on plan of care

## 2017-12-15 NOTE — ED Provider Notes (Signed)
Medical screening examination/treatment/procedure(s) were conducted as a shared visit with non-physician practitioner(s) and myself.  I personally evaluated the patient during the encounter.  52 year old female here with persistent right-sided flank pain.  Patient states that she had a for about 2 or 3 weeks but is been progressively worsening in the last 2 to 3 days has been severe in nature.  She states that it seems colicky.  She initially had hematuria couple weeks ago was treated for urinary tract infection with Bactrim and the hematuria has improved.  No cough, fever.  She does have abnormal sweating every afternoon no matter where she is at.  Even when she is in a cool room at rest she will have an episode of diaphoresis. On exam she is exquisitely tender in the right CVA area.  She is hypertensive back secondary to the pain more than anything.  There is no rash.  No evidence of trauma.  The rest of her abdominal exam is benign.  She says she is been having bowel movements normally twice a day. Prior to my evaluation urine, CT and labs were ready done none of which pointed to the cause of her problems.  I think her high blood pressure is because of her pain however I cannot exclude that it could be a renal problem is causing her pain and her elevated blood pressure so she probably needs a contrasted scan.  Would be the records it does seem that they want to do a contrasted scan of her chest as well so we will add that on for that purpose only.  More pain medication.  Will reevaluate for disposition after work up.  None     Huriel Matt, Corene Cornea, MD 12/15/17 2258

## 2017-12-15 NOTE — Telephone Encounter (Signed)
Pt. C/o back pain to right side and radiates all the way across. More severe on the right. No chest pain or shortness of breath. States "I called my other doctor and they told me to call my PCP first to check and be sure I don't have a blood clot." No availability with Dr. Larose Kells per [facility], [name] Sarna. Pt. Instructed to go to ED for evaluation due to high pain score and if she thinks she may have a blood clot. Verbalizes understanding. Reason for Disposition . Numbness in a leg or foot (i.e., loss of sensation) . [1] SEVERE back pain (e.g., excruciating) AND [2] sudden onset AND [3] age > 28  Answer Assessment - Initial Assessment Questions 1. ONSET: "When did the pain begin?"      Started first of June 2. LOCATION: "Where does it hurt?" (upper, mid or lower back)     Back on the right side  3. SEVERITY: "How bad is the pain?"  (e.g., Scale 1-10; mild, moderate, or severe)   - MILD (1-3): doesn't interfere with normal activities    - MODERATE (4-7): interferes with normal activities or awakens from sleep    - SEVERE (8-10): excruciating pain, unable to do any normal activities      10 4. PATTERN: "Is the pain constant?" (e.g., yes, no; constant, intermittent)      Constant 5. RADIATION: "Does the pain shoot into your legs or elsewhere?"     Around her back 6. CAUSE:  "What do you think is causing the back pain?"      Unsure 7. BACK OVERUSE:  "Any recent lifting of heavy objects, strenuous work or exercise?"     No 8. MEDICATIONS: "What have you taken so far for the pain?" (e.g., nothing, acetaminophen, NSAIDS)     Tylenol - does not help 9. NEUROLOGIC SYMPTOMS: "Do you have any weakness, numbness, or problems with bowel/bladder control?"     No 10. OTHER SYMPTOMS: "Do you have any other symptoms?" (e.g., fever, abdominal pain, burning with urination, blood in urine)       Sweats 11. PREGNANCY: "Is there any chance you are pregnant?" (e.g., yes, no; LMP)       No  Protocols used: BACK  PAIN-A-AH

## 2017-12-15 NOTE — ED Notes (Signed)
Pt requesting more pain medication. Pt reports 7-8/10 pain. PA notified of pt request and pts elevated BP of 209/109. PA to place orders for pain medications.

## 2017-12-16 ENCOUNTER — Telehealth: Payer: Self-pay

## 2017-12-16 ENCOUNTER — Ambulatory Visit: Payer: Self-pay

## 2017-12-16 DIAGNOSIS — D3502 Benign neoplasm of left adrenal gland: Secondary | ICD-10-CM | POA: Insufficient documentation

## 2017-12-16 NOTE — Telephone Encounter (Signed)
Pt. called the office to schedule an appt. to f/u for elevated BP.  Reported was seen in the ER yesterday for right flank pain; was advised her BP is very high (noted 178/91 and 209/109 at ED), and to f/u with PCP for BP management.  This AM c/o headache, nausea and vomiting x 3.  Stated "I don't know if the medication they gave me at the ER made me sick."   BP 157/103, pulse 73 at 9:50 during the triage call.  Denied vision changes, dizziness, or weakness in extremities.  C/o not having any energy.  Stated the right flank pain is constant.  Voiced frustration that this has been going on since the 6/3.  Reported she is seeing Urology next wk. on 7/2.  Advised that no available openings with PCP or other provider in that office.    Phone call to [facility].  Reported pt. was in ER 6/27 for right flank pain.  Advised she was told to f/u for BP management.  Advised of current pt. Complaints.  Advised by [facility] confirmed that Dr. Larose Kells does not have available openings today, nor any other provider at the office.  Dr. Larose Kells recommended that due to H/A and vomiting, and elevated BP, pt. May need to go back to the ER.    Advised pt. Of Dr. Larose Kells' recommendation.  Stated "I just came from the ER."   "I want to either see someone else in their office, or have him call something in for my BP, or I am going to find another doctor, because this is ridiculous!"  Advised will send this Triage note to office and make Dr. Larose Kells aware of pt's complaints.               Reason for Disposition . Systolic BP  >= 676 OR Diastolic >= 195    BP reported last night from ER: 178/91 and 209/109.  BP today 157/103; c/o headache and nausea/ vomiting this AM  Answer Assessment - Initial Assessment Questions 1. BLOOD PRESSURE: "What is the blood pressure?" "Did you take at least two measurements 5 minutes apart?"     157/103, pulse 73  2. ONSET: "When did you take your blood pressure?"     9:50 AM 3. HOW: "How did you obtain the blood pressure?"  (e.g., visiting nurse, automatic home BP monitor)     Digital wrist cuff 4. HISTORY: "Do you have a history of high blood pressure?"     *No Answer* 5. MEDICATIONS: "Are you taking any medications for blood pressure?" "Have you missed any doses recently?"     *No Answer* 6. OTHER SYMPTOMS: "Do you have any symptoms?" (e.g., headache, chest pain, blurred vision, difficulty breathing, weakness)     Denied vision change, dizziness, weakness of extremities;  Does c/o headache, nausea and vomiting 7. PREGNANCY: "Is there any chance you are pregnant?" "When was your last menstrual period?"     *No Answer*  Protocols used: HIGH BLOOD PRESSURE-A-AH

## 2017-12-16 NOTE — Telephone Encounter (Signed)
I think this is not a therapeutic relationship with the patient,  she does need to find somebody else to take care of her. Please start the dismissal process

## 2017-12-16 NOTE — Telephone Encounter (Signed)
Author phoned pt. to review symptoms and schedule a follow-up appointment. Pt. stated she has spoken with three individuals this AM already, but could not remember their names,  And is upset that there is no provider at this practice with availability for today. Author offered to make an appointment with the nurse for a BP check today, as well as an appointment with Dr. Larose Kells for Monday, reminding her of the options to go to the ED or urgent care if HA pain is severe. Pt. stated "I'm not going back to the ED. They just tell me to follow up with my doctor. I'm just going to sit here, eat my sandwich, and then find another doctor". Appointment for 7/10 cancelled. Findings routed to Dr. Larose Kells and Vilma Prader, [place].

## 2017-12-16 NOTE — Telephone Encounter (Signed)
Called patient to schedule an ED follow up appointment.  States she just hung up the phone with someone regarding this. States she was told there were no appointments available in this office today. States she needs BP medication per ED. Patient angry and states she just wants to find another provider at this time. Apologized to patient.

## 2017-12-16 NOTE — Telephone Encounter (Signed)
FYI to PCP regarding Pt's comments to [facility] triage.

## 2017-12-16 NOTE — Telephone Encounter (Signed)
I cannot assess a headache, nausea, vomiting and elevated BP through a phone note. Unfortunately I do not have any openings today. An alternative to the ER would be [facility].

## 2017-12-16 NOTE — Telephone Encounter (Signed)
Dismissal letter printed and forwarded to [place], Engineer, building services.

## 2017-12-17 LAB — URINE CULTURE: Culture: >=100000 — AB

## 2017-12-18 ENCOUNTER — Telehealth: Payer: Self-pay

## 2017-12-18 NOTE — Telephone Encounter (Signed)
No treatment for UC from ED 12/15/17 per [facility]

## 2017-12-19 ENCOUNTER — Telehealth: Payer: Self-pay | Admitting: Internal Medicine

## 2017-12-19 NOTE — Telephone Encounter (Signed)
Patient dismissed from [facility] by Kathlene November MD, effective December 16, 2017. Dismissal letter sent out by certified / registered mail.  daj

## 2017-12-21 ENCOUNTER — Telehealth: Payer: Self-pay | Admitting: Internal Medicine

## 2017-12-21 NOTE — Telephone Encounter (Signed)
Patient stated that a adrenal mass was found on her kidney and Gherghe told her the test came back great, and she need to see her every five years.  Patient is worried and wondering why Gherghe haven't done a biopsy. Please advise   She did not reschedule her appointment for next year with gherghe until she get some answers.

## 2017-12-21 NOTE — Telephone Encounter (Signed)
These masses are not biopsied! The mass appears benign and not growing. I advised her that we need to check labs every years x 5 years to make sure the nodule is not hormone secreting. First labs are normal, so good news. We discussed at length at last visit about this.

## 2017-12-21 NOTE — Telephone Encounter (Signed)
Please advise on below  

## 2017-12-23 ENCOUNTER — Telehealth: Payer: Self-pay

## 2017-12-23 NOTE — Telephone Encounter (Signed)
I would strongly suggest against any Bx.

## 2017-12-23 NOTE — Telephone Encounter (Signed)
Pt is aware and she states that she is getting worse pain and had a CT scan with and then another without contrast. They said she had a cyst on her kidney and stones. She has seen [facility] and they are treating her for kidney stones. She is worried and so unsure what is going on and scared because "so much cancer runs in her family"

## 2017-12-23 NOTE — Telephone Encounter (Signed)
I reviewed the images myself - the nodule is still there, it just was not documented. It looks the same as on prev. CT.

## 2017-12-23 NOTE — Telephone Encounter (Signed)
Pt stated that she feels she needs to see someone who will biopsy the mass, and she will be reaching out to her PCP and will get back with [place]. FYI

## 2017-12-23 NOTE — Telephone Encounter (Signed)
I understand, but her adrenal mass is small and is not causing the symptoms.  She will need to continue to address this with PCP.  I am sorry I cannot help more.

## 2017-12-23 NOTE — Telephone Encounter (Signed)
Pt. phoned author re: dismissal from [facility], stating, "I don't know why Dr. Larose Kells dismissed me", and asking if she can still see her specialists within [place] now. Chief Strategy Officer reviewed documented reasoning for dismissal, and pt. Verbalized understanding. Author unable to address specialty question, so concern forwarded to [place] Johnson, Engineer, building services, for follow-up.

## 2017-12-23 NOTE — Telephone Encounter (Signed)
Pt would like you to review her CT Scans since she was told at the hospital it was not a mass at all. Please advise

## 2017-12-27 ENCOUNTER — Encounter: Payer: Self-pay | Admitting: Pulmonary Disease

## 2017-12-27 ENCOUNTER — Ambulatory Visit (INDEPENDENT_AMBULATORY_CARE_PROVIDER_SITE_OTHER): Payer: Commercial Managed Care - PPO | Admitting: Pulmonary Disease

## 2017-12-27 VITALS — BP 142/88 | HR 65 | Ht 63.0 in | Wt 279.0 lb

## 2017-12-27 DIAGNOSIS — Q2730 Arteriovenous malformation, site unspecified: Secondary | ICD-10-CM

## 2017-12-27 DIAGNOSIS — R0602 Shortness of breath: Secondary | ICD-10-CM | POA: Diagnosis not present

## 2017-12-27 NOTE — Patient Instructions (Signed)
We will order an echocardiogram with bubble study to evaluate the pulmonary AV malformation We will order pulmonary function test and a 6-minute walk test Follow-up in 1 to 2 months to reevaluate

## 2017-12-27 NOTE — Telephone Encounter (Signed)
Calling again, requesting call back. Call back [phone]

## 2017-12-27 NOTE — Progress Notes (Signed)
Diana Schultz    [id number]    05-04-1966  Primary Care Physician:Paz, Alda Berthold, MD  Referring Physician: Colon Branch, MD [address] [place], [place] [address]  Chief complaint: Consult for evaluation of pulmonary AV malformation  HPI: 52 year old with history of hypertension, BMI 14, diverticulitis, history of DVT in her 43s thought to be secondary to contraceptive pills Evaluated in the emergency room on 6/27 for right flank pain.  She had CT chest abdomen pelvis with contrast which showed an incidental finding of a small AV malformation in the left lower lobe.  She has been referred to pulmonary for further evaluation Complains of nonproductive cough, occasional dyspnea, chest tightness that occurs when she has the right flank pain.  No sputum production, wheezing.  No history of GI bleed, anemia, strokes, telangiectasias, epistaxis, hemoptysis, platypnea.  No family history of HHT.  States that her mother and brother suffered from occasional nosebleeds. No known liver problems.  She does not use alcohol, no history of hepatitis. As noted above she has a history of DVT in her 85s thought to be secondary to contraceptive pills.    Pets: 2 dogs, no birds, farm animals Occupation: She is a Quarry manager, Optician, dispensing Exposures: No known exposures, no mold, hot tub, Jacuzzi Smoking history: Never smoker Travel history: Originally from [place].  No significant travel Relevant family history: No significant family history of lung issues or HHT.  Outpatient Encounter Medications as of 12/27/2017  Medication Sig  . acetaminophen (TYLENOL) 500 MG tablet Take 500 mg by mouth every 6 (six) hours as needed for moderate pain.  . [DISCONTINUED] dexamethasone (DECADRON) 1 MG tablet Take 1 tablet by mouth once at 11 pm, before coming for labs at 8 am the next morning  . [DISCONTINUED] methocarbamol (ROBAXIN) 500 MG tablet Take 1 tablet (500 mg total) by mouth 3 (three) times daily.    . [DISCONTINUED] oxyCODONE-acetaminophen (PERCOCET/ROXICET) 5-325 MG tablet Take 1 tablet by mouth every 4 (four) hours as needed for severe pain.   No facility-administered encounter medications on file as of 12/27/2017.     Allergies as of 12/27/2017 - Review Complete 12/27/2017  Allergen Reaction Noted  . Benicar [olmesartan] Cough 10/24/2013  . Lisinopril Cough 06/18/2011    Past Medical History:  Diagnosis Date  . Diabetes mellitus without complication (HCC)    borderline - no meds  . Diverticulitis   . DVT (deep venous thrombosis) (HCC)    Hx at age 12 blood clot in right leg, d/c birth control pills and no other problems  . Hypertension    history,lost weight, controlled with diet and exercise, no current med  . Ovarian cyst   . Umbilical hernia   . Vaginal delivery 1984, 1989    Past Surgical History:  Procedure Laterality Date  . ABDOMINAL HYSTERECTOMY    . ABLATION  03/23/13   [facility]  . APPENDECTOMY    . CHOLECYSTECTOMY    . DILATION AND CURETTAGE OF UTERUS    . DILATION AND CURETTAGE OF UTERUS N/A 11/01/2013   Procedure: DILATATION AND CURETTAGE;  Surgeon: Mora Bellman, MD;  Location: [facility];  Service: Gynecology;  Laterality: N/A;  . DILITATION & CURRETTAGE/HYSTROSCOPY WITH NOVASURE ABLATION N/A 03/23/2013   Procedure: DILATATION & CURETTAGE/HYSTEROSCOPY WITH NOVASURE ABLATION;  Surgeon: Lavonia Drafts, MD;  Location: [facility];  Service: Gynecology;  Laterality: N/A;  . LEEP    . TUBAL LIGATION    .  VAGINAL HYSTERECTOMY N/A 12/31/2013   Procedure: HYSTERECTOMY VAGINAL;  Surgeon: Lavonia Drafts, MD;  Location: [facility];  Service: Gynecology;  Laterality: N/A;    Family History  Problem Relation Age of Onset  . Hypertension Mother   . Diabetes Mother   . Stroke Mother        early in life  . Endometrial cancer Mother   . Mental retardation Father   . Breast cancer Father   . Hypertension Sister   . Hypertension Brother   . Heart failure Paternal  Grandfather   . CAD Other        GP, late in like  . Colon cancer Neg Hx     Social History   Socioeconomic History  . Marital status: Married    Spouse name: Not on file  . Number of children: 2  . Years of education: Not on file  . Highest education level: Not on file  Occupational History  . Occupation: CNA, med tech  by training, does prn works  Social Needs  . Financial resource strain: Not on file  . Food insecurity:    Worry: Not on file    Inability: Not on file  . Transportation needs:    Medical: Not on file    Non-medical: Not on file  Tobacco Use  . Smoking status: Never Smoker  . Smokeless tobacco: Never Used  Substance and Sexual Activity  . Alcohol use: No    Comment: rare   . Drug use: No  . Sexual activity: Not Currently    Birth control/protection: Surgical  Lifestyle  . Physical activity:    Days per week: Not on file    Minutes per session: Not on file  . Stress: Not on file  Relationships  . Social connections:    Talks on phone: Not on file    Gets together: Not on file    Attends religious service: Not on file    Active member of club or organization: Not on file    Attends meetings of clubs or organizations: Not on file    Relationship status: Not on file  . Intimate partner violence:    Fear of current or ex partner: Not on file    Emotionally abused: Not on file    Physically abused: Not on file    Forced sexual activity: Not on file  Other Topics Concern  . Not on file  Social History Narrative   Original from [place], household: pt and husband    Review of systems: Review of Systems  Constitutional: Negative for fever and chills.  HENT: Negative.   Eyes: Negative for blurred vision.  Respiratory: as per HPI  Cardiovascular: Negative for chest pain and palpitations.  Gastrointestinal: Negative for vomiting, diarrhea, blood per rectum. Genitourinary: Negative for dysuria, urgency, frequency and hematuria.  Musculoskeletal:  Negative for myalgias, back pain and joint pain.  Skin: Negative for itching and rash.  Neurological: Negative for dizziness, tremors, focal weakness, seizures and loss of consciousness.  Endo/Heme/Allergies: Negative for environmental allergies.  Psychiatric/Behavioral: Negative for depression, suicidal ideas and hallucinations.  All other systems reviewed and are negative.  Physical Exam: Blood pressure (!) 142/88, pulse 65, height 5\' 3"  (1.6 m), weight 279 lb (126.6 kg), last menstrual period 12/01/2013, SpO2 97 %. Gen:      No acute distress HEENT:  EOMI, sclera anicteric Neck:     No masses; no thyromegaly Lungs:    Clear to auscultation bilaterally; normal respiratory effort CV:  Regular rate and rhythm; no murmurs Abd:      + bowel sounds; soft, non-tender; no palpable masses, no distension Ext:    No edema; adequate peripheral perfusion Skin:      Warm and dry; no rash, telangiectasia. Neuro: alert and oriented x 3 Psych: normal mood and affect  Data Reviewed: CT angio chest abdomen pelvis 12/15/2017- left lower lobe vascular malformation representing small AVM.  Lungs are otherwise clear.  No acute findings in the abdomen, pelvis. I have reviewed the images personally.  CBC    Component Value Date/Time   WBC 10.5 12/15/2017 1643   RBC 5.30 (H) 12/15/2017 1643   HGB 13.3 12/15/2017 1643   HCT 41.6 12/15/2017 1643   PLT 238 12/15/2017 1643   MCV 78.5 12/15/2017 1643   MCH 25.1 (L) 12/15/2017 1643   MCHC 32.0 12/15/2017 1643   RDW 15.8 (H) 12/15/2017 1643   LYMPHSABS 2.8 12/15/2017 1643   MONOABS 0.5 12/15/2017 1643   EOSABS 0.3 12/15/2017 1643   BASOSABS 0.0 12/15/2017 1643   Assessment:  Lt lower lobe Pulmonary AVM Found incidentally during work-up for right flank pain This is an isolated lesion which appears small There is no personal, family history or examination findings suggestive of hereditary hemorrhagic telangiectasia There is no evidence of liver  issues that can explain the pulmonary AV malformation   Suspect that this is congenital and likely to be clinically insignificant I will schedule her for an echocardiogram with bubble study.  If there is a significant shunt as shown by the bubble study then we may consider embolization. Schedule pulmonary function test to check lung function and 6-minute walk test to evaluate if she has hypoxia with exertion.  Plan/Recommendations: - Echo with bubble study, pulmonary function test, 6-minute walk test.  Marshell Garfinkel MD [facility] 12/27/2017, 11:39 AM  CC: Colon Branch, MD

## 2017-12-28 ENCOUNTER — Ambulatory Visit: Payer: Commercial Managed Care - PPO | Admitting: Internal Medicine

## 2017-12-28 NOTE — Telephone Encounter (Signed)
Received signed domestic return receipt verifying delivery of certified letter on December 23, 2017. Article number [phone] [id number] [facility]

## 2017-12-30 ENCOUNTER — Ambulatory Visit (INDEPENDENT_AMBULATORY_CARE_PROVIDER_SITE_OTHER): Payer: Commercial Managed Care - PPO | Admitting: Pulmonary Disease

## 2017-12-30 DIAGNOSIS — R0602 Shortness of breath: Secondary | ICD-10-CM | POA: Diagnosis not present

## 2017-12-30 LAB — PULMONARY FUNCTION TEST
DL/VA % PRED: 127 %
DL/VA: 5.73 ml/min/mmHg/L
DLCO UNC % PRED: 84 %
DLCO unc: 17.85 ml/min/mmHg
FEF 25-75 POST: 4.21 L/s
FEF 25-75 PRE: 3.46 L/s
FEF2575-%Change-Post: 21 %
FEF2575-%PRED-PRE: 163 %
FEF2575-%Pred-Post: 198 %
FEV1-%Change-Post: 2 %
FEV1-%PRED-POST: 109 %
FEV1-%Pred-Pre: 106 %
FEV1-PRE: 2.15 L
FEV1-Post: 2.19 L
FEV1FVC-%Change-Post: 5 %
FEV1FVC-%PRED-PRE: 108 %
FEV6-%CHANGE-POST: -2 %
FEV6-%Pred-Post: 96 %
FEV6-%Pred-Pre: 98 %
FEV6-Post: 2.35 L
FEV6-Pre: 2.42 L
FEV6FVC-%Change-Post: 0 %
FEV6FVC-%Pred-Post: 103 %
FEV6FVC-%Pred-Pre: 102 %
FVC-%Change-Post: -3 %
FVC-%PRED-PRE: 96 %
FVC-%Pred-Post: 93 %
FVC-POST: 2.35 L
FVC-PRE: 2.43 L
POST FEV6/FVC RATIO: 100 %
PRE FEV1/FVC RATIO: 88 %
Post FEV1/FVC ratio: 93 %
Pre FEV6/FVC Ratio: 100 %
RV % pred: 44 %
RV: 0.79 L
TLC % pred: 84 %
TLC: 3.98 L

## 2017-12-30 NOTE — Progress Notes (Signed)
PFT completed today. 12/30/17

## 2018-01-02 ENCOUNTER — Other Ambulatory Visit: Payer: Self-pay

## 2018-01-02 ENCOUNTER — Ambulatory Visit (HOSPITAL_COMMUNITY): Payer: Commercial Managed Care - PPO | Attending: Pulmonary Disease

## 2018-01-02 DIAGNOSIS — Z86718 Personal history of other venous thrombosis and embolism: Secondary | ICD-10-CM | POA: Diagnosis not present

## 2018-01-02 DIAGNOSIS — E119 Type 2 diabetes mellitus without complications: Secondary | ICD-10-CM | POA: Insufficient documentation

## 2018-01-02 DIAGNOSIS — R0602 Shortness of breath: Secondary | ICD-10-CM

## 2018-01-02 DIAGNOSIS — Z6841 Body Mass Index (BMI) 40.0 and over, adult: Secondary | ICD-10-CM | POA: Insufficient documentation

## 2018-01-02 DIAGNOSIS — E669 Obesity, unspecified: Secondary | ICD-10-CM | POA: Diagnosis not present

## 2018-01-02 DIAGNOSIS — I119 Hypertensive heart disease without heart failure: Secondary | ICD-10-CM | POA: Diagnosis not present

## 2018-01-02 DIAGNOSIS — Q2572 Congenital pulmonary arteriovenous malformation: Secondary | ICD-10-CM | POA: Diagnosis not present

## 2018-01-02 DIAGNOSIS — Z8249 Family history of ischemic heart disease and other diseases of the circulatory system: Secondary | ICD-10-CM | POA: Insufficient documentation

## 2018-01-02 MED ORDER — PERFLUTREN LIPID MICROSPHERE
1.0000 mL | INTRAVENOUS | Status: AC | PRN
Start: 2018-01-02 — End: 2018-01-02
  Administered 2018-01-02: 2 mL via INTRAVENOUS

## 2018-01-26 ENCOUNTER — Ambulatory Visit (INDEPENDENT_AMBULATORY_CARE_PROVIDER_SITE_OTHER): Payer: Commercial Managed Care - PPO | Admitting: *Deleted

## 2018-01-26 ENCOUNTER — Ambulatory Visit (INDEPENDENT_AMBULATORY_CARE_PROVIDER_SITE_OTHER): Payer: Commercial Managed Care - PPO | Admitting: Pulmonary Disease

## 2018-01-26 ENCOUNTER — Encounter: Payer: Self-pay | Admitting: Pulmonary Disease

## 2018-01-26 VITALS — BP 120/70 | HR 59 | Ht 63.0 in | Wt 277.0 lb

## 2018-01-26 DIAGNOSIS — R0602 Shortness of breath: Secondary | ICD-10-CM

## 2018-01-26 DIAGNOSIS — Q2730 Arteriovenous malformation, site unspecified: Secondary | ICD-10-CM

## 2018-01-26 NOTE — Progress Notes (Addendum)
Diana Schultz    [id number]    Mar 31, 1966  Primary Care Physician:Furr, Cleone Slim., MD  Referring Physician: Colon Branch, MD [address] [place], [place] [address]  Chief complaint: Follow up for pulmonary AV malformation  HPI: 52 year old with history of hypertension, BMI 60, diverticulitis, history of DVT in her 70s thought to be secondary to contraceptive pills Evaluated in the emergency room on 6/27 for right flank pain.  She had CT chest abdomen pelvis with contrast which showed an incidental finding of a small AV malformation in the left lower lobe.  She has been referred to pulmonary for further evaluation Complains of nonproductive cough, occasional dyspnea, chest tightness that occurs when she has the right flank pain.  No sputum production, wheezing.  No history of GI bleed, anemia, strokes, telangiectasias, epistaxis, hemoptysis, platypnea.  No family history of HHT.  States that her mother and brother suffered from occasional nosebleeds. No known liver problems.  She does not use alcohol, no history of hepatitis. As noted above she has a history of DVT in her 29s thought to be secondary to contraceptive pills.    Pets: 2 dogs, no birds, farm animals Occupation: She is a Quarry manager, Optician, dispensing Exposures: No known exposures, no mold, hot tub, Jacuzzi Smoking history: Never smoker Travel history: Originally from [place].  No significant travel Relevant family history: No significant family history of lung issues or HHT.  Interim History: She is here for review of echo, 6-minute walk test.  States that she remains asymptomatic. Denies any dyspnea, cough  Outpatient Encounter Medications as of 01/26/2018  Medication Sig  . spironolactone (ALDACTONE) 50 MG tablet Take 50 mg by mouth 2 (two) times daily.  Marland Kitchen acetaminophen (TYLENOL) 500 MG tablet Take 500 mg by mouth every 6 (six) hours as needed for moderate pain.   No facility-administered encounter  medications on file as of 01/26/2018.     Allergies as of 01/26/2018 - Review Complete 01/26/2018  Allergen Reaction Noted  . Benicar [olmesartan] Cough 10/24/2013  . Lisinopril Cough 06/18/2011    Past Medical History:  Diagnosis Date  . Diabetes mellitus without complication (HCC)    borderline - no meds  . Diverticulitis   . DVT (deep venous thrombosis) (HCC)    Hx at age 48 blood clot in right leg, d/c birth control pills and no other problems  . Hypertension    history,lost weight, controlled with diet and exercise, no current med  . Ovarian cyst   . Umbilical hernia   . Vaginal delivery 1984, 1989    Past Surgical History:  Procedure Laterality Date  . ABDOMINAL HYSTERECTOMY    . ABLATION  03/23/13   [facility]  . APPENDECTOMY    . CHOLECYSTECTOMY    . DILATION AND CURETTAGE OF UTERUS    . DILATION AND CURETTAGE OF UTERUS N/A 11/01/2013   Procedure: DILATATION AND CURETTAGE;  Surgeon: Mora Bellman, MD;  Location: [facility];  Service: Gynecology;  Laterality: N/A;  . DILITATION & CURRETTAGE/HYSTROSCOPY WITH NOVASURE ABLATION N/A 03/23/2013   Procedure: DILATATION & CURETTAGE/HYSTEROSCOPY WITH NOVASURE ABLATION;  Surgeon: Lavonia Drafts, MD;  Location: [facility];  Service: Gynecology;  Laterality: N/A;  . LEEP    . TUBAL LIGATION    . VAGINAL HYSTERECTOMY N/A 12/31/2013   Procedure: HYSTERECTOMY VAGINAL;  Surgeon: Lavonia Drafts, MD;  Location: [facility];  Service: Gynecology;  Laterality: N/A;    Family History  Problem  Relation Age of Onset  . Hypertension Mother   . Diabetes Mother   . Stroke Mother        early in life  . Endometrial cancer Mother   . Mental retardation Father   . Breast cancer Father   . Hypertension Sister   . Hypertension Brother   . Heart failure Paternal Grandfather   . CAD Other        GP, late in like  . Colon cancer Neg Hx     Social History   Socioeconomic History  . Marital status: Married    Spouse name: Not on file  .  Number of children: 2  . Years of education: Not on file  . Highest education level: Not on file  Occupational History  . Occupation: CNA, med tech  by training, does prn works  Social Needs  . Financial resource strain: Not on file  . Food insecurity:    Worry: Not on file    Inability: Not on file  . Transportation needs:    Medical: Not on file    Non-medical: Not on file  Tobacco Use  . Smoking status: Never Smoker  . Smokeless tobacco: Never Used  Substance and Sexual Activity  . Alcohol use: No    Comment: rare   . Drug use: No  . Sexual activity: Not Currently    Birth control/protection: Surgical  Lifestyle  . Physical activity:    Days per week: Not on file    Minutes per session: Not on file  . Stress: Not on file  Relationships  . Social connections:    Talks on phone: Not on file    Gets together: Not on file    Attends religious service: Not on file    Active member of club or organization: Not on file    Attends meetings of clubs or organizations: Not on file    Relationship status: Not on file  . Intimate partner violence:    Fear of current or ex partner: Not on file    Emotionally abused: Not on file    Physically abused: Not on file    Forced sexual activity: Not on file  Other Topics Concern  . Not on file  Social History Narrative   Original from [place], household: pt and husband    Review of systems: Review of Systems  Constitutional: Negative for fever and chills.  HENT: Negative.   Eyes: Negative for blurred vision.  Respiratory: as per HPI  Cardiovascular: Negative for chest pain and palpitations.  Gastrointestinal: Negative for vomiting, diarrhea, blood per rectum. Genitourinary: Negative for dysuria, urgency, frequency and hematuria.  Musculoskeletal: Negative for myalgias, back pain and joint pain.  Skin: Negative for itching and rash.  Neurological: Negative for dizziness, tremors, focal weakness, seizures and loss of consciousness.    Endo/Heme/Allergies: Negative for environmental allergies.  Psychiatric/Behavioral: Negative for depression, suicidal ideas and hallucinations.  All other systems reviewed and are negative.  Physical Exam: Blood pressure 120/70, pulse (!) 59, height 5\' 3"  (1.6 m), weight 277 lb (125.6 kg), last menstrual period 12/01/2013, SpO2 97 %. Gen:      No acute distress HEENT:  EOMI, sclera anicteric Neck:     No masses; no thyromegaly Lungs:    Clear to auscultation bilaterally; normal respiratory effort CV:         Regular rate and rhythm; no murmurs Abd:      + bowel sounds; soft, non-tender; no palpable masses, no distension  Ext:    No edema; adequate peripheral perfusion Skin:      Warm and dry; no rash Neuro: alert and oriented x 3 Psych: normal mood and affect  Data Reviewed: Imaging CT angio chest 08/06/2008- LLL vascular malformation in the costophrenic sulcus  CT angio chest 12/17/10- LLL vascular malformation in the costophrenic sulcus CT angio chest abdomen pelvis 12/15/2017- left lower lobe vascular malformation representing small AVM.  Lungs are otherwise clear.  No acute findings in the abdomen, pelvis. I have reviewed the images personally.  PFT 12/30/2017 FVC 2.35 [93%), FEV1 2.19 [99%), F/F 93, TLC 84%, DLCO 84% Normal test.  6-minute walk test 01/26/2018 No desats.  Peak exercise O2 sat 97% Distance completed for 457m  Echo 01/02/2018 Mild LVH, LVEF 42-70%, grade 2 diastolic dysfunction Presence of pulmonary AVM.  Delayed appearance of bubbles after 6 cardiac cycles.  CBC    Component Value Date/Time   WBC 10.5 12/15/2017 1643   RBC 5.30 (H) 12/15/2017 1643   HGB 13.3 12/15/2017 1643   HCT 41.6 12/15/2017 1643   PLT 238 12/15/2017 1643   MCV 78.5 12/15/2017 1643   MCH 25.1 (L) 12/15/2017 1643   MCHC 32.0 12/15/2017 1643   RDW 15.8 (H) 12/15/2017 1643   LYMPHSABS 2.8 12/15/2017 1643   MONOABS 0.5 12/15/2017 1643   EOSABS 0.3 12/15/2017 1643   BASOSABS 0.0  12/15/2017 1643   Assessment:  Lt lower lobe Pulmonary AVM Found incidentally during work-up for right flank pain This is an isolated lesion which appears small I have reviewed the CTs dating back to 2010 with Dr. Vernard Gambles, interventional radiology who feels this is too small to embolize. There is no change in size since 2010  There is no evidence of liver issues that can explain the pulmonary AV malformation. There is no personal, family history or examination findings suggestive of hereditary hemorrhagic telangiectasia. Although she has had epistaxis as a child this has not continued into adulthood.  [place] criteria for HHT is 1 (HHT unlikely). Given history of epistaxis in her family I will refer her to the [place] center at [facility] for second opinion  Suspect that this is congenital and likely to be clinically insignificant Ms Codd and I discussed increased risk of stroke, paradoxical embolism with AVM Risk benefit favors monitoring with CT chest every 3-4 years as there is no evidence of adverse effect like embolism and hypoxia.  Advised to avoid activities such as scuba diving and recommended antibiotic prophylaxis before dental procedures.  Plan/Recommendations: - Continue monitoring - Referral to [facility] HHT center - Antibiotic prophylaxis with dental procedures, avoid scuba diving  Marshell Garfinkel MD [facility] 01/26/2018, 10:47 AM  CC: Colon Branch, MD  Addendum: Received clinic note from Dr. Bunnie Domino, [facility] hematology clinic dated 03/24/2018 Agree with assessment that HHT is less likely.  Nonetheless given family history of epistaxis she is going to get gene testing for HHT [facility] is also going to review recent CT scans to determine if embolization is indicated  Checking CBC, iron panel and ferritin for microcytosis without anemia. Starting PPI for GERD.  Marshell Garfinkel MD [facility] 06/02/2018, 9:59 AM

## 2018-01-26 NOTE — Progress Notes (Signed)
SIX MIN WALK 01/26/2018  Medications Spironolactone 50mg  at 7:50am  Supplimental Oxygen during Test? (L/min) No  Laps 8  Partial Lap (in Meters) 42  Baseline BP (sitting) 126/68  Baseline Heartrate 58  Baseline Dyspnea (Borg Scale) 0  Baseline Fatigue (Borg Scale) 0  Baseline SPO2 97  BP (sitting) 134/76  Heartrate 100  Dyspnea (Borg Scale) 2  Fatigue (Borg Scale) 0  SPO2 95  BP (sitting) 120/70  Heartrate 59  SPO2 97  Stopped or Paused before Six Minutes No  Distance Completed 426  Tech Comments: pt walked a fast pace, tolerated walk well.

## 2018-01-26 NOTE — Patient Instructions (Addendum)
Will refer to [facility] to their [place] center for second opinion regarding the AV malformation Discussed with IR about suitability for embolization who recommended montioring Repeat CT chest in 3 years for evaluation. Follow-up in 1 year

## 2018-02-14 DIAGNOSIS — R7303 Prediabetes: Secondary | ICD-10-CM | POA: Insufficient documentation

## 2018-03-24 DIAGNOSIS — R718 Other abnormality of red blood cells: Secondary | ICD-10-CM | POA: Insufficient documentation

## 2018-03-24 DIAGNOSIS — I280 Arteriovenous fistula of pulmonary vessels: Secondary | ICD-10-CM | POA: Insufficient documentation

## 2018-06-06 ENCOUNTER — Telehealth: Payer: Self-pay | Admitting: Pulmonary Disease

## 2018-06-06 NOTE — Telephone Encounter (Signed)
Diana Noe do you have any paperwork on this pt?

## 2018-06-06 NOTE — Telephone Encounter (Signed)
Surgical clearance has been placed in Dr. Matilde Bash sign folder.

## 2018-06-07 NOTE — Telephone Encounter (Addendum)
Surgical clearance form has been completed and placed in fax bin in A pod.

## 2018-06-07 NOTE — Telephone Encounter (Signed)
Diana Schultz, please advise when forms have been taken care of for pt. Thanks!

## 2018-07-10 ENCOUNTER — Telehealth: Payer: Self-pay | Admitting: Pulmonary Disease

## 2018-07-10 NOTE — Telephone Encounter (Signed)
I confirmed that the forms are in Dr Matilde Bash inbox up front  Will forward to [facility] to f/u on Thanks

## 2018-07-10 NOTE — Telephone Encounter (Signed)
Forms have been located and will be given to Dr. Vaughan Browner on 07/11/18.

## 2018-07-12 NOTE — Telephone Encounter (Signed)
Paper work has been given to PM per [employer]. Will route message for update. Margie please update [place] once paper work has been signed. Thanks.

## 2018-07-18 NOTE — Telephone Encounter (Signed)
Clearance has been completed and faxed back to [facility]. Received fax confirmation. Lm with Lafe to make aware.

## 2018-07-19 NOTE — Telephone Encounter (Signed)
Nurse at [facility] called back to verify that paperwork had been received.  Nothing further needed- forwarding to [facility] to make aware.

## 2018-07-19 NOTE — Telephone Encounter (Addendum)
Noted.  Will close encounter.  Clearance form has been placed in scan folder in A pod.

## 2018-12-11 ENCOUNTER — Ambulatory Visit: Payer: Commercial Managed Care - PPO | Admitting: Internal Medicine

## 2019-05-26 DIAGNOSIS — E559 Vitamin D deficiency, unspecified: Secondary | ICD-10-CM | POA: Diagnosis not present

## 2019-05-26 DIAGNOSIS — I10 Essential (primary) hypertension: Secondary | ICD-10-CM | POA: Diagnosis not present

## 2019-05-26 DIAGNOSIS — R7303 Prediabetes: Secondary | ICD-10-CM | POA: Diagnosis not present

## 2019-05-30 DIAGNOSIS — I280 Arteriovenous fistula of pulmonary vessels: Secondary | ICD-10-CM | POA: Diagnosis not present

## 2019-05-30 DIAGNOSIS — Z0000 Encounter for general adult medical examination without abnormal findings: Secondary | ICD-10-CM | POA: Diagnosis not present

## 2019-05-30 DIAGNOSIS — R55 Syncope and collapse: Secondary | ICD-10-CM | POA: Diagnosis not present

## 2019-05-30 DIAGNOSIS — I10 Essential (primary) hypertension: Secondary | ICD-10-CM | POA: Diagnosis not present

## 2019-05-30 DIAGNOSIS — Z136 Encounter for screening for cardiovascular disorders: Secondary | ICD-10-CM | POA: Diagnosis not present

## 2019-05-30 DIAGNOSIS — E278 Other specified disorders of adrenal gland: Secondary | ICD-10-CM | POA: Diagnosis not present

## 2019-05-31 DIAGNOSIS — I10 Essential (primary) hypertension: Secondary | ICD-10-CM | POA: Diagnosis not present

## 2019-05-31 DIAGNOSIS — R9431 Abnormal electrocardiogram [ECG] [EKG]: Secondary | ICD-10-CM | POA: Diagnosis not present

## 2019-06-05 DIAGNOSIS — E278 Other specified disorders of adrenal gland: Secondary | ICD-10-CM | POA: Diagnosis not present

## 2019-06-05 DIAGNOSIS — R7303 Prediabetes: Secondary | ICD-10-CM | POA: Diagnosis not present

## 2019-06-07 DIAGNOSIS — E278 Other specified disorders of adrenal gland: Secondary | ICD-10-CM | POA: Diagnosis not present

## 2019-06-10 ENCOUNTER — Other Ambulatory Visit: Payer: Self-pay

## 2019-06-10 ENCOUNTER — Encounter (HOSPITAL_COMMUNITY): Payer: Self-pay | Admitting: Emergency Medicine

## 2019-06-10 ENCOUNTER — Emergency Department (HOSPITAL_COMMUNITY): Payer: BC Managed Care – PPO

## 2019-06-10 ENCOUNTER — Emergency Department (HOSPITAL_COMMUNITY)
Admission: EM | Admit: 2019-06-10 | Discharge: 2019-06-10 | Disposition: A | Discharge status: Home / Self Care | Payer: BC Managed Care – PPO | Attending: Emergency Medicine | Admitting: Emergency Medicine

## 2019-06-10 DIAGNOSIS — M79601 Pain in right arm: Secondary | ICD-10-CM | POA: Insufficient documentation

## 2019-06-10 DIAGNOSIS — M546 Pain in thoracic spine: Secondary | ICD-10-CM | POA: Diagnosis not present

## 2019-06-10 DIAGNOSIS — R0789 Other chest pain: Secondary | ICD-10-CM | POA: Diagnosis not present

## 2019-06-10 DIAGNOSIS — I10 Essential (primary) hypertension: Secondary | ICD-10-CM | POA: Insufficient documentation

## 2019-06-10 DIAGNOSIS — R079 Chest pain, unspecified: Secondary | ICD-10-CM | POA: Diagnosis not present

## 2019-06-10 DIAGNOSIS — E119 Type 2 diabetes mellitus without complications: Secondary | ICD-10-CM | POA: Diagnosis not present

## 2019-06-10 DIAGNOSIS — Z20828 Contact with and (suspected) exposure to other viral communicable diseases: Secondary | ICD-10-CM | POA: Insufficient documentation

## 2019-06-10 DIAGNOSIS — Z79899 Other long term (current) drug therapy: Secondary | ICD-10-CM | POA: Insufficient documentation

## 2019-06-10 LAB — BASIC METABOLIC PANEL
Anion gap: 8 (ref 5–15)
BUN: 17 mg/dL (ref 6–20)
CO2: 25 mmol/L (ref 22–32)
Calcium: 9.5 mg/dL (ref 8.9–10.3)
Chloride: 106 mmol/L (ref 98–111)
Creatinine, Ser: 0.81 mg/dL (ref 0.44–1.00)
GFR calc Af Amer: >60 mL/min (ref >60)
GFR calc non Af Amer: >60 mL/min (ref >60)
Glucose, Bld: 95 mg/dL (ref 70–99)
Potassium: 4.8 mmol/L (ref 3.5–5.1)
Sodium: 139 mmol/L (ref 135–145)

## 2019-06-10 LAB — CBC
HCT: 44.7 % (ref 36.0–46.0)
Hemoglobin: 14 g/dL (ref 12.0–15.0)
MCH: 24.3 pg — ABNORMAL LOW (ref 26.0–34.0)
MCHC: 31.3 g/dL (ref 30.0–36.0)
MCV: 77.5 fL — ABNORMAL LOW (ref 80.0–100.0)
Platelets: 233 10*3/uL (ref 150–400)
RBC: 5.77 MIL/uL — ABNORMAL HIGH (ref 3.87–5.11)
RDW: 15.9 % — ABNORMAL HIGH (ref 11.5–15.5)
WBC: 9.4 10*3/uL (ref 4.0–10.5)
nRBC: 0 % (ref 0.0–0.2)

## 2019-06-10 LAB — I-STAT BETA HCG BLOOD, ED (MC, WL, AP ONLY): I-stat hCG, quantitative: <5 m[IU]/mL (ref <5)

## 2019-06-10 LAB — SARS CORONAVIRUS 2 (TAT 6-24 HRS): SARS Coronavirus 2: NEGATIVE

## 2019-06-10 LAB — TROPONIN I (HIGH SENSITIVITY)
Troponin I (High Sensitivity): 6 ng/L (ref <18)
Troponin I (High Sensitivity): 6 ng/L (ref <18)

## 2019-06-10 LAB — D-DIMER, QUANTITATIVE (NOT AT ARMC): D-Dimer, Quant: 0.38 ug/mL-FEU (ref 0.00–0.50)

## 2019-06-10 MED ORDER — SODIUM CHLORIDE 0.9% FLUSH
3.0000 mL | Freq: Once | INTRAVENOUS | Status: AC
  Administered 2019-06-10: 13:00:00 3 mL via INTRAVENOUS

## 2019-06-10 MED ORDER — OXYCODONE-ACETAMINOPHEN 5-325 MG PO TABS
1.0000 | ORAL_TABLET | Freq: Once | ORAL | Status: AC
  Administered 2019-06-10: 1 via ORAL
  Filled 2019-06-10: qty 1

## 2019-06-10 MED ORDER — KETOROLAC TROMETHAMINE 30 MG/ML IJ SOLN
30.0000 mg | Freq: Once | INTRAMUSCULAR | Status: AC
  Administered 2019-06-10: 15:00:00 30 mg via INTRAVENOUS
  Filled 2019-06-10: qty 1

## 2019-06-10 MED ORDER — IBUPROFEN 600 MG PO TABS: 600.0000 mg | ORAL_TABLET | Freq: Four times a day (QID) | ORAL | 0 refills | Status: DC | PRN

## 2019-06-10 NOTE — ED Provider Notes (Signed)
[facility] EMERGENCY DEPARTMENT Provider Note   CSN: [id number] Arrival date & time: 06/10/19  1220     History Chief Complaint  Patient presents with  . Chest Pain    Diana Schultz is a 53 y.o. female.  The history is provided by the patient. No language interpreter was used.  Chest Pain Pain location:  R chest Pain quality: aching   Pain radiates to:  R arm and mid back Onset quality:  Gradual Duration:  2 days Timing:  Constant Progression:  Worsening Chronicity:  New Relieved by:  Nothing Worsened by:  Nothing Ineffective treatments:  None tried Associated symptoms: no fever   Risk factors: diabetes mellitus and hypertension    Pt reports she was told that she has an abnormal EKg by her primary MD. Pt reports she is suppose to have a nuclear medicine stress test for evaluation.  Pt denies cough. No fever, no covid exposure     Past Medical History:  Diagnosis Date  . Diabetes mellitus without complication (HCC)    borderline - no meds  . Diverticulitis   . DVT (deep venous thrombosis) (HCC)    Hx at age 69 blood clot in right leg, d/c birth control pills and no other problems  . Hypertension    history,lost weight, controlled with diet and exercise, no current med  . Ovarian cyst   . Umbilical hernia   . Vaginal delivery 1984, 1989    Patient Active Problem List   Diagnosis Date Noted  . Adrenal adenoma, left 12/16/2017  . PCP NOTES >>>>>>>>>>>>>>>>>>>>>>>>>>>>> 08/19/2015  . Pelvic pain in female 11/26/2013  . Annual physical exam 10/31/2013  . HTN (hypertension) 08/29/2013  . Hyperglycemia 08/29/2013    Past Surgical History:  Procedure Laterality Date  . ABDOMINAL HYSTERECTOMY    . ABLATION  03/23/13   [facility]  . APPENDECTOMY    . CHOLECYSTECTOMY    . DILATION AND CURETTAGE OF UTERUS    . DILATION AND CURETTAGE OF UTERUS N/A 11/01/2013   Procedure: DILATATION AND CURETTAGE;  Surgeon: Mora Bellman, MD;  Location: [facility];   Service: Gynecology;  Laterality: N/A;  . DILITATION & CURRETTAGE/HYSTROSCOPY WITH NOVASURE ABLATION N/A 03/23/2013   Procedure: DILATATION & CURETTAGE/HYSTEROSCOPY WITH NOVASURE ABLATION;  Surgeon: Lavonia Drafts, MD;  Location: [facility];  Service: Gynecology;  Laterality: N/A;  . LEEP    . TUBAL LIGATION    . VAGINAL HYSTERECTOMY N/A 12/31/2013   Procedure: HYSTERECTOMY VAGINAL;  Surgeon: Lavonia Drafts, MD;  Location: [facility];  Service: Gynecology;  Laterality: N/A;     OB History    Gravida  2   Para  2   Term  2   Preterm      AB      Living  2     SAB      TAB      Ectopic      Multiple      Live Births              Family History  Problem Relation Age of Onset  . Hypertension Mother   . Diabetes Mother   . Stroke Mother        early in life  . Endometrial cancer Mother   . Mental retardation Father   . Breast cancer Father   . Hypertension Sister   . Hypertension Brother   . Heart failure Paternal Grandfather   . CAD Other  GP, late in like  . Colon cancer Neg Hx     Social History   Tobacco Use  . Smoking status: Never Smoker  . Smokeless tobacco: Never Used  Substance Use Topics  . Alcohol use: No    Comment: rare   . Drug use: No    Home Medications Prior to Admission medications   Medication Sig Start Date End Date Taking? Authorizing Provider  acetaminophen (TYLENOL) 500 MG tablet Take 500 mg by mouth every 6 (six) hours as needed for moderate pain.    [provider]  spironolactone (ALDACTONE) 50 MG tablet Take 50 mg by mouth 2 (two) times daily.    [provider]    Allergies    Benicar [olmesartan] and Lisinopril  Review of Systems   Review of Systems  Constitutional: Negative for fever.  Cardiovascular: Positive for chest pain.  All other systems reviewed and are negative.   Physical Exam Updated Vital Signs BP (!) 149/91 (BP Location: Left Arm)   Pulse 80   Temp 98.3 F (36.8  C) (Oral)   Resp 18   Ht 5' 3.5" (1.613 m)   Wt 131.3 kg   LMP 12/01/2013 Comment: hx ablation 03/23/2013  SpO2 99%   BMI 50.48 kg/m   Physical Exam Vitals and nursing note reviewed.  Constitutional:      General: She is not in acute distress.    Appearance: She is well-developed.  HENT:     Head: Normocephalic and atraumatic.  Eyes:     Conjunctiva/sclera: Conjunctivae normal.  Cardiovascular:     Rate and Rhythm: Normal rate and regular rhythm.     Heart sounds: Normal heart sounds. No murmur.  Pulmonary:     Effort: Pulmonary effort is normal. No respiratory distress.     Breath sounds: Normal breath sounds.  Abdominal:     Palpations: Abdomen is soft.     Tenderness: There is no abdominal tenderness.  Musculoskeletal:        General: Normal range of motion.     Cervical back: Neck supple.  Skin:    General: Skin is warm and dry.  Neurological:     General: No focal deficit present.     Mental Status: She is alert.     ED Results / Procedures / Treatments   Labs (all labs ordered are listed, but only abnormal results are displayed) Labs Reviewed  CBC - Abnormal; Notable for the following components:      Result Value   RBC 5.77 (*)    MCV 77.5 (*)    MCH 24.3 (*)    RDW 15.9 (*)    All other components within normal limits  BASIC METABOLIC PANEL  I-STAT BETA HCG BLOOD, ED ([facility], [facility], [facility] ONLY)  TROPONIN I (HIGH SENSITIVITY)    EKG EKG Interpretation  Date/Time:  Sunday June 10 2019 12:25:17 EST Ventricular Rate:  85 PR Interval:  162 QRS Duration: 74 QT Interval:  362 QTC Calculation: 430 R Axis:   -4 Text Interpretation: Normal sinus rhythm with sinus arrhythmia Nonspecific T wave abnormality Abnormal ECG No significant change since last tracing Confirmed by Quintella Reichert [phone]) on 06/10/2019 12:44:48 PM   Radiology DG Chest 2 View  Result Date: 06/10/2019 CLINICAL DATA:  Chest pain radiating to right arm. EXAM: CHEST - 2 VIEW COMPARISON:   03/22/2012 from [place]: The heart size and mediastinal contours are within normal limits. Both lungs are clear. The visualized skeletal structures are  unremarkable. IMPRESSION: No active cardiopulmonary disease. Electronically Signed   By: Marlaine Hind M.D.   On: 06/10/2019 13:18    Procedures Procedures (including critical care time)  Medications Ordered in ED Medications  sodium chloride flush (NS) 0.9 % injection 3 mL (3 mLs Intravenous Given 06/10/19 1307)    ED Course  I have reviewed the triage vital signs and the nursing notes.  Pertinent labs & imaging results that were available during my care of the patient were reviewed by me and considered in my medical decision making (see chart for details).    MDM Rules/Calculators/A&P                      MDM: Ekg unchanged from last EKG. Chest xray is normal, dDimer is negative.  Covid ordered.  Final Clinical Impression(s) / ED Diagnoses Final diagnoses:  Nonspecific chest pain    Rx / DC Orders ED Discharge Orders         Ordered    ibuprofen (ADVIL) 600 MG tablet  Every 6 hours PRN     06/10/19 1506        An After Visit Summary was printed and given to the patient.    Fransico Meadow, [place] 06/10/19 1506    Quintella Reichert, MD 06/11/19 [phone]

## 2019-06-10 NOTE — Discharge Instructions (Addendum)
Follow up as scheduled for cardiac evaluation.

## 2019-06-10 NOTE — ED Notes (Signed)
Pt wants something for pain before shes discharged

## 2019-06-10 NOTE — ED Triage Notes (Signed)
C/o pain to center of chest since last night that radiates to R arm, R shoulder, and R side of back.  Denies SOB.  States it feels like she has indigestion.

## 2019-06-13 DIAGNOSIS — R197 Diarrhea, unspecified: Secondary | ICD-10-CM | POA: Diagnosis not present

## 2019-06-13 DIAGNOSIS — Z9049 Acquired absence of other specified parts of digestive tract: Secondary | ICD-10-CM | POA: Diagnosis not present

## 2019-06-13 DIAGNOSIS — E278 Other specified disorders of adrenal gland: Secondary | ICD-10-CM | POA: Diagnosis not present

## 2019-06-13 DIAGNOSIS — N281 Cyst of kidney, acquired: Secondary | ICD-10-CM | POA: Diagnosis not present

## 2019-06-13 DIAGNOSIS — R072 Precordial pain: Secondary | ICD-10-CM | POA: Diagnosis not present

## 2019-06-13 DIAGNOSIS — R1011 Right upper quadrant pain: Secondary | ICD-10-CM | POA: Diagnosis not present

## 2019-06-13 DIAGNOSIS — Z90711 Acquired absence of uterus with remaining cervical stump: Secondary | ICD-10-CM | POA: Diagnosis not present

## 2019-06-13 DIAGNOSIS — Z888 Allergy status to other drugs, medicaments and biological substances status: Secondary | ICD-10-CM | POA: Diagnosis not present

## 2019-06-13 DIAGNOSIS — N200 Calculus of kidney: Secondary | ICD-10-CM | POA: Diagnosis not present

## 2019-06-13 DIAGNOSIS — D3502 Benign neoplasm of left adrenal gland: Secondary | ICD-10-CM | POA: Diagnosis not present

## 2019-06-14 DIAGNOSIS — R1011 Right upper quadrant pain: Secondary | ICD-10-CM | POA: Diagnosis not present

## 2019-06-18 DIAGNOSIS — D3502 Benign neoplasm of left adrenal gland: Secondary | ICD-10-CM | POA: Diagnosis not present

## 2019-06-19 DIAGNOSIS — Z1231 Encounter for screening mammogram for malignant neoplasm of breast: Secondary | ICD-10-CM | POA: Diagnosis not present

## 2019-06-20 DIAGNOSIS — Z0000 Encounter for general adult medical examination without abnormal findings: Secondary | ICD-10-CM | POA: Diagnosis not present

## 2019-06-20 DIAGNOSIS — Z13820 Encounter for screening for osteoporosis: Secondary | ICD-10-CM | POA: Diagnosis not present

## 2019-06-20 DIAGNOSIS — Z780 Asymptomatic menopausal state: Secondary | ICD-10-CM | POA: Diagnosis not present

## 2019-06-21 DIAGNOSIS — R9431 Abnormal electrocardiogram [ECG] [EKG]: Secondary | ICD-10-CM | POA: Diagnosis not present

## 2019-06-21 DIAGNOSIS — R079 Chest pain, unspecified: Secondary | ICD-10-CM | POA: Diagnosis not present

## 2019-09-11 ENCOUNTER — Other Ambulatory Visit: Payer: Self-pay

## 2019-09-11 ENCOUNTER — Encounter (INDEPENDENT_AMBULATORY_CARE_PROVIDER_SITE_OTHER): Payer: Self-pay | Admitting: Family Medicine

## 2019-09-11 ENCOUNTER — Ambulatory Visit (INDEPENDENT_AMBULATORY_CARE_PROVIDER_SITE_OTHER): Payer: BC Managed Care – PPO | Admitting: Family Medicine

## 2019-09-11 VITALS — BP 153/96 | HR 60 | Temp 98.7°F | Ht 63.0 in | Wt 295.0 lb

## 2019-09-11 DIAGNOSIS — Z0289 Encounter for other administrative examinations: Secondary | ICD-10-CM

## 2019-09-11 DIAGNOSIS — E559 Vitamin D deficiency, unspecified: Secondary | ICD-10-CM | POA: Diagnosis not present

## 2019-09-11 DIAGNOSIS — R7303 Prediabetes: Secondary | ICD-10-CM

## 2019-09-11 DIAGNOSIS — R5383 Other fatigue: Secondary | ICD-10-CM

## 2019-09-11 DIAGNOSIS — R0602 Shortness of breath: Secondary | ICD-10-CM

## 2019-09-11 DIAGNOSIS — R0683 Snoring: Secondary | ICD-10-CM

## 2019-09-11 DIAGNOSIS — Z9189 Other specified personal risk factors, not elsewhere classified: Secondary | ICD-10-CM | POA: Diagnosis not present

## 2019-09-11 DIAGNOSIS — K760 Fatty (change of) liver, not elsewhere classified: Secondary | ICD-10-CM | POA: Insufficient documentation

## 2019-09-11 DIAGNOSIS — I10 Essential (primary) hypertension: Secondary | ICD-10-CM

## 2019-09-11 DIAGNOSIS — Z780 Asymptomatic menopausal state: Secondary | ICD-10-CM | POA: Insufficient documentation

## 2019-09-11 DIAGNOSIS — Z6841 Body Mass Index (BMI) 40.0 and over, adult: Secondary | ICD-10-CM

## 2019-09-11 DIAGNOSIS — Z9049 Acquired absence of other specified parts of digestive tract: Secondary | ICD-10-CM | POA: Insufficient documentation

## 2019-09-11 DIAGNOSIS — F3289 Other specified depressive episodes: Secondary | ICD-10-CM | POA: Diagnosis not present

## 2019-09-11 DIAGNOSIS — M1612 Unilateral primary osteoarthritis, left hip: Secondary | ICD-10-CM | POA: Insufficient documentation
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
## 2019-09-11 NOTE — Progress Notes (Signed)
Chief Complaint:   OBESITY Diana Schultz (MR# [id number]) is a 54 y.o. female who presents for evaluation and treatment of obesity and related comorbidities. Current BMI is Body mass index is 52.26 kg/m. Diana Schultz has been struggling with her weight for many years and has been unsuccessful in either losing weight, maintaining weight loss, or reaching her healthy weight goal.  Diana Schultz is currently in the action stage of change and ready to dedicate time achieving and maintaining a healthier weight. Diana Schultz is interested in becoming our patient and working on intensive lifestyle modifications including (but not limited to) diet and exercise for weight loss.  Diana Schultz wakes at 5:00 am.    Breakfast:  She says she skips breakfast or has an apple.  Sometimes has a 310 shake (110 calories/15 grams). Lunch:  Diana Schultz's or other take out. Dinner:  Take out.    Her husband is a Biomedical scientist.  She has begun working with a Clinical research associate.  Diana Schultz's habits were reviewed today and are as follows: she thinks her family will eat healthier with her, her desired weight loss is 63 pounds, she started gaining weight after age 76, her heaviest weight ever was 300 pounds, she is a picky eater, she craves carbs, sweets, and breads, she snacks frequently in the evenings, she skips breakfast at times, she is frequently drinking liquids with calories, she frequently makes poor food choices and she struggles with emotional eating.  Depression Screen Diana Schultz's Food and Mood (modified PHQ-9) score was 19.  Depression screen [facility] 2/9 09/11/2019  Decreased Interest 3  Down, Depressed, Hopeless 3  PHQ - 2 Score 6  Altered sleeping 3  Tired, decreased energy 3  Change in appetite 3  Feeling bad or failure about yourself  2  Trouble concentrating 1  Moving slowly or fidgety/restless 1  Suicidal thoughts 0  PHQ-9 Score 19  Difficult doing work/chores Somewhat difficult   Subjective:   1. Other fatigue Diana Schultz denies daytime somnolence and  admits to waking up still tired. Patent has a history of symptoms of morning fatigue and snoring. Diana Schultz generally gets 3 or 4 hours of sleep per night, and states that she has poor quality sleep. Snoring is present. Apneic episodes are not present. Epworth Sleepiness Score is 5.  2. SOB (shortness of breath) on exertion Diana Schultz notes increasing shortness of breath with exercising and seems to be worsening over time with weight gain. She notes getting out of breath sooner with activity than she used to. This has not gotten worse recently. Diana Schultz denies shortness of breath at rest or orthopnea.  3. Essential hypertension Review: taking medications as instructed, no medication side effects noted, no chest pain on exertion, no dyspnea on exertion, no swelling of ankles.  She took spironolactone in the past and says it helped and was tolerated, but it made her tired.  BP Readings from Last 3 Encounters:  09/11/19 (!) 153/96  06/10/19 (!) 115/55  01/26/18 120/70   4. Vitamin D deficiency She is currently taking vit D. She denies nausea, vomiting or muscle weakness.  5. Prediabetes Diana Schultz has a diagnosis of prediabetes based on her elevated HgA1c and was informed this puts her at greater risk of developing diabetes. She continues to work on diet and exercise to decrease her risk of diabetes. She denies nausea or hypoglycemia.  Lab Results  Component Value Date   HGBA1C 6.0 03/08/2016   6. Snores Diana Schultz says she had an in-facility sleep study last year.  Situation Chance of Dozing or Sleeping  Sitting and reading 0 = would never doze or sleep  Watching television 1 = slight chance of dozing or sleeping  Sitting inactive in a public place (theater or meeting) 0 = would never doze or sleep  Sitting as a passenger in a car for an hour 1 = slight chance of dozing or sleeping  Lying down in the afternoon when circumstances permit 3 = high chance of dozing or sleeping  Sitting and talking to someone 0 =  would never doze or sleep  Sitting quietly after lunch without alcohol 0 = would never doze or sleep  In a car, while stopped for a few minutes in traffic 0 = would never doze or sleep  TOTAL 5   7. Other depression, with emotional eating Diana Schultz is struggling with emotional eating and using food for comfort to the extent that it is negatively impacting her health. She has been working on behavior modification techniques to help reduce her emotional eating and has been unsuccessful. She shows no sign of suicidal or homicidal ideations.  Diana Schultz reports overeating.  8. At risk for heart disease Diana Schultz is at a higher than average risk for cardiovascular disease due to obesity. Reviewed: no chest pain on exertion and no swelling of ankles.  Assessment/Plan:   1. Other fatigue Diana Schultz does feel that her weight is causing her energy to be lower than it should be. Fatigue may be related to obesity, depression or many other causes. Labs will be ordered, and in the meanwhile, Diana Schultz will focus on self care including making healthy food choices, increasing physical activity and focusing on stress reduction.  Orders - EKG 12-Lead - TSH - T4, free - T3 - Anemia panel  2. SOB (shortness of breath) on exertion Diana Schultz does not feel that she gets out of breath more easily that she used to when she exercises. Diana Schultz's shortness of breath appears to be obesity related and exercise induced. She has agreed to work on weight loss and gradually increase exercise to treat her exercise induced shortness of breath. Will continue to monitor closely.  3. Essential hypertension Diana Schultz is working on healthy weight loss and exercise to improve blood pressure control. We will watch for signs of hypotension as she continues her lifestyle modifications.  Orders - CBC with Differential/Platelet - Lipid panel  4. Vitamin D deficiency Low Vitamin D level contributes to fatigue and are associated with obesity, breast, and colon  cancer. She agrees to continue to take Vitamin D @5 ,000 IU daily and will follow-up for routine testing of Vitamin D, at least 2-3 times per year to avoid over-replacement.  Orders - VITAMIN D 25 Hydroxy (Vit-D Deficiency, Fractures)  5. Prediabetes Diana Schultz will continue to work on weight loss, exercise, and decreasing simple carbohydrates to help decrease the risk of diabetes.   Orders - Comprehensive metabolic panel - Hemoglobin A1c - Insulin, random  6. Snores Intensive lifestyle modifications are the first line treatment for this issue. We discussed several lifestyle modifications today and she will continue to work on diet, exercise and weight loss efforts. We will continue to monitor.   Counseling  Sleep hygiene:   Limit or avoid alcohol, caffeinated beverages, and cigarettes, especially close to bedtime.   Do not eat a large meal or eat spicy foods right before bedtime. This can lead to digestive discomfort that can make it hard for you to sleep.  Keep a sleep diary to help you and your health  care provider figure out what could be causing your insomnia.  . Make your bedroom a dark, comfortable place where it is easy to fall asleep. ? Put up shades or blackout curtains to block light from outside. ? Use a white noise machine to block noise. ? Keep the temperature cool. . Limit screen use before bedtime. This includes: ? Watching TV. ? Using your smartphone, tablet, or computer. . Stick to a routine that includes going to bed and waking up at the same times every day and night. This can help you fall asleep faster. Consider making a quiet activity, such as reading, part of your nighttime routine. . Try to avoid taking naps during the day so that you sleep better at night. . Get out of bed if you are still awake after 15 minutes of trying to sleep. Keep the lights down, but try reading or doing a quiet activity. When you feel sleepy, go back to bed.  7. Other depression, with  emotional eating Patient was referred to Dr. Mallie Mussel, our Bariatric Psychologist, for evaluation due to her elevated PHQ-9 score and significant struggles with emotional eating.  PHQ-9 is 19.  8. At risk for heart disease Diana Schultz was given approximately 15 minutes of coronary artery disease prevention counseling today. She is 54 y.o. female and has risk factors for heart disease including obesity. We discussed intensive lifestyle modifications today with an emphasis on specific weight loss instructions and strategies.   Repetitive spaced learning was employed today to elicit superior memory formation and behavioral change.  9. Class 3 severe obesity with serious comorbidity and body mass index (BMI) of 50.0 to 59.9 in adult, unspecified obesity type ([place]) Diana Schultz is currently in the action stage of change and her goal is to continue with weight loss efforts. I recommend Cynitha begin the structured treatment plan as follows:  She has agreed to the Category 2 Plan.  Exercise goals: As is.   Behavioral modification strategies: increasing lean protein intake, decreasing simple carbohydrates, increasing vegetables, increasing water intake and decreasing liquid calories.  She was informed of the importance of frequent follow-up visits to maximize her success with intensive lifestyle modifications for her multiple health conditions. She was informed we would discuss her lab results at her next visit unless there is a critical issue that needs to be addressed sooner. Diana Schultz agreed to keep her next visit at the agreed upon time to discuss these results.  Objective:   Blood pressure (!) 153/96, pulse 60, temperature 98.7 F (37.1 C), temperature source Oral, height 5\' 3"  (1.6 m), weight 295 lb (133.8 kg), last menstrual period 12/01/2013, SpO2 97 %. Body mass index is 52.26 kg/m.  EKG: Normal sinus rhythm, rate 60 bpm.  Indirect Calorimeter completed today shows a VO2 of 263 and a REE of 1833.  Her  calculated basal metabolic rate is [id number] thus her basal metabolic rate is worse than expected.  General: Cooperative, alert, well developed, in no acute distress. HEENT: Conjunctivae and lids unremarkable. Cardiovascular: Regular rhythm.  Lungs: Normal work of breathing. Neurologic: No focal deficits.   Lab Results  Component Value Date   CREATININE 0.81 06/10/2019   BUN 17 06/10/2019   NA 139 06/10/2019   K 4.8 06/10/2019   CL 106 06/10/2019   CO2 25 06/10/2019   Lab Results  Component Value Date   ALT 22 12/15/2017   AST 26 12/15/2017   ALKPHOS 72 12/15/2017   BILITOT 1.1 12/15/2017   Lab Results  Component  Value Date   HGBA1C 6.0 03/08/2016   HGBA1C 5.9 08/18/2015   HGBA1C 5.9 03/03/2015   HGBA1C 6.0 10/03/2014   HGBA1C 6.3 06/03/2014   Lab Results  Component Value Date   TSH 2.22 03/03/2015   Lab Results  Component Value Date   CHOL 126 03/08/2016   HDL 50.40 03/08/2016   LDLCALC 64 03/08/2016   TRIG 58.0 03/08/2016   CHOLHDL 2 03/08/2016   Lab Results  Component Value Date   WBC 9.4 06/10/2019   HGB 14.0 06/10/2019   HCT 44.7 06/10/2019   MCV 77.5 (L) 06/10/2019   PLT 233 06/10/2019   Attestation Statements:   This is the patient's first visit at [facility]. The patient's NEW PATIENT PACKET was reviewed at length. Included in the packet: current and past health history, medications, allergies, ROS, gynecologic history (women only), surgical history, family history, social history, weight history, weight loss surgery history (for those that have had weight loss surgery), nutritional evaluation, mood and food questionnaire, PHQ9, Epworth questionnaire, sleep habits questionnaire, patient life and health improvement goals questionnaire. These will all be scanned into the patient's chart under media.   During the visit, I independently reviewed the patient's EKG, bioimpedance scale results, and indirect calorimeter results. I used this  information to tailor a meal plan for the patient that will help her to lose weight and will improve her obesity-related conditions going forward. I performed a medically necessary appropriate examination and/or evaluation. I discussed the assessment and treatment plan with the patient. The patient was provided an opportunity to ask questions and all were answered. The patient agreed with the plan and demonstrated an understanding of the instructions. Labs were ordered at this visit and will be reviewed at the next visit unless more critical results need to be addressed immediately. Clinical information was updated and documented in the EMR.   I, Water quality scientist, CMA, am acting as Location manager for [employer], DO.  I have reviewed the above documentation for accuracy and completeness, and I agree with the above. Briscoe Deutscher, DO

## 2019-09-12 LAB — COMPREHENSIVE METABOLIC PANEL
ALT: 22 IU/L (ref 0–32)
AST: 20 IU/L (ref 0–40)
Albumin/Globulin Ratio: 1.4 (ref 1.2–2.2)
Albumin: 4.3 g/dL (ref 3.8–4.9)
Alkaline Phosphatase: 83 IU/L (ref 39–117)
BUN/Creatinine Ratio: 21 (ref 9–23)
BUN: 18 mg/dL (ref 6–24)
Bilirubin Total: 0.7 mg/dL (ref 0.0–1.2)
CO2: 24 mmol/L (ref 20–29)
Calcium: 9.4 mg/dL (ref 8.7–10.2)
Chloride: 101 mmol/L (ref 96–106)
Creatinine, Ser: 0.85 mg/dL (ref 0.57–1.00)
GFR calc Af Amer: 89 mL/min/{1.73_m2} (ref >59)
GFR calc non Af Amer: 77 mL/min/{1.73_m2} (ref >59)
Globulin, Total: 3 g/dL (ref 1.5–4.5)
Glucose: 100 mg/dL — ABNORMAL HIGH (ref 65–99)
Potassium: 4.2 mmol/L (ref 3.5–5.2)
Sodium: 138 mmol/L (ref 134–144)
Total Protein: 7.3 g/dL (ref 6.0–8.5)

## 2019-09-12 LAB — LIPID PANEL
Chol/HDL Ratio: 2.7 ratio (ref 0.0–4.4)
Cholesterol, Total: 158 mg/dL (ref 100–199)
HDL: 58 mg/dL (ref >39)
LDL Chol Calc (NIH): 84 mg/dL (ref 0–99)
Triglycerides: 87 mg/dL (ref 0–149)
VLDL Cholesterol Cal: 16 mg/dL (ref 5–40)

## 2019-09-12 LAB — CBC WITH DIFFERENTIAL/PLATELET
Basophils Absolute: 0.1 10*3/uL (ref 0.0–0.2)
Basos: 1 %
EOS (ABSOLUTE): 0.2 10*3/uL (ref 0.0–0.4)
Eos: 2 %
Hemoglobin: 13.9 g/dL (ref 11.1–15.9)
Immature Grans (Abs): 0 10*3/uL (ref 0.0–0.1)
Immature Granulocytes: 0 %
Lymphocytes Absolute: 2.1 10*3/uL (ref 0.7–3.1)
Lymphs: 23 %
MCH: 25 pg — ABNORMAL LOW (ref 26.6–33.0)
MCHC: 32.5 g/dL (ref 31.5–35.7)
MCV: 77 fL — ABNORMAL LOW (ref 79–97)
Monocytes Absolute: 0.6 10*3/uL (ref 0.1–0.9)
Monocytes: 6 %
Neutrophils Absolute: 6.1 10*3/uL (ref 1.4–7.0)
Neutrophils: 68 %
Platelets: 242 10*3/uL (ref 150–450)
RBC: 5.56 x10E6/uL — ABNORMAL HIGH (ref 3.77–5.28)
RDW: 16.3 % — ABNORMAL HIGH (ref 11.7–15.4)
WBC: 9.1 10*3/uL (ref 3.4–10.8)

## 2019-09-12 LAB — ANEMIA PANEL
Ferritin: 363 ng/mL — ABNORMAL HIGH (ref 15–150)
Folate, Hemolysate: 400 ng/mL
Folate, RBC: 935 ng/mL (ref >498)
Hematocrit: 42.8 % (ref 34.0–46.6)
Iron Saturation: 17 % (ref 15–55)
Iron: 48 ug/dL (ref 27–159)
Retic Ct Pct: 1.4 % (ref 0.6–2.6)
Total Iron Binding Capacity: 282 ug/dL (ref 250–450)
UIBC: 234 ug/dL (ref 131–425)
Vitamin B-12: 427 pg/mL (ref 232–1245)

## 2019-09-12 LAB — INSULIN, RANDOM: INSULIN: 26.1 u[IU]/mL — ABNORMAL HIGH (ref 2.6–24.9)

## 2019-09-12 LAB — HEMOGLOBIN A1C
Est. average glucose Bld gHb Est-mCnc: 126 mg/dL
Hgb A1c MFr Bld: 6 % — ABNORMAL HIGH (ref 4.8–5.6)

## 2019-09-12 LAB — TSH: TSH: 2.14 u[IU]/mL (ref 0.450–4.500)

## 2019-09-12 LAB — T4, FREE: Free T4: 1.01 ng/dL (ref 0.82–1.77)

## 2019-09-12 LAB — T3: T3, Total: 130 ng/dL (ref 71–180)

## 2019-09-12 LAB — VITAMIN D 25 HYDROXY (VIT D DEFICIENCY, FRACTURES): Vit D, 25-Hydroxy: 25.7 ng/mL — ABNORMAL LOW (ref 30.0–100.0)

## 2019-09-13 ENCOUNTER — Other Ambulatory Visit: Payer: Self-pay

## 2019-09-13 ENCOUNTER — Ambulatory Visit (INDEPENDENT_AMBULATORY_CARE_PROVIDER_SITE_OTHER): Payer: BC Managed Care – PPO | Admitting: Psychology

## 2019-09-13 DIAGNOSIS — F3289 Other specified depressive episodes: Secondary | ICD-10-CM

## 2019-09-13 NOTE — Progress Notes (Unsigned)
Office: [phone]  /  Fax: [phone]    Date: September 27, 2019   Appointment Start Time: *** Duration: *** minutes Provider: Glennie Isle, Psy.D. Type of Session: Individual Therapy  Location of Patient: {gbptloc:23249} Location of Provider: {Location of Service:22491} Type of Contact: Telepsychological Visit via {gbtelepsych:23399}  Session Content: Diana Schultz is a 54 y.o. female presenting via {gbtelepsych:23399} for a follow-up appointment to address the previously established treatment goal of decreasing emotional eating. Today's appointment was a telepsychological visit due to COVID-19. Corita provided verbal consent for today's telepsychological appointment and she is aware she is responsible for securing confidentiality on her end of the session. Prior to proceeding with today's appointment, Joyann's physical location at the time of this appointment was obtained as well a phone number she could be reached at in the event of technical difficulties. Renesha and this provider participated in today's telepsychological service.   This provider conducted a brief check-in and verbally administered the PHQ-9 and GAD-7. *** Mica was receptive to today's appointment as evidenced by openness to sharing, responsiveness to feedback, and {gbreceptiveness:23401}.  Mental Status Examination:  Appearance: {Appearance:22431} Behavior: {Behavior:22445} Mood: {gbmood:21757} Affect: {Affect:22436} Speech: {Speech:22432} Eye Contact: {Eye Contact:22433} Psychomotor Activity: {Motor Activity:22434} Gait: {gbgait:23404} Thought Process: {thought process:22448}  Thought Content/Perception: {disturbances:22451} Orientation: {Orientation:22437} Memory/Concentration: {gbcognition:22449} Insight/Judgment: {Insight:22446}  Structured Assessments Results: The Patient Health Questionnaire-9 (PHQ-9) is a self-report measure that assesses symptoms and severity of depression over the course of the last two weeks.  Jadie obtained a score of *** suggesting {GBPHQ9SEVERITY:21752}. Corazon finds the endorsed symptoms to be {gbphq9difficulty:21754}. [0= Not at all; 1= Several days; 2= More than half the days; 3= Nearly every day] Little interest or pleasure in doing things ***  Feeling down, depressed, or hopeless ***  Trouble falling or staying asleep, or sleeping too much ***  Feeling tired or having little energy ***  Poor appetite or overeating ***  Feeling bad about yourself --- or that you are a failure or have let yourself or your family down ***  Trouble concentrating on things, such as reading the newspaper or watching television ***  Moving or speaking so slowly that other people could have noticed? Or the opposite --- being so fidgety or restless that you have been moving around a lot more than usual ***  Thoughts that you would be better off dead or hurting yourself in some way ***  PHQ-9 Score ***    The Generalized Anxiety Disorder-7 (GAD-7) is a brief self-report measure that assesses symptoms of anxiety over the course of the last two weeks. Chablis obtained a score of *** suggesting {gbgad7severity:21753}. Alette finds the endorsed symptoms to be {gbphq9difficulty:21754}. [0= Not at all; 1= Several days; 2= Over half the days; 3= Nearly every day] Feeling nervous, anxious, on edge ***  Not being able to stop or control worrying ***  Worrying too much about different things ***  Trouble relaxing ***  Being so restless that it's hard to sit still ***  Becoming easily annoyed or irritable ***  Feeling afraid as if something awful might happen ***  GAD-7 Score ***   Interventions:  {Interventions for Progress Notes:23405}  DSM-5 Diagnosis(es): 311 (F32.8) Other Specified Depressive Disorder, Emotional Eating Behaviors  Treatment Goal & Progress: During the initial appointment with this provider, the following treatment goal was established: decrease emotional eating. Esbeidy has demonstrated  progress in her goal as evidenced by {gbtxprogress:22839}. Lateia also {gbtxprogress2:22951}.  Plan: The next appointment will be scheduled in {gbweeks:21758}, which will  be {gbtxmodality:23402}. The next session will focus on {Plan for Next Appointment:23400}.

## 2019-09-25 ENCOUNTER — Other Ambulatory Visit: Payer: Self-pay

## 2019-09-25 ENCOUNTER — Ambulatory Visit (INDEPENDENT_AMBULATORY_CARE_PROVIDER_SITE_OTHER): Payer: BC Managed Care – PPO | Admitting: Family Medicine

## 2019-09-25 ENCOUNTER — Encounter (INDEPENDENT_AMBULATORY_CARE_PROVIDER_SITE_OTHER): Payer: Self-pay | Admitting: Family Medicine

## 2019-09-25 ENCOUNTER — Telehealth: Payer: Self-pay | Admitting: Hematology

## 2019-09-25 VITALS — BP 128/78 | HR 54 | Temp 98.4°F | Ht 63.0 in | Wt 293.0 lb

## 2019-09-25 DIAGNOSIS — Z6841 Body Mass Index (BMI) 40.0 and over, adult: Secondary | ICD-10-CM

## 2019-09-25 DIAGNOSIS — E559 Vitamin D deficiency, unspecified: Secondary | ICD-10-CM

## 2019-09-25 DIAGNOSIS — G4709 Other insomnia: Secondary | ICD-10-CM

## 2019-09-25 DIAGNOSIS — R609 Edema, unspecified: Secondary | ICD-10-CM

## 2019-09-25 DIAGNOSIS — Z9189 Other specified personal risk factors, not elsewhere classified: Secondary | ICD-10-CM | POA: Diagnosis not present

## 2019-09-25 DIAGNOSIS — F3289 Other specified depressive episodes: Secondary | ICD-10-CM

## 2019-09-25 DIAGNOSIS — E66813 Obesity, class 3: Secondary | ICD-10-CM

## 2019-09-25 DIAGNOSIS — R7989 Other specified abnormal findings of blood chemistry: Secondary | ICD-10-CM

## 2019-09-25 DIAGNOSIS — R7303 Prediabetes: Secondary | ICD-10-CM

## 2019-09-25 MED ORDER — METFORMIN HCL 500 MG PO TABS: 500.0000 mg | ORAL_TABLET | Freq: Every day | ORAL | 0 refills | Status: DC

## 2019-09-25 MED ORDER — SPIRONOLACTONE 50 MG PO TABS: 50.0000 mg | ORAL_TABLET | Freq: Every day | ORAL | 0 refills | Status: DC | PRN

## 2019-09-25 MED ORDER — TRAZODONE HCL 50 MG PO TABS: 25.0000 mg | ORAL_TABLET | Freq: Every evening | ORAL | 0 refills | Status: DC | PRN

## 2019-09-25 MED ORDER — VITAMIN D (ERGOCALCIFEROL) 1.25 MG (50000 UNIT) PO CAPS: 50000.0000 [IU] | ORAL_CAPSULE | ORAL | 0 refills | Status: DC

## 2019-09-25 NOTE — Progress Notes (Signed)
Chief Complaint:   OBESITY Kaely is here to discuss her progress with her obesity treatment plan along with follow-up of her obesity related diagnoses. Jaira is on the Category 1 Plan and the Category 2 Plan and states she followed her eating plan for approximately 8 days. Ilyana states she is going to the gym on Monday and Wednesday for 50 minutes and biking for 35 minutes 6 times per week.  Today's visit was #: 2 Starting weight: 295 lbs Starting date: 09/11/2019 Today's weight: 293 lbs Today's date: 09/25/2019 Total lbs lost to date: 2 lbs Total lbs lost since last in-office visit: 2 lbs  Interim History: Gagandeep says she likes the break and cereal.  She also likes Premier Protein.  After 7-8 days on the diet, she was constipated and swollen.  She used decreased sodium deli meat.  She thinks her dairy intolerance is worse.  She reports eating on plan for breakfast, lunch, and dinner.  For snacks, she had [place] bars, apples, or blackberries.  She reports drinking a gallon of water a day.  Subjective:   1. Prediabetes, with polyphagia Jaimey has a diagnosis of prediabetes based on her elevated HgA1c and was informed this puts her at greater risk of developing diabetes. She continues to work on diet and exercise to decrease her risk of diabetes. She denies nausea or hypoglycemia.  She had an allergic reaction to Ozempic in the past (urticaria, hives).  Lab Results  Component Value Date   HGBA1C 6.0 (H) 09/11/2019   Lab Results  Component Value Date   INSULIN 26.1 (H) 09/11/2019   2. Vitamin D deficiency Faduma's Vitamin D level was 25.7 on 09/11/2019. She is currently taking OTC vitamin D 2000 each day. She denies nausea, vomiting or muscle weakness.  3. High serum ferritin This is worsening.  Question history of thallasemia.  MCV less than 80.  Anemia studies normal otherwise.  CBC Latest Ref Rng & Units 09/11/2019 06/10/2019 12/15/2017  WBC 3.4 - 10.8 x10E3/uL 9.1 9.4 10.5    Hemoglobin 11.1 - 15.9 g/dL 13.9 14.0 13.3  Hematocrit 34.0 - 46.6 % 42.8 44.7 41.6  Platelets 150 - 450 x10E3/uL 242 233 238   Lab Results  Component Value Date   IRON 48 09/11/2019   TIBC 282 09/11/2019   FERRITIN 363 (H) 09/11/2019   Lab Results  Component Value Date   VITAMINB12 427 09/11/2019   4. Other insomnia Arrietty is taking melatonin 2 mg four hours before bed.  She does not have sleep apnea per previous test.  She has poor sleep due to racing thoughts.  5. Edema, unspecified type Kayelynn's edema is worse lately.  She previously took spironolactone.  6. Other depression, with emotional eating Clariece is struggling with emotional eating and using food for comfort to the extent that it is negatively impacting her health. She has been working on behavior modification techniques to help reduce her emotional eating. She shows no sign of suicidal or homicidal ideations.  She has been to see Dr. Mallie Mussel.  Assessment/Plan:   1. Prediabetes Harmonii will continue to work on weight loss, exercise, and decreasing simple carbohydrates to help decrease the risk of diabetes.  Will start patient on metformin 500 mg daily.  2. Vitamin D deficiency Low Vitamin D level contributes to fatigue and are associated with obesity, breast, and colon cancer. She agrees to start taking prescription Vitamin D @50 ,000 IU every week and will follow-up for routine testing of Vitamin D,  at least 2-3 times per year to avoid over-replacement.  3. High serum ferritin Will refer Oreta to Hematology.  4. Other insomnia Kamariya will start trazodone 25-50 mg at bedtime as needed for insomnia.  5. Edema, unspecified type Will start Kanijah on spironolactone 50 mg daily as needed for edema.  6. Other depression, with emotional eating Behavior modification techniques were discussed today to help Raziah deal with her emotional/non-hunger eating behaviors.  Orders and follow up as documented in patient record.    7. At  risk for diabetes mellitus Vantasia was given approximately 15 minutes of diabetes education and counseling today. We discussed intensive lifestyle modifications today with an emphasis on weight loss as well as increasing exercise and decreasing simple carbohydrates in her diet. We also reviewed medication options with an emphasis on risk versus benefit of those discussed.   Repetitive spaced learning was employed today to elicit superior memory formation and behavioral change.  8. Class 3 severe obesity with serious comorbidity and body mass index (BMI) of 50.0 to 59.9 in adult, unspecified obesity type ([place] Matia is currently in the action stage of change. As such, her goal is to continue with weight loss efforts. She has agreed to the Category 2 Plan.   Exercise goals: As is.  Behavioral modification strategies: increasing lean protein intake and increasing water intake.  No dairy.  Gracelynne has agreed to follow-up with our clinic in 2 weeks. She was informed of the importance of frequent follow-up visits to maximize her success with intensive lifestyle modifications for her multiple health conditions.   Objective:   Blood pressure 128/78, pulse (!) 54, temperature 98.4 F (36.9 C), temperature source Oral, height 5\' 3"  (1.6 m), weight 293 lb (132.9 kg), last menstrual period 12/01/2013, SpO2 95 %. Body mass index is 51.9 kg/m.  General: Cooperative, alert, well developed, in no acute distress. HEENT: Conjunctivae and lids unremarkable. Cardiovascular: Regular rhythm.  Lungs: Normal work of breathing. Neurologic: No focal deficits.   Lab Results  Component Value Date   CREATININE 0.85 09/11/2019   BUN 18 09/11/2019   NA 138 09/11/2019   K 4.2 09/11/2019   CL 101 09/11/2019   CO2 24 09/11/2019   Lab Results  Component Value Date   ALT 22 09/11/2019   AST 20 09/11/2019   ALKPHOS 83 09/11/2019   BILITOT 0.7 09/11/2019   Lab Results  Component Value Date   HGBA1C 6.0 (H)  09/11/2019   HGBA1C 6.0 03/08/2016   HGBA1C 5.9 08/18/2015   HGBA1C 5.9 03/03/2015   HGBA1C 6.0 10/03/2014   Lab Results  Component Value Date   INSULIN 26.1 (H) 09/11/2019   Lab Results  Component Value Date   TSH 2.140 09/11/2019   Lab Results  Component Value Date   CHOL 158 09/11/2019   HDL 58 09/11/2019   LDLCALC 84 09/11/2019   TRIG 87 09/11/2019   CHOLHDL 2.7 09/11/2019   Lab Results  Component Value Date   WBC 9.1 09/11/2019   HGB 13.9 09/11/2019   HCT 42.8 09/11/2019   MCV 77 (L) 09/11/2019   PLT 242 09/11/2019   Lab Results  Component Value Date   IRON 48 09/11/2019   TIBC 282 09/11/2019   FERRITIN 363 (H) 09/11/2019   Attestation Statements:   Reviewed by clinician on day of visit: allergies, medications, problem list, medical history, surgical history, family history, social history, and previous encounter notes.  I, Water quality scientist, CMA, am acting as Location manager for [employer], DO.  I have reviewed the above documentation for accuracy and completeness, and I agree with the above. Briscoe Deutscher, DO

## 2019-09-25 NOTE — Telephone Encounter (Signed)
Received a new hem referral from Dr. Juleen China for high ferritin. I offered the pt to be seen at [facility] location but she declined because she preferred to see a female MD, not a PA or NP. She's been scheduled to see Dr. Burr Medico on 4/26 at 1pm w/labs at 1230pm. Pt aware to arrive 15 minutes early.

## 2019-09-27 ENCOUNTER — Ambulatory Visit (INDEPENDENT_AMBULATORY_CARE_PROVIDER_SITE_OTHER): Payer: Self-pay | Admitting: Psychology

## 2019-10-10 NOTE — Progress Notes (Signed)
[place]   [phone] Fax:(336) [phone]   Clinic New Consult Note   Patient Care Team: Karleen Hampshire., MD as PCP - General (Internal Medicine) Milus Banister, MD as Attending Physician (Gastroenterology) Marylynn Pearson, MD as Consulting Physician (Obstetrics and Gynecology) Ceasar Mons, MD as Consulting Physician (Urology)  Date of Service:  10/15/2019   CHIEF COMPLAINTS/PURPOSE OF CONSULTATION:  High Ferritin   REFERRING PHYSICIAN:  Dr. Juleen China  HISTORY OF PRESENTING ILLNESS:  Diana Schultz 54 y.o. female is a here because of high Ferritin. The patient was referred by her weight management Dr. Juleen China. The patient presents to the clinic today alone  She notes in there past she was anemic over 7 years ago and placed on oral iron in [place], [place]. She was instructed to stop oral iron due to elevated anemia.  She was first seen to have elevated ferritin on 03/24/18 at 267 in [place]. Labs showed MCV 76.9, MCH 24.2. At this time she was found to have AVM of lung as seen on CT scan. She notes her labs are suspicious for Thalassemia anemia. Lately she has been seeing Dr. Juleen China for weight management. Her labs there showed Ferritin has been elevated, Ferritin 363 on 09/11/19.  She had h/o right leg DVT due to birth control use. Her mother had stroke before. Her sister has triple negative breast cancer.   Today she notes she has no energy at all. She understands her weight may be part of this. She sometimes feels this could be depressed. She also notes chest pain intermittently. She has mid and lower back pain along with epigastric pain daily. Her epigastric pain increases after eating. She has normal BMs.   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 She is married with 2 children. She denies issues with her pregnancies. She has a PMHx of DM, Diverticulosis, H/o right leg DVT, Fatty Liver, HTN. She is not on medication at this time. She  is working with Dr Juleen China for weight loss but with diet change and exercise she is still not able to lose weight. She is considering medical options.     REVIEW OF SYSTEMS:   Constitutional: Denies fevers, chills or abnormal night sweats (+) Significant fatigue  Eyes: Denies blurriness of vision, double vision or watery eyes Ears, nose, mouth, throat, and face: Denies mucositis or sore throat Respiratory: Denies cough, dyspnea or wheezes  Cardiovascular: Denies palpitation or lower extremity swelling (+) intermittent chest pain  Gastrointestinal:  Denies nausea, heartburn or change in bowel habits (+) Epigastric pain Skin: Denies abnormal skin rashes MSK: (+) Lower back pain  Lymphatics: Denies new lymphadenopathy or easy bruising Neurological:Denies numbness, tingling or new weaknesses Behavioral/Psych: Mood is stable, no new changes  All other systems were reviewed with the patient and are negative.  MEDICAL HISTORY:  Past Medical History:  Diagnosis Date  . Arteriovenous malformation   . Depression   . Diabetes mellitus without complication (HCC)    borderline - no meds  . Diverticulitis   . DVT (deep venous thrombosis) (HCC)    Hx at age 72 blood clot in right leg, d/c birth control pills and no other problems  . Fatty liver   . Gallbladder disease   . Hypertension    history,lost weight, controlled with diet and exercise, no current med  . Lactose intolerance   . Ovarian cyst   . Thalassemia   . Umbilical hernia   . Vaginal delivery  1984, 1989  . Vitamin D deficiency     SURGICAL HISTORY: Past Surgical History:  Procedure Laterality Date  . ABDOMINAL HYSTERECTOMY    . ABLATION  03/23/13   [facility]  . APPENDECTOMY    . CHOLECYSTECTOMY    . DILATION AND CURETTAGE OF UTERUS    . DILATION AND CURETTAGE OF UTERUS N/A 11/01/2013   Procedure: DILATATION AND CURETTAGE;  Surgeon: Mora Bellman, MD;  Location: [facility];  Service: Gynecology;  Laterality: N/A;  . DILITATION &  CURRETTAGE/HYSTROSCOPY WITH NOVASURE ABLATION N/A 03/23/2013   Procedure: DILATATION & CURETTAGE/HYSTEROSCOPY WITH NOVASURE ABLATION;  Surgeon: Lavonia Drafts, MD;  Location: [facility];  Service: Gynecology;  Laterality: N/A;  . LEEP    . TUBAL LIGATION    . VAGINAL HYSTERECTOMY N/A 12/31/2013   Procedure: HYSTERECTOMY VAGINAL;  Surgeon: Lavonia Drafts, MD;  Location: [facility];  Service: Gynecology;  Laterality: N/A;    SOCIAL HISTORY: Social History   Socioeconomic History  . Marital status: Married    Spouse name: Not on file  . Number of children: 2  . Years of education: Not on file  . Highest education level: Not on file  Occupational History  . Occupation: CNA, med tech  by training, does prn works  Tobacco Use  . Smoking status: Never Smoker  . Smokeless tobacco: Never Used  Substance and Sexual Activity  . Alcohol use: No    Comment: rare   . Drug use: No  . Sexual activity: Not Currently    Birth control/protection: Surgical  Other Topics Concern  . Not on file  Social History Narrative   Original from [place], household: pt and husband   Social Determinants of Radio broadcast assistant Strain:   . Difficulty of Paying Living Expenses:   Food Insecurity:   . Worried About Charity fundraiser in the Last Year:   . Arboriculturist in the Last Year:   Transportation Needs:   . Film/video editor (Medical):   Marland Kitchen Lack of Transportation (Non-Medical):   Physical Activity:   . Days of Exercise per Week:   . Minutes of Exercise per Session:   Stress:   . Feeling of Stress :   Social Connections:   . Frequency of Communication with Friends and Family:   . Frequency of Social Gatherings with Friends and Family:   . Attends Religious Services:   . Active Member of Clubs or Organizations:   . Attends Archivist Meetings:   Marland Kitchen Marital Status:   Intimate Partner Violence:   . Fear of Current or Ex-Partner:   . Emotionally Abused:   Marland Kitchen Physically  Abused:   . Sexually Abused:     FAMILY HISTORY: Family History  Problem Relation Age of Onset  . Hypertension Mother   . Diabetes Mother   . Stroke Mother        early in life  . Endometrial cancer Mother   . Hyperlipidemia Mother   . Depression Mother   . Mental retardation Father   . Breast cancer Father   . Depression Father   . Hypertension Sister   . Hypertension Brother   . Heart failure Paternal Grandfather   . CAD Other        GP, late in like  . Colon cancer Neg Hx     ALLERGIES:  is allergic to amlodipine; metoprolol; benicar [olmesartan]; hydrochlorothiazide; lactose intolerance (gi); lisinopril; nebivolol; and semaglutide(0.25 or 0.5mg -dos).  MEDICATIONS:  Current Outpatient  Medications  Medication Sig Dispense Refill  . acetaminophen (TYLENOL) 500 MG tablet Take 500 mg by mouth every 6 (six) hours as needed for moderate pain.    . Melatonin 2.5 MG CAPS Take 1 capsule by mouth at bedtime.     No current facility-administered medications for this visit.    PHYSICAL EXAMINATION: ECOG PERFORMANCE STATUS: 1 - Symptomatic but completely ambulatory  Vitals:   10/15/19 1333  BP: (!) 164/104  Pulse: 66  Resp: 18  Temp: 98.5 F (36.9 C)  SpO2: 100%   Filed Weights   10/15/19 1333  Weight: 297 lb (134.7 kg)    GENERAL:alert, no distress and comfortable SKIN: skin color, texture, turgor are normal, no rashes or significant lesions EYES: normal, Conjunctiva are pink and non-injected, sclera clear  NECK: supple, thyroid normal size, non-tender, without nodularity LYMPH:  no palpable lymphadenopathy in the cervical, axillary  LUNGS: clear to auscultation and percussion with normal breathing effort HEART: regular rate & rhythm and no murmurs and no lower extremity edema ABDOMEN:abdomen soft, non-tender and normal bowel sounds (+) Epigastric Tenderness Musculoskeletal:no cyanosis of digits and no clubbing  NEURO: alert & oriented x 3 with fluent speech, no  focal motor/sensory deficits  LABORATORY DATA:  I have reviewed the data as listed CBC Latest Ref Rng & Units 09/11/2019 06/10/2019 12/15/2017  WBC 3.4 - 10.8 x10E3/uL 9.1 9.4 10.5  Hemoglobin 11.1 - 15.9 g/dL 13.9 14.0 13.3  Hematocrit 34.0 - 46.6 % 42.8 44.7 41.6  Platelets 150 - 450 x10E3/uL 242 233 238    CMP Latest Ref Rng & Units 09/11/2019 06/10/2019 12/15/2017  Glucose 65 - 99 mg/dL 100(H) 95 96  BUN 6 - 24 mg/dL 18 17 16   Creatinine 0.57 - 1.00 mg/dL 0.85 0.81 0.80  Sodium 134 - 144 mmol/L 138 139 142  Potassium 3.5 - 5.2 mmol/L 4.2 4.8 4.2  Chloride 96 - 106 mmol/L 101 106 108  CO2 20 - 29 mmol/L 24 25 26   Calcium 8.7 - 10.2 mg/dL 9.4 9.5 9.3  Total Protein 6.0 - 8.5 g/dL 7.3 - 7.6  Total Bilirubin 0.0 - 1.2 mg/dL 0.7 - 1.1  Alkaline Phos 39 - 117 IU/L 83 - 72  AST 0 - 40 IU/L 20 - 26  ALT 0 - 32 IU/L 22 - 22     RADIOGRAPHIC STUDIES: I have personally reviewed the radiological images as listed and agreed with the findings in the report. No results found.   CT [facility] W contrast [facility]  06/13/19 Impression: 1. No acute findings within the abdomen or pelvis. 2. Small incidental 1.4 cm left adrenal nodule. See below recommendations. 3. Tiny calcification along the posterior right hepatic lobe. This is nonspecific and could be related to prior infection, fat infarct, or potentially a dropped gallstone.   ASSESSMENT & PLAN:  KITZIA HONEGGER is a 54 y.o. Caucasian female with a history of Depression, DM, Diverticulosis, H/o right leg DVT, Fatty Liver, HTN.    1. Elevated Ferritin, Microcytosis  -Per pt she had a h/o anemia and was on oral iron pill many years ago. Her oral iron was later on d/c due to elevated levels.  -her CBC since 2013 showed persistent mild microcytosis, no anemia or other abnormal blood counts. -In 03/2018 at [facility] she had Ferritin at 267, MCV 76.9, MCH 24.2. There was suspicion for her being a Thalassemia Carrier. -Her more recent labs show  Ferritin increased to 363 on 09/11/19 with MCV 77, MCH 25.  -  I recommend genetic testing for thalassemia as she may be a carrier and can effect her children. She is agreeable. -I discussed ferritin level can reflect iron storage, but high level could also be related to chronic inflammation, such as rheumatological disorder or infection.  She has no obvious clinical presentation of rheumatological disorder or infection, I will check ANA and inflammation markers today.  -Given the normal serum iron and transferrin saturation, my suspicion of iron overload and hemochromatosis is low, I will check HFE gene mutation today.  Her CT abdomen and pelvis with contrast in June 2019 showed unremarkable liver and spleen.  I do not feel she needs liver MRI or biopsy at this point.  -She did have epigastric tenderness on exam today. She has been having Epigastric pain which is exacerbated by eating, will monitor   2. Obesity, Fatigue  -She has been working with Dr. Juleen China on weight management. She has been altering her diet and exercising without success. She is considering medical options with Dr Juleen China -Her fatigue is significant. She notes it could be related to her weigh or her mood. Will monitor.    3. Comorbidities: DM, HTN, H/o right leg DVT.  -not currently on medications. She is working on controlling with diet and exercise with Dr Juleen China.    4. Adrenal Nodule  -Seen on 06/13/19 CT AP at [facility]. This is likely benign and she will continue to f/u with [facility].     PLAN:  -Lab today for hemoglobin electrophoresis, hemochromatosis DNA, ANA and sedimentation rate, I will call her with lab result  -Lab and F/u in 6 months    Orders Placed This Encounter  Procedures  . Hemochromatosis DNA, PCR    Standing Status:   Future    Number of Occurrences:   1    Standing Expiration Date:   10/14/2020  . ANA, IFA (with reflex)    Standing Status:   Future    Number of Occurrences:   1     Standing Expiration Date:   10/14/2020  . Sedimentation rate    Standing Status:   Future    Number of Occurrences:   1    Standing Expiration Date:   10/14/2020  . Hgb Fractionation [place]    All questions were answered. The patient knows to call the clinic with any problems, questions or concerns. The total time spent in the appointment was 35 minutes.     Truitt Merle, MD 10/15/2019   I, Joslyn Devon, am acting as scribe for Truitt Merle, MD.   I have reviewed the above documentation for accuracy and completeness, and I agree with the above.

## 2019-10-15 ENCOUNTER — Inpatient Hospital Stay: Payer: BC Managed Care – PPO | Attending: Hematology | Admitting: Hematology

## 2019-10-15 ENCOUNTER — Inpatient Hospital Stay: Payer: BC Managed Care – PPO

## 2019-10-15 ENCOUNTER — Encounter: Payer: Self-pay | Admitting: Hematology

## 2019-10-15 ENCOUNTER — Other Ambulatory Visit: Payer: Self-pay

## 2019-10-15 VITALS — BP 164/104 | HR 66 | Temp 98.5°F | Resp 18 | Ht 63.0 in | Wt 297.0 lb

## 2019-10-15 DIAGNOSIS — I10 Essential (primary) hypertension: Secondary | ICD-10-CM | POA: Insufficient documentation

## 2019-10-15 DIAGNOSIS — R7989 Other specified abnormal findings of blood chemistry: Secondary | ICD-10-CM | POA: Diagnosis not present

## 2019-10-15 DIAGNOSIS — R5383 Other fatigue: Secondary | ICD-10-CM | POA: Diagnosis not present

## 2019-10-15 DIAGNOSIS — E119 Type 2 diabetes mellitus without complications: Secondary | ICD-10-CM | POA: Insufficient documentation

## 2019-10-15 DIAGNOSIS — R079 Chest pain, unspecified: Secondary | ICD-10-CM | POA: Diagnosis not present

## 2019-10-15 DIAGNOSIS — R718 Other abnormality of red blood cells: Secondary | ICD-10-CM | POA: Diagnosis not present

## 2019-10-15 DIAGNOSIS — Z86718 Personal history of other venous thrombosis and embolism: Secondary | ICD-10-CM

## 2019-10-15 DIAGNOSIS — M545 Low back pain: Secondary | ICD-10-CM | POA: Diagnosis not present

## 2019-10-15 DIAGNOSIS — R1013 Epigastric pain: Secondary | ICD-10-CM | POA: Insufficient documentation

## 2019-10-15 DIAGNOSIS — K760 Fatty (change of) liver, not elsewhere classified: Secondary | ICD-10-CM | POA: Diagnosis not present

## 2019-10-15 DIAGNOSIS — E669 Obesity, unspecified: Secondary | ICD-10-CM | POA: Insufficient documentation

## 2019-10-15 DIAGNOSIS — E279 Disorder of adrenal gland, unspecified: Secondary | ICD-10-CM

## 2019-10-15 LAB — SEDIMENTATION RATE: Sed Rate: 16 mm/hr (ref 0–22)

## 2019-10-16 ENCOUNTER — Telehealth: Payer: Self-pay | Admitting: Hematology

## 2019-10-16 LAB — ANTINUCLEAR ANTIBODIES, IFA: ANA Ab, IFA: NEGATIVE

## 2019-10-16 NOTE — Telephone Encounter (Signed)
Scheduled appt per 4/26 los. ° °Spoke with pt and she is aware of the appt date and time. °

## 2019-10-17 ENCOUNTER — Ambulatory Visit (INDEPENDENT_AMBULATORY_CARE_PROVIDER_SITE_OTHER): Payer: BC Managed Care – PPO | Admitting: Family Medicine

## 2019-10-17 LAB — HGB FRACTIONATION CASCADE
Hgb A2: 2.4 % (ref 1.8–3.2)
Hgb A: 97.6 % (ref 96.4–98.8)
Hgb F: 0 % (ref 0.0–2.0)
Hgb S: 0 %

## 2019-10-18 ENCOUNTER — Telehealth (INDEPENDENT_AMBULATORY_CARE_PROVIDER_SITE_OTHER): Payer: BC Managed Care – PPO | Admitting: Psychology

## 2019-10-19 LAB — HEMOCHROMATOSIS DNA-PCR(C282Y,H63D)

## 2019-10-21 ENCOUNTER — Other Ambulatory Visit: Payer: Self-pay | Admitting: Hematology

## 2019-10-21 DIAGNOSIS — R718 Other abnormality of red blood cells: Secondary | ICD-10-CM

## 2019-10-22 ENCOUNTER — Telehealth: Payer: Self-pay | Admitting: *Deleted

## 2019-10-22 NOTE — Telephone Encounter (Signed)
Notified of message below. Message to scheduler 

## 2019-10-22 NOTE — Telephone Encounter (Signed)
-----   Message from Truitt Merle, MD sent at 10/21/2019 11:43 AM EDT ----- Please let pt know her tests for hemochromatosis and beta thalassemia were all negative. I recommend alpha-thalassemia genotype test, please schedule her lab appointment, thanks  Truitt Merle

## 2019-10-23 ENCOUNTER — Other Ambulatory Visit: Payer: Self-pay

## 2019-10-23 ENCOUNTER — Inpatient Hospital Stay: Payer: BC Managed Care – PPO | Attending: Hematology

## 2019-10-23 DIAGNOSIS — R718 Other abnormality of red blood cells: Secondary | ICD-10-CM | POA: Diagnosis not present

## 2019-11-02 LAB — ALPHA-THALASSEMIA GENOTYPR

## 2019-11-05 ENCOUNTER — Encounter: Payer: Self-pay | Admitting: Hematology

## 2020-02-28 ENCOUNTER — Other Ambulatory Visit: Payer: Self-pay

## 2020-02-28 ENCOUNTER — Emergency Department (HOSPITAL_BASED_OUTPATIENT_CLINIC_OR_DEPARTMENT_OTHER)
Admission: EM | Admit: 2020-02-28 | Discharge: 2020-02-28 | Disposition: A | Discharge status: Home / Self Care | Payer: BC Managed Care – PPO | Attending: Emergency Medicine | Admitting: Emergency Medicine

## 2020-02-28 ENCOUNTER — Encounter (HOSPITAL_BASED_OUTPATIENT_CLINIC_OR_DEPARTMENT_OTHER): Payer: Self-pay | Admitting: *Deleted

## 2020-02-28 ENCOUNTER — Emergency Department (HOSPITAL_BASED_OUTPATIENT_CLINIC_OR_DEPARTMENT_OTHER): Payer: BC Managed Care – PPO

## 2020-02-28 DIAGNOSIS — R05 Cough: Secondary | ICD-10-CM | POA: Diagnosis not present

## 2020-02-28 DIAGNOSIS — U071 COVID-19: Secondary | ICD-10-CM | POA: Diagnosis not present

## 2020-02-28 DIAGNOSIS — E119 Type 2 diabetes mellitus without complications: Secondary | ICD-10-CM | POA: Diagnosis not present

## 2020-02-28 DIAGNOSIS — I10 Essential (primary) hypertension: Secondary | ICD-10-CM | POA: Insufficient documentation

## 2020-02-28 LAB — RESP PANEL BY RT PCR (RSV, FLU A&B, COVID)
Influenza A by PCR: NEGATIVE
Influenza B by PCR: NEGATIVE
Respiratory Syncytial Virus by PCR: NEGATIVE
SARS Coronavirus 2 by RT PCR: POSITIVE — AB

## 2020-02-28 MED ORDER — ALBUTEROL SULFATE HFA 108 (90 BASE) MCG/ACT IN AERS: 2.0000 | INHALATION_SPRAY | Freq: Once | RESPIRATORY_TRACT | Status: DC | PRN

## 2020-02-28 MED ORDER — SODIUM CHLORIDE 0.9 % IV SOLN: INTRAVENOUS | Status: DC | PRN

## 2020-02-28 MED ORDER — EPINEPHRINE 0.3 MG/0.3ML IJ SOAJ: 0.3000 mg | Freq: Once | INTRAMUSCULAR | Status: DC | PRN

## 2020-02-28 MED ORDER — METHYLPREDNISOLONE SODIUM SUCC 125 MG IJ SOLR: 125.0000 mg | Freq: Once | INTRAMUSCULAR | Status: DC | PRN

## 2020-02-28 MED ORDER — DIPHENHYDRAMINE HCL 50 MG/ML IJ SOLN: 50.0000 mg | Freq: Once | INTRAMUSCULAR | Status: DC | PRN

## 2020-02-28 MED ORDER — ACETAMINOPHEN 325 MG PO TABS
650.0000 mg | ORAL_TABLET | Freq: Once | ORAL | Status: AC
  Administered 2020-02-28: 650 mg via ORAL
  Filled 2020-02-28: qty 2

## 2020-02-28 MED ORDER — SODIUM CHLORIDE 0.9 % IV SOLN
1200.0000 mg | Freq: Once | INTRAVENOUS | Status: AC
  Administered 2020-02-28: 1200 mg via INTRAVENOUS
  Filled 2020-02-28: qty 10

## 2020-02-28 MED ORDER — FAMOTIDINE IN NACL 20-0.9 MG/50ML-% IV SOLN: 20.0000 mg | Freq: Once | INTRAVENOUS | Status: DC | PRN

## 2020-02-28 NOTE — ED Triage Notes (Signed)
Pt c/o dry cough x 1 week  

## 2020-02-28 NOTE — Discharge Instructions (Signed)
You were evaluated in the Emergency Department and after careful evaluation, we did not find any emergent condition requiring admission or further testing in the hospital.  Your exam/testing today is overall reassuring.  Your symptoms seem to be due to COVID-19.  You have tested positive today.  We have provided you with the monoclonal antibody therapy here in the emergency department, which has been shown to prevent severe disease.  We advise you to continue home isolation for the next 10 days.  Use Tylenol or Motrin at home for discomfort.  Please return to the Emergency Department if you experience any worsening of your condition.   Thank you for allowing [place] to be a part of your care.

## 2020-02-28 NOTE — ED Provider Notes (Signed)
[facility] Emergency Department Provider Note MRN:  [id number]  Arrival date & time: 02/28/20     Chief Complaint   Cough (+ covid)   History of Present Illness   Diana Schultz is a 54 y.o. year-old female with a history of diabetes, DVT presenting to the ED with chief complaint of cough.  4 days of persistent cough, malaise, fatigue, felt like she had bronchitis.  Tested positive for Covid today in the waiting room, here for evaluation.  Somewhat short of breath with ambulating but improves with rest.  No chest pain, no abdominal pain, no vomiting or diarrhea.  Review of Systems  A complete 10 system review of systems was obtained and all systems are negative except as noted in the HPI and PMH.   Patient's Health History    Past Medical History:  Diagnosis Date   Arteriovenous malformation    Depression    Diabetes mellitus without complication (HCC)    borderline - no meds   Diverticulitis    DVT (deep venous thrombosis) (HCC)    Hx at age 30 blood clot in right leg, d/c birth control pills and no other problems   Fatty liver    Gallbladder disease    Hypertension    history,lost weight, controlled with diet and exercise, no current med   Lactose intolerance    Ovarian cyst    Thalassemia    Umbilical hernia    Vaginal delivery 1984, 1989   Vitamin D deficiency     Past Surgical History:  Procedure Laterality Date   ABDOMINAL HYSTERECTOMY     ABLATION  03/23/13   [place]   APPENDECTOMY     CHOLECYSTECTOMY     DILATION AND CURETTAGE OF UTERUS     DILATION AND CURETTAGE OF UTERUS N/A 11/01/2013   Procedure: DILATATION AND CURETTAGE;  Surgeon: Mora Bellman, MD;  Location: [facility];  Service: Gynecology;  Laterality: N/A;   DILITATION & CURRETTAGE/HYSTROSCOPY WITH NOVASURE ABLATION N/A 03/23/2013   Procedure: DILATATION & CURETTAGE/HYSTEROSCOPY WITH NOVASURE ABLATION;  Surgeon: Lavonia Drafts, MD;  Location: [facility];   Service: Gynecology;  Laterality: N/A;   LEEP     TUBAL LIGATION     VAGINAL HYSTERECTOMY N/A 12/31/2013   Procedure: HYSTERECTOMY VAGINAL;  Surgeon: Lavonia Drafts, MD;  Location: [facility];  Service: Gynecology;  Laterality: N/A;    Family History  Problem Relation Age of Onset   Hypertension Mother    Diabetes Mother    Stroke Mother        early in life   Endometrial cancer Mother    Hyperlipidemia Mother    Depression Mother    Mental retardation Father    Breast cancer Father    Depression Father    Hypertension Sister    Hypertension Brother    Heart failure Paternal Grandfather    CAD Other        GP, late in like   Colon cancer Neg Hx     Social History   Socioeconomic History   Marital status: Married    Spouse name: Not on file   Number of children: 2   Years of education: Not on file   Highest education level: Not on file  Occupational History   Occupation: CNA, med tech  by training, does prn works  Tobacco Use   Smoking status: Never Smoker   Smokeless tobacco: Never Used  Scientific laboratory technician Use: Never used  Substance and Sexual  Activity   Alcohol use: No    Comment: rare    Drug use: No   Sexual activity: Not Currently    Birth control/protection: Surgical  Other Topics Concern   Not on file  Social History Narrative   Original from [place], household: pt and husband   Social Determinants of Health   Financial Resource Strain:    Difficulty of Paying Living Expenses: Not on file  Food Insecurity:    Worried About Charity fundraiser in the Last Year: Not on file   [employer] of Food in the Last Year: Not on file  Transportation Needs:    Lack of Transportation (Medical): Not on file   Lack of Transportation (Non-Medical): Not on file  Physical Activity:    Days of Exercise per Week: Not on file   Minutes of Exercise per Session: Not on file  Stress:    Feeling of Stress : Not on file  Social  Connections:    Frequency of Communication with Friends and Family: Not on file   Frequency of Social Gatherings with Friends and Family: Not on file   Attends Religious Services: Not on file   Active Member of [place] or Organizations: Not on file   Attends Archivist Meetings: Not on file   Marital Status: Not on file  Intimate Partner Violence:    Fear of Current or Ex-Partner: Not on file   Emotionally Abused: Not on file   Physically Abused: Not on file   Sexually Abused: Not on file     Physical Exam   Vitals:   02/28/20 1423 02/28/20 1549  BP: (!) 154/80 135/78  Pulse: 72 60  Resp: 18 18  Temp: (!) 100.6 F (38.1 C) 99.3 F (37.4 C)  SpO2: 98% 97%    CONSTITUTIONAL: Well-appearing, obese, NAD NEURO:  Alert and oriented x 3, no focal deficits EYES:  eyes equal and reactive ENT/NECK:  no LAD, no JVD CARDIO: Regular rate, well-perfused, normal S1 and S2 PULM:  CTAB no wheezing or rhonchi GI/GU:  normal bowel sounds, non-distended, non-tender MSK/SPINE:  No gross deformities, no edema SKIN:  no rash, atraumatic PSYCH:  Appropriate speech and behavior  *Additional and/or pertinent findings included in MDM below  Diagnostic and Interventional Summary    EKG Interpretation  Date/Time:    Ventricular Rate:    PR Interval:    QRS Duration:   QT Interval:    QTC Calculation:   R Axis:     Text Interpretation:        Labs Reviewed  RESP PANEL BY RT PCR (RSV, FLU A&B, COVID) - Abnormal; Notable for the following components:      Result Value   SARS Coronavirus 2 by RT PCR POSITIVE (*)    All other components within normal limits    DG Chest Portable 1 View  Final Result      Medications  0.9 %  sodium chloride infusion (has no administration in time range)  diphenhydrAMINE (BENADRYL) injection 50 mg (has no administration in time range)  famotidine (PEPCID) IVPB 20 mg premix (has no administration in time range)  methylPREDNISolone  sodium succinate (SOLU-MEDROL) 125 mg/2 mL injection 125 mg (has no administration in time range)  albuterol (VENTOLIN HFA) 108 (90 Base) MCG/ACT inhaler 2 puff (has no administration in time range)  EPINEPHrine (EPI-PEN) injection 0.3 mg (has no administration in time range)  acetaminophen (TYLENOL) tablet 650 mg (650 mg Oral Given 02/28/20 1435)  casirivimab-imdevimab (  REGEN-COV) 1,200 mg in sodium chloride 0.9 % 110 mL IVPB (1,200 mg Intravenous New Bag/Given 02/28/20 1630)     Procedures  /  Critical Care Procedures  ED Course and Medical Decision Making  I have reviewed the triage vital signs, the nursing notes, and pertinent available records from the EMR.  Listed above are laboratory and imaging tests that I personally ordered, reviewed, and interpreted and then considered in my medical decision making (see below for details).  Symptoms consistent with COVID-19, supported by x-ray findings.  The radiology read, and some multifocal bacterial pneumonia, however I see no way that a bacterial source can be identified on x-ray imaging.  Clinically patient is overall well-appearing, reassuring vital signs, only 4 days of illness and has confirmed COVID-19 on swab today.  No indication for antibiotics at this time.  Patient is a candidate for monoclonal antibody therapy.    Diana Schultz was evaluated in Emergency Department on 02/28/2020 for the symptoms described in the history of present illness. She was evaluated in the context of the global COVID-19 pandemic, which necessitated consideration that the patient might be at risk for infection with the SARS-CoV-2 virus that causes COVID-19. Institutional protocols and algorithms that pertain to the evaluation of patients at risk for COVID-19 are in a state of rapid change based on information released by regulatory bodies including the CDC and federal and state organizations. These policies and algorithms were followed during the patient's care in the  ED.   Patient has received monoclonal antibody therapy without complication, appropriate for discharge.  Barth Kirks. Sedonia Small, [place] [email or web address]  Final Clinical Impressions(s) / ED Diagnoses     ICD-10-CM   1. COVID-19  U07.1     ED Discharge Orders    None       Discharge Instructions Discussed with and Provided to Patient:     Discharge Instructions     You were evaluated in the Emergency Department and after careful evaluation, we did not find any emergent condition requiring admission or further testing in the hospital.  Your exam/testing today is overall reassuring.  Your symptoms seem to be due to COVID-19.  You have tested positive today.  We have provided you with the monoclonal antibody therapy here in the emergency department, which has been shown to prevent severe disease.  We advise you to continue home isolation for the next 10 days.  Use Tylenol or Motrin at home for discomfort.  Please return to the Emergency Department if you experience any worsening of your condition.   Thank you for allowing [place] to be a part of your care.       Maudie Flakes, MD 02/28/20 [phone]

## 2020-02-28 NOTE — ED Notes (Signed)
ED Provider at bedside. 

## 2020-03-12 DIAGNOSIS — Z8616 Personal history of COVID-19: Secondary | ICD-10-CM | POA: Diagnosis not present

## 2020-03-12 DIAGNOSIS — R05 Cough: Secondary | ICD-10-CM | POA: Diagnosis not present

## 2020-03-12 DIAGNOSIS — J189 Pneumonia, unspecified organism: Secondary | ICD-10-CM | POA: Diagnosis not present

## 2020-03-12 DIAGNOSIS — R918 Other nonspecific abnormal finding of lung field: Secondary | ICD-10-CM | POA: Diagnosis not present

## 2020-04-16 ENCOUNTER — Inpatient Hospital Stay: Payer: BC Managed Care – PPO

## 2020-04-16 ENCOUNTER — Inpatient Hospital Stay: Payer: BC Managed Care – PPO | Admitting: Hematology

## 2020-05-31 DIAGNOSIS — E559 Vitamin D deficiency, unspecified: Secondary | ICD-10-CM | POA: Diagnosis not present

## 2020-05-31 DIAGNOSIS — Z2821 Immunization not carried out because of patient refusal: Secondary | ICD-10-CM | POA: Diagnosis not present

## 2020-05-31 DIAGNOSIS — Z6841 Body Mass Index (BMI) 40.0 and over, adult: Secondary | ICD-10-CM | POA: Diagnosis not present

## 2020-05-31 DIAGNOSIS — Z13228 Encounter for screening for other metabolic disorders: Secondary | ICD-10-CM | POA: Diagnosis not present

## 2020-05-31 DIAGNOSIS — R7303 Prediabetes: Secondary | ICD-10-CM | POA: Diagnosis not present

## 2020-05-31 DIAGNOSIS — Z13220 Encounter for screening for lipoid disorders: Secondary | ICD-10-CM | POA: Diagnosis not present

## 2020-05-31 DIAGNOSIS — Z1329 Encounter for screening for other suspected endocrine disorder: Secondary | ICD-10-CM | POA: Diagnosis not present

## 2020-05-31 DIAGNOSIS — Z823 Family history of stroke: Secondary | ICD-10-CM | POA: Diagnosis not present

## 2020-05-31 DIAGNOSIS — N390 Urinary tract infection, site not specified: Secondary | ICD-10-CM | POA: Diagnosis not present

## 2020-05-31 DIAGNOSIS — Z833 Family history of diabetes mellitus: Secondary | ICD-10-CM | POA: Diagnosis not present

## 2020-05-31 DIAGNOSIS — Z0001 Encounter for general adult medical examination with abnormal findings: Secondary | ICD-10-CM | POA: Diagnosis not present

## 2020-05-31 DIAGNOSIS — I10 Essential (primary) hypertension: Secondary | ICD-10-CM | POA: Diagnosis not present

## 2020-05-31 DIAGNOSIS — E278 Other specified disorders of adrenal gland: Secondary | ICD-10-CM | POA: Diagnosis not present

## 2020-05-31 DIAGNOSIS — Z79899 Other long term (current) drug therapy: Secondary | ICD-10-CM | POA: Diagnosis not present

## 2020-05-31 DIAGNOSIS — I280 Arteriovenous fistula of pulmonary vessels: Secondary | ICD-10-CM | POA: Diagnosis not present

## 2020-06-02 DIAGNOSIS — N390 Urinary tract infection, site not specified: Secondary | ICD-10-CM | POA: Diagnosis not present

## 2020-06-02 DIAGNOSIS — Z1211 Encounter for screening for malignant neoplasm of colon: Secondary | ICD-10-CM | POA: Diagnosis not present

## 2020-06-02 DIAGNOSIS — E278 Other specified disorders of adrenal gland: Secondary | ICD-10-CM | POA: Diagnosis not present

## 2020-06-02 DIAGNOSIS — Z0000 Encounter for general adult medical examination without abnormal findings: Secondary | ICD-10-CM | POA: Diagnosis not present

## 2020-06-02 DIAGNOSIS — I280 Arteriovenous fistula of pulmonary vessels: Secondary | ICD-10-CM | POA: Diagnosis not present

## 2020-06-02 DIAGNOSIS — I10 Essential (primary) hypertension: Secondary | ICD-10-CM | POA: Diagnosis not present

## 2020-06-18 DIAGNOSIS — Z1231 Encounter for screening mammogram for malignant neoplasm of breast: Secondary | ICD-10-CM | POA: Diagnosis not present

## 2020-06-18 DIAGNOSIS — Z1239 Encounter for other screening for malignant neoplasm of breast: Secondary | ICD-10-CM | POA: Diagnosis not present

## 2020-06-24 DIAGNOSIS — J9811 Atelectasis: Secondary | ICD-10-CM | POA: Diagnosis not present

## 2020-06-24 DIAGNOSIS — M549 Dorsalgia, unspecified: Secondary | ICD-10-CM | POA: Diagnosis not present

## 2020-06-24 DIAGNOSIS — R1012 Left upper quadrant pain: Secondary | ICD-10-CM | POA: Diagnosis not present

## 2020-06-24 DIAGNOSIS — R918 Other nonspecific abnormal finding of lung field: Secondary | ICD-10-CM | POA: Diagnosis not present

## 2020-06-24 DIAGNOSIS — R109 Unspecified abdominal pain: Secondary | ICD-10-CM | POA: Diagnosis not present

## 2020-06-24 DIAGNOSIS — R9389 Abnormal findings on diagnostic imaging of other specified body structures: Secondary | ICD-10-CM | POA: Diagnosis not present

## 2020-06-24 DIAGNOSIS — N3000 Acute cystitis without hematuria: Secondary | ICD-10-CM | POA: Diagnosis not present

## 2020-06-24 DIAGNOSIS — Z9049 Acquired absence of other specified parts of digestive tract: Secondary | ICD-10-CM | POA: Diagnosis not present

## 2020-06-24 DIAGNOSIS — I517 Cardiomegaly: Secondary | ICD-10-CM | POA: Diagnosis not present

## 2020-06-24 DIAGNOSIS — I10 Essential (primary) hypertension: Secondary | ICD-10-CM | POA: Diagnosis not present

## 2020-06-24 DIAGNOSIS — R339 Retention of urine, unspecified: Secondary | ICD-10-CM | POA: Diagnosis not present

## 2020-06-24 DIAGNOSIS — Z888 Allergy status to other drugs, medicaments and biological substances status: Secondary | ICD-10-CM | POA: Diagnosis not present

## 2020-07-08 ENCOUNTER — Emergency Department (HOSPITAL_BASED_OUTPATIENT_CLINIC_OR_DEPARTMENT_OTHER): Payer: BC Managed Care – PPO

## 2020-07-08 ENCOUNTER — Encounter (HOSPITAL_BASED_OUTPATIENT_CLINIC_OR_DEPARTMENT_OTHER): Payer: Self-pay

## 2020-07-08 ENCOUNTER — Other Ambulatory Visit: Payer: Self-pay

## 2020-07-08 ENCOUNTER — Emergency Department (HOSPITAL_BASED_OUTPATIENT_CLINIC_OR_DEPARTMENT_OTHER)
Admission: EM | Admit: 2020-07-08 | Discharge: 2020-07-08 | Disposition: A | Discharge status: Home / Self Care | Payer: BC Managed Care – PPO | Attending: Emergency Medicine | Admitting: Emergency Medicine

## 2020-07-08 DIAGNOSIS — I10 Essential (primary) hypertension: Secondary | ICD-10-CM | POA: Diagnosis not present

## 2020-07-08 DIAGNOSIS — E119 Type 2 diabetes mellitus without complications: Secondary | ICD-10-CM | POA: Diagnosis not present

## 2020-07-08 DIAGNOSIS — N72 Inflammatory disease of cervix uteri: Secondary | ICD-10-CM | POA: Diagnosis not present

## 2020-07-08 DIAGNOSIS — R109 Unspecified abdominal pain: Secondary | ICD-10-CM | POA: Diagnosis not present

## 2020-07-08 DIAGNOSIS — R1031 Right lower quadrant pain: Secondary | ICD-10-CM | POA: Diagnosis not present

## 2020-07-08 LAB — COMPREHENSIVE METABOLIC PANEL
ALT: 21 U/L (ref 0–44)
AST: 17 U/L (ref 15–41)
Albumin: 3.7 g/dL (ref 3.5–5.0)
Alkaline Phosphatase: 61 U/L (ref 38–126)
Anion gap: 7 (ref 5–15)
BUN: 17 mg/dL (ref 6–20)
CO2: 23 mmol/L (ref 22–32)
Calcium: 8.8 mg/dL — ABNORMAL LOW (ref 8.9–10.3)
Chloride: 107 mmol/L (ref 98–111)
Creatinine, Ser: 0.66 mg/dL (ref 0.44–1.00)
GFR, Estimated: >60 mL/min (ref >60)
Glucose, Bld: 90 mg/dL (ref 70–99)
Potassium: 3.7 mmol/L (ref 3.5–5.1)
Sodium: 137 mmol/L (ref 135–145)
Total Bilirubin: 0.5 mg/dL (ref 0.3–1.2)
Total Protein: 7.1 g/dL (ref 6.5–8.1)

## 2020-07-08 LAB — PREGNANCY, URINE: Preg Test, Ur: NEGATIVE

## 2020-07-08 LAB — URINALYSIS, ROUTINE W REFLEX MICROSCOPIC
Bilirubin Urine: NEGATIVE
Glucose, UA: NEGATIVE mg/dL
Hgb urine dipstick: NEGATIVE
Ketones, ur: NEGATIVE mg/dL
Leukocytes,Ua: NEGATIVE
Nitrite: NEGATIVE
Protein, ur: NEGATIVE mg/dL
Specific Gravity, Urine: 1.03 (ref 1.005–1.030)
pH: 5 (ref 5.0–8.0)

## 2020-07-08 LAB — WET PREP, GENITAL
Clue Cells Wet Prep HPF POC: NONE SEEN
Sperm: NONE SEEN
Trich, Wet Prep: NONE SEEN
Yeast Wet Prep HPF POC: NONE SEEN

## 2020-07-08 LAB — LIPASE, BLOOD: Lipase: 35 U/L (ref 11–51)

## 2020-07-08 LAB — CBC
HCT: 43.3 % (ref 36.0–46.0)
Hemoglobin: 13.8 g/dL (ref 12.0–15.0)
MCH: 24.3 pg — ABNORMAL LOW (ref 26.0–34.0)
MCHC: 31.9 g/dL (ref 30.0–36.0)
MCV: 76.2 fL — ABNORMAL LOW (ref 80.0–100.0)
Platelets: 250 10*3/uL (ref 150–400)
RBC: 5.68 MIL/uL — ABNORMAL HIGH (ref 3.87–5.11)
RDW: 15.8 % — ABNORMAL HIGH (ref 11.5–15.5)
WBC: 9.9 10*3/uL (ref 4.0–10.5)
nRBC: 0 % (ref 0.0–0.2)

## 2020-07-08 MED ORDER — ONDANSETRON HCL 4 MG/2ML IJ SOLN
4.0000 mg | Freq: Once | INTRAMUSCULAR | Status: AC
  Administered 2020-07-08: 4 mg via INTRAVENOUS
  Filled 2020-07-08: qty 2

## 2020-07-08 MED ORDER — IOHEXOL 300 MG/ML  SOLN
100.0000 mL | Freq: Once | INTRAMUSCULAR | Status: AC | PRN
  Administered 2020-07-08: 100 mL via INTRAVENOUS

## 2020-07-08 MED ORDER — FENTANYL CITRATE (PF) 100 MCG/2ML IJ SOLN
50.0000 ug | INTRAMUSCULAR | Status: AC | PRN
  Administered 2020-07-08 (×2): 50 ug via INTRAVENOUS
  Filled 2020-07-08 (×2): qty 2

## 2020-07-08 MED ORDER — DOXYCYCLINE HYCLATE 100 MG PO CAPS: 100.0000 mg | ORAL_CAPSULE | Freq: Two times a day (BID) | ORAL | 0 refills | Status: DC

## 2020-07-08 MED ORDER — SODIUM CHLORIDE 0.9 % IV SOLN
1.0000 g | Freq: Once | INTRAVENOUS | Status: AC
  Administered 2020-07-08: 1 g via INTRAVENOUS
  Filled 2020-07-08: qty 10

## 2020-07-08 NOTE — ED Triage Notes (Signed)
Pt reports lower abd pain since Sunday. Pt reports that she attempted to contact her pcp, but unable to see her until Feb. Pt reports uti 2 weeks ago, just finished abx on Thursday. Pt reports when she has abd pain she has to urinate.

## 2020-07-08 NOTE — ED Provider Notes (Signed)
[facility] EMERGENCY DEPARTMENT Provider Note   CSN: [id number] Arrival date & time: 07/08/20  1057     History Chief Complaint  Patient presents with  . Abdominal Pain    Diana Schultz is a 55 y.o. female.  55 yo F with a chief complaints of lower abdominal pain worse on the right than the left.  This is been ongoing for couple weeks now.  Worse with movement palpation and twisting.  She also has some pain in her low back that radiates around and goes into her leg.  She denies loss of bowel or bladder denies loss of rectal sensation denies numbness or weakness in the leg.  She denies fevers denies recent procedure done to her back.  Denies spinal injection.  She was seen by her family physician and there was concern for urinary tract infection and she was started on antibiotics.  Recently finished that course.  She denies vaginal bleeding or discharge.  Denies trauma.  Her movement is worse with movement twisting and palpation.  The history is provided by the patient.  Abdominal Pain Pain location:  RLQ and suprapubic Pain quality: aching, sharp and shooting   Pain radiates to:  R flank and R leg Pain severity:  Moderate Onset quality:  Gradual Associated symptoms: dysuria   Associated symptoms: no chest pain, no chills, no fever, no nausea, no shortness of breath and no vomiting        Past Medical History:  Diagnosis Date  . Arteriovenous malformation   . Depression   . Diabetes mellitus without complication (HCC)    borderline - no meds  . Diverticulitis   . DVT (deep venous thrombosis) (HCC)    Hx at age 34 blood clot in right leg, d/c birth control pills and no other problems  . Fatty liver   . Gallbladder disease   . Hypertension    history,lost weight, controlled with diet and exercise, no current med  . Lactose intolerance   . Ovarian cyst   . Thalassemia   . Umbilical hernia   . Vaginal delivery 1984, 1989  . Vitamin D deficiency     Patient  Active Problem List   Diagnosis Date Noted  . Postmenopausal 09/11/2019  . History of cholecystectomy 09/11/2019  . Osteoarthritis of left hip 09/11/2019  . 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 Prediabetes 02/14/2018  . Adrenal adenoma, left 12/16/2017  . Chronic RLQ pain 04/20/2017  . Deep dyspareunia 04/20/2017  . Morbid obesity ([place]) 03/15/2017  . Vitamin D deficiency 03/15/2017  . Essential hypertension 08/29/2013    Past Surgical History:  Procedure Laterality Date  . ABDOMINAL HYSTERECTOMY    . ABLATION  03/23/13   [facility]  . APPENDECTOMY    . CHOLECYSTECTOMY    . DILATION AND CURETTAGE OF UTERUS    . DILATION AND CURETTAGE OF UTERUS N/A 11/01/2013   Procedure: DILATATION AND CURETTAGE;  Surgeon: Mora Bellman, MD;  Location: [facility];  Service: Gynecology;  Laterality: N/A;  . DILITATION & CURRETTAGE/HYSTROSCOPY WITH NOVASURE ABLATION N/A 03/23/2013   Procedure: DILATATION & CURETTAGE/HYSTEROSCOPY WITH NOVASURE ABLATION;  Surgeon: Lavonia Drafts, MD;  Location: [facility];  Service: Gynecology;  Laterality: N/A;  . LEEP    . TUBAL LIGATION    . VAGINAL HYSTERECTOMY N/A 12/31/2013   Procedure: HYSTERECTOMY VAGINAL;  Surgeon: Lavonia Drafts, MD;  Location: [facility];  Service: Gynecology;  Laterality: N/A;     OB History    Gravida  2  Para  2   Term  2   Preterm      AB      Living  2     SAB      IAB      Ectopic      Multiple      Live Births              Family History  Problem Relation Age of Onset  . Hypertension Mother   . Diabetes Mother   . Stroke Mother        early in life  . Endometrial cancer Mother   . Hyperlipidemia Mother   . Depression Mother   . Mental retardation Father   . Breast cancer Father   . Depression Father   . Hypertension Sister   . Hypertension Brother   . Heart failure Paternal Grandfather   . CAD Other        GP, late in like  . Colon cancer Neg Hx     Social History   Tobacco Use   . Smoking status: Never Smoker  . Smokeless tobacco: Never Used  Vaping Use  . Vaping Use: Never used  Substance Use Topics  . Alcohol use: No    Comment: rare   . Drug use: No    Home Medications Prior to Admission medications   Medication Sig Start Date End Date Taking? Authorizing Provider  acetaminophen (TYLENOL) 500 MG tablet Take 500 mg by mouth every 6 (six) hours as needed for moderate pain.    [provider]  doxycycline (VIBRAMYCIN) 100 MG capsule Take 1 capsule (100 mg total) by mouth 2 (two) times daily. One po bid x 7 days 07/08/20   Deno Etienne, DO  Melatonin 2.5 MG CAPS Take 1 capsule by mouth at bedtime.    [provider]    Allergies    Amlodipine, Metoprolol, Benicar [olmesartan], Hydrochlorothiazide, Lactose intolerance (gi), Lisinopril, Nebivolol, and Semaglutide(0.25 or 0.5mg -dos)  Review of Systems   Review of Systems  Constitutional: Negative for chills and fever.  HENT: Negative for congestion and rhinorrhea.   Eyes: Negative for redness and visual disturbance.  Respiratory: Negative for shortness of breath and wheezing.   Cardiovascular: Negative for chest pain and palpitations.  Gastrointestinal: Positive for abdominal pain. Negative for nausea and vomiting.  Genitourinary: Positive for dysuria. Negative for urgency.  Musculoskeletal: Negative for arthralgias and myalgias.  Skin: Negative for pallor and wound.  Neurological: Negative for dizziness and headaches.    Physical Exam Updated Vital Signs BP (!) 171/98 (BP Location: Right Arm)   Pulse 67   Temp 98.1 F (36.7 C) (Oral)   Resp 18   Ht 5' 3.5" (1.613 m)   Wt (!) 136.5 kg   LMP 12/01/2013 Comment: hx ablation 03/23/2013  SpO2 98%   BMI 52.48 kg/m   Physical Exam Vitals and nursing note reviewed.  Constitutional:      General: She is not in acute distress.    Appearance: She is well-developed and well-nourished. She is not diaphoretic.     Comments: BMI 52  HENT:      Head: Normocephalic and atraumatic.  Eyes:     Extraocular Movements: EOM normal.     Pupils: Pupils are equal, round, and reactive to light.  Cardiovascular:     Rate and Rhythm: Normal rate and regular rhythm.     Heart sounds: No murmur heard. No friction rub. No gallop.   Pulmonary:  Effort: Pulmonary effort is normal.     Breath sounds: No wheezing or rales.  Abdominal:     General: There is no distension.     Palpations: Abdomen is soft.     Tenderness: There is no abdominal tenderness.  Genitourinary:    Comments: Whitish discharge.  Cervical motion tenderness.  Diffusely painful about the uterus. Musculoskeletal:        General: No tenderness or edema.     Cervical back: Normal range of motion and neck supple.  Skin:    General: Skin is warm and dry.  Neurological:     Mental Status: She is alert and oriented to person, place, and time.  Psychiatric:        Mood and Affect: Mood and affect normal.        Behavior: Behavior normal.     ED Results / Procedures / Treatments   Labs (all labs ordered are listed, but only abnormal results are displayed) Labs Reviewed  WET PREP, GENITAL - Abnormal; Notable for the following components:      Result Value   WBC, Wet Prep HPF POC FEW (*)    All other components within normal limits  COMPREHENSIVE METABOLIC PANEL - Abnormal; Notable for the following components:   Calcium 8.8 (*)    All other components within normal limits  CBC - Abnormal; Notable for the following components:   RBC 5.68 (*)    MCV 76.2 (*)    MCH 24.3 (*)    RDW 15.8 (*)    All other components within normal limits  LIPASE, BLOOD  URINALYSIS, ROUTINE W REFLEX MICROSCOPIC  PREGNANCY, URINE  GC/CHLAMYDIA PROBE AMP ([place]) NOT AT [facility]    EKG None  Radiology CT ABDOMEN PELVIS W CONTRAST  Result Date: 07/08/2020 CLINICAL DATA:  Right lower quadrant abdominal pain. EXAM: CT ABDOMEN AND PELVIS WITH CONTRAST TECHNIQUE: Multidetector CT  imaging of the abdomen and pelvis was performed using the standard protocol following bolus administration of intravenous contrast. CONTRAST:  141mL OMNIPAQUE IOHEXOL 300 MG/ML  SOLN COMPARISON:  June 13, 2019. FINDINGS: Lower chest: No acute abnormality. Hepatobiliary: No focal liver abnormality is seen. Status post cholecystectomy. No biliary dilatation. Pancreas: Unremarkable. No pancreatic ductal dilatation or surrounding inflammatory changes. Spleen: Normal in size without focal abnormality. Adrenals/Urinary Tract: Stable left adrenal adenoma. Right adrenal gland appears normal. Small nonobstructive left renal calculus is noted. No hydronephrosis or renal obstruction is noted. No ureteral calculi are noted. Urinary bladder is unremarkable. Stomach/Bowel: The stomach appears normal. There is no evidence of bowel obstruction or inflammation. Status post appendectomy. Vascular/Lymphatic: No significant vascular findings are present. No enlarged abdominal or pelvic lymph nodes. Reproductive: Status post hysterectomy. No adnexal masses. Other: Small fat containing periumbilical hernia is noted. No ascites is noted. Musculoskeletal: No acute or significant osseous findings. IMPRESSION: 1. Stable left adrenal adenoma. 2. Small nonobstructive left renal calculus. No hydronephrosis or renal obstruction is noted. 3. Small fat containing periumbilical hernia. 4. No other abnormality seen in the abdomen or pelvis. Electronically Signed   By: Marijo Conception M.D.   On: 07/08/2020 15:17    Procedures Procedures (including critical care time)  Medications Ordered in ED Medications  cefTRIAXone (ROCEPHIN) 1 g in sodium chloride 0.9 % 100 mL IVPB (has no administration in time range)  fentaNYL (SUBLIMAZE) injection 50 mcg (50 mcg Intravenous Given 07/08/20 1419)  ondansetron (ZOFRAN) injection 4 mg (4 mg Intravenous Given 07/08/20 1402)  iohexol (OMNIPAQUE) 300 MG/ML solution  100 mL (100 mLs Intravenous Contrast  Given 07/08/20 1500)    ED Course  I have reviewed the triage vital signs and the nursing notes.  Pertinent labs & imaging results that were available during my care of the patient were reviewed by me and considered in my medical decision making (see chart for details).    MDM Rules/Calculators/A&P                          55 yo F with a chief complaints of lower abdominal pain.  Going off and on for a couple weeks now.  Pain sounds like it could be muscular as it is worse with twisting turning and palpation.  She however has pain to the right lower quadrant of her abdomen.  Seems less likely to be appendicitis with greater than 2 weeks of symptoms.  She also has cervical motion tenderness and purulent discharge from her cervix.  We will start her on medications for possible PID.  CT scan of the abdomen pelvis.  Lab work here is unremarkable no leukocytosis no significant LFT elevation lipase is normal.  UA negative for infection.  Awaiting CT scan.  Patient care signed out to Dr. Rogene Houston.  Please see his note for further details care in the ED.  The patients results and plan were reviewed and discussed.   Any x-rays performed were independently reviewed by myself.   Differential diagnosis were considered with the presenting HPI.  Medications  cefTRIAXone (ROCEPHIN) 1 g in sodium chloride 0.9 % 100 mL IVPB (has no administration in time range)  fentaNYL (SUBLIMAZE) injection 50 mcg (50 mcg Intravenous Given 07/08/20 1419)  ondansetron (ZOFRAN) injection 4 mg (4 mg Intravenous Given 07/08/20 1402)  iohexol (OMNIPAQUE) 300 MG/ML solution 100 mL (100 mLs Intravenous Contrast Given 07/08/20 1500)    Vitals:   07/08/20 1110 07/08/20 1200 07/08/20 1300 07/08/20 1456  BP:  (!) 162/78 (!) 164/99 (!) 171/98  Pulse:  61 (!) 56 67  Resp:  19 16 18   Temp:      TempSrc:      SpO2: 98% 98% 97% 98%  Weight:      Height:        Final diagnoses:  Cervicitis      Final Clinical  Impression(s) / ED Diagnoses Final diagnoses:  Cervicitis    Rx / DC Orders ED Discharge Orders         Ordered    doxycycline (VIBRAMYCIN) 100 MG capsule  2 times daily,   Status:  Discontinued        07/08/20 1515    doxycycline (VIBRAMYCIN) 100 MG capsule  2 times daily        07/08/20 [place], [place], DO 07/08/20 1521

## 2020-07-08 NOTE — ED Notes (Signed)
Provider aware that patient is ready to go.

## 2020-07-08 NOTE — Discharge Instructions (Addendum)
Take 4 over the counter ibuprofen tablets 3 times a day or 2 over-the-counter naproxen tablets twice a day for pain. Also take tylenol 1000mg (2 extra strength) four times a day.   CT scan without any acute findings.  They were given Rocephin here in case there is a infection in the pelvic organs.  And continue to take the doxycycline twice a day for the next 7 days.  Make an appointment to follow-up with your OB/GYN.  Return for any new or worse symptoms.

## 2020-07-09 LAB — GC/CHLAMYDIA PROBE AMP (~~LOC~~) NOT AT ARMC
Chlamydia: NEGATIVE
Comment: NEGATIVE
Comment: NORMAL
Neisseria Gonorrhea: NEGATIVE

## 2020-08-01 ENCOUNTER — Encounter: Payer: Self-pay | Admitting: Obstetrics & Gynecology

## 2020-08-01 ENCOUNTER — Other Ambulatory Visit: Payer: Self-pay

## 2020-08-01 ENCOUNTER — Ambulatory Visit (INDEPENDENT_AMBULATORY_CARE_PROVIDER_SITE_OTHER): Payer: BC Managed Care – PPO | Admitting: Obstetrics & Gynecology

## 2020-08-01 ENCOUNTER — Ambulatory Visit (HOSPITAL_BASED_OUTPATIENT_CLINIC_OR_DEPARTMENT_OTHER)
Admission: RE | Admit: 2020-08-01 | Discharge: 2020-08-01 | Disposition: A | Discharge status: Home / Self Care | Payer: BC Managed Care – PPO | Source: Ambulatory Visit | Attending: Obstetrics & Gynecology | Admitting: Obstetrics & Gynecology

## 2020-08-01 VITALS — BP 146/79 | HR 69 | Wt 307.0 lb

## 2020-08-01 DIAGNOSIS — R102 Pelvic and perineal pain: Secondary | ICD-10-CM | POA: Insufficient documentation

## 2020-08-01 DIAGNOSIS — Z90710 Acquired absence of both cervix and uterus: Secondary | ICD-10-CM | POA: Diagnosis not present

## 2020-08-01 LAB — POCT URINALYSIS DIPSTICK
Bilirubin, UA: NEGATIVE
Blood, UA: NEGATIVE
Glucose, UA: NEGATIVE
Ketones, UA: NEGATIVE
Leukocytes, UA: NEGATIVE
Nitrite, UA: NEGATIVE
Protein, UA: NEGATIVE
Spec Grav, UA: 1.02 (ref 1.010–1.025)
Urobilinogen, UA: 0.2 E.U./dL
pH, UA: 6 (ref 5.0–8.0)

## 2020-08-01 NOTE — Progress Notes (Signed)
History:  55 y.o. [id number] here today for eval of pelvic pain. Pt is well known to be for prev care and surgical management with hysterectomy in 2015. Pt reports that she has had intermittent pelvic pain off and on but, in the past 2 weeks she reports severe pelvic pain that feel like a stabbing pain on her left side. She said that after surgery in 2015, her pain resolved. She did have some occ intermittent pain that occurred every other month. Now she reports pain that limits her from, begin sexually active. She report BM's 2-3 times per day of formed stool. She denies dysuria but, does report freq urination.   She also feels like 'something is going to fall out'.  She reports a recent CT that showed cysts on her ovary but, since that time the pain has become unbearable and she reports that this current pain is new x 2 weeks. Pt reports that the current pain that she is having is lower than the other pain that she has experienced.  The following portions of the patient's history were reviewed and updated as appropriate: allergies, current medications, past family history, past medical history, past social history, past surgical history and problem list.  Review of Systems:  Pertinent items are noted in HPI.    Objective:  Physical Exam Blood pressure (!) 146/79, pulse 69, weight (!) 307 lb (139.3 kg), last menstrual period 12/01/2013.  CONSTITUTIONAL: Well-developed, well-nourished female in no acute distress.  HENT:  Normocephalic, atraumatic EYES: Conjunctivae and EOM are normal. No scleral icterus.  NECK: Normal range of motion SKIN: Skin is warm and dry. No rash noted. Not diaphoretic.No pallor. [place]: Alert and oriented to person, place, and time. Normal coordination.  Abd: Soft, nontender and nondistended; obese Pelvic: Normal appearing external genitalia; normal appearing vaginal mucosa. No prolapse noted. Normal discharge.  No palpable masses (difficult to fully assess due to body  habitus). There is adnexal tenderness on the left side that is reproducible and some pain over the bladder.   Labs and Imaging CT ABDOMEN PELVIS W CONTRAST  Result Date: 07/08/2020 CLINICAL DATA:  Right lower quadrant abdominal pain. EXAM: CT ABDOMEN AND PELVIS WITH CONTRAST TECHNIQUE: Multidetector CT imaging of the abdomen and pelvis was performed using the standard protocol following bolus administration of intravenous contrast. CONTRAST:  138mL OMNIPAQUE IOHEXOL 300 MG/ML  SOLN COMPARISON:  June 13, 2019. FINDINGS: Lower chest: No acute abnormality. Hepatobiliary: No focal liver abnormality is seen. Status post cholecystectomy. No biliary dilatation. Pancreas: Unremarkable. No pancreatic ductal dilatation or surrounding inflammatory changes. Spleen: Normal in size without focal abnormality. Adrenals/Urinary Tract: Stable left adrenal adenoma. Right adrenal gland appears normal. Small nonobstructive left renal calculus is noted. No hydronephrosis or renal obstruction is noted. No ureteral calculi are noted. Urinary bladder is unremarkable. Stomach/Bowel: The stomach appears normal. There is no evidence of bowel obstruction or inflammation. Status post appendectomy. Vascular/Lymphatic: No significant vascular findings are present. No enlarged abdominal or pelvic lymph nodes. Reproductive: Status post hysterectomy. No adnexal masses. Other: Small fat containing periumbilical hernia is noted. No ascites is noted. Musculoskeletal: No acute or significant osseous findings. IMPRESSION: 1. Stable left adrenal adenoma. 2. Small nonobstructive left renal calculus. No hydronephrosis or renal obstruction is noted. 3. Small fat containing periumbilical hernia. 4. No other abnormality seen in the abdomen or pelvis. Electronically Signed   By: Marijo Conception M.D.   On: 07/08/2020 15:17    Assessment & Plan:  Left adnexal tenderness/pain  Pelvic US  If neg, consider topical EES cream and see if her sx are  related to atrophy of the vagina.    Total face-to-face time with patient was 30 min.  Greater than 50% was spent in counseling and coordination of care with the patient.   Torie Towle L. Harraway-Smith, M.D., [place] June

## 2020-08-04 ENCOUNTER — Encounter: Payer: Self-pay | Admitting: Obstetrics & Gynecology

## 2020-08-04 ENCOUNTER — Telehealth (INDEPENDENT_AMBULATORY_CARE_PROVIDER_SITE_OTHER): Payer: BC Managed Care – PPO | Admitting: Obstetrics & Gynecology

## 2020-08-04 DIAGNOSIS — R301 Vesical tenesmus: Secondary | ICD-10-CM

## 2020-08-04 DIAGNOSIS — N958 Other specified menopausal and perimenopausal disorders: Secondary | ICD-10-CM

## 2020-08-04 DIAGNOSIS — Z86718 Personal history of other venous thrombosis and embolism: Secondary | ICD-10-CM | POA: Diagnosis not present

## 2020-08-04 MED ORDER — AMITRIPTYLINE HCL 10 MG PO TABS: 10.0000 mg | ORAL_TABLET | Freq: Every day | ORAL | 3 refills | Status: DC

## 2020-08-04 NOTE — Patient Instructions (Signed)
Interstitial Cystitis  Interstitial cystitis is inflammation of the bladder. This condition is also known as painful bladder syndrome. This may cause pain in the bladder area as well as a frequent and urgent need to urinate. The bladder is an organ that stores urine after the urine is made in the kidneys. The severity of interstitial cystitis can vary from person to person. You may have flare-ups, and then your symptoms may go away for a while. For many people, it becomes a long-term (chronic) problem. What are the causes? The cause of this condition is not known. What increases the risk? The following factors may make you more likely to develop this condition:  You are female.  You have fibromyalgia.  You have irritable bowel syndrome (IBS).  You have endometriosis. This condition may be aggravated by:  Stress.  Smoking.  Spicy foods. What are the signs or symptoms? Symptoms of interstitial cystitis vary, and they can change over time. Symptoms may include:  Discomfort or pain in the bladder area, which is in the lower abdomen. Pain can range from mild to severe. The pain may change in intensity as the bladder fills with urine or as it empties.  Pain in the pelvic area, between the hip bones.  An urgent need to urinate.  Frequent urination.  Pain during urination.  Pain during sex.  Blood in the urine.  Fatigue. For women, symptoms often get worse during menstruation. How is this diagnosed? This condition is diagnosed based on your symptoms, your medical history, and a physical exam. You may have tests to rule out other conditions, such as:  Urine tests.  Cystoscopy. For this test, a tool similar to a very thin telescope is used to look into your bladder.  Biopsy. This involves taking a sample of tissue from the bladder to be examined under a microscope. How is this treated? There is no cure for this condition, but treatment can help you control your symptoms. Work  closely with your health care provider to find the most effective treatments for you. Treatment options may include:  Medicines to relieve pain and reduce how often you feel the need to urinate. This treatment may include: ? A procedure where a small amount of medicine that eases irritation is put inside your bladder through a catheter (bladder instillation).  Lifestyle changes, such as changing your diet or taking steps to control stress.  Physical therapy. This may include: ? Exercises to help relax the pelvic floor muscles. ? Massage to relax tight muscles (myofascial release).  Learning ways to control when you urinate (bladder training).  Using a device that provides electrical stimulation to your nerves, which can relieve pain (neuromodulation therapy). The device is placed on your back, where it blocks the nerves that cause you to feel pain in your bladder area.  A procedure that stretches your bladder by filling it with air or fluid (hydrodistention).  Surgery. This is rare. It is only done for extreme cases, if other treatments do not help. Follow these instructions at home: Lifestyle  Learn and practice relaxation techniques, such as deep breathing and muscle relaxation.  Get care for your body and mental well-being, such as: ? Cognitive behavioral therapy (CBT). This therapy changes the way you think or act in response to the fatigue. This may help improve how you feel. ? Seeing a mental health therapist to evaluate and treat depression, if necessary.  Work with your health care provider on other ways to manage pain. Acupuncture may   be helpful.  Avoid drinking alcohol.  Do not use any products that contain nicotine or tobacco, such as cigarettes, e-cigarettes, and chewing tobacco. If you need help quitting, ask your health care provider. Eating and drinking  Make dietary changes as recommended by your health care provider. You may need to avoid: ? Spicy foods. ? Foods  that contain a lot of potassium.  Limit your intake of drinks that make you need to urinate. These include: ? Caffeinated drinks like soda, coffee, and tea. ? Alcohol. Bladder training  Use bladder training techniques as directed. Techniques may include: ? Urinating at scheduled times. ? Training yourself to delay urination.  Keep a bladder diary. ? Write down the times that you urinate and any symptoms that you have. This can help you find out which foods, liquids, or activities make your symptoms worse. ? Use your bladder diary to schedule bathroom trips. If you are away from home, plan to be near a bathroom at each of your scheduled times.  Make sure that you urinate just before you leave the house and just before you go to bed.   General instructions  Take over-the-counter and prescription medicines only as told by your health care provider.  You can try a warm or cool compress over your bladder for comfort.  Avoid wearing tight clothing.  Do exercises to relax your pelvic floor muscles as told by your physical therapist.  Keep all follow-up visits as told by your health care provider. This is important. Where to find more information To find more information or a support group near you, visit:  [employer]: urologyhealth.org  Interstitial Cystitis Association: ichelp.org Contact a health care provider if you have:  Symptoms that do not get better with treatment.  Pain or discomfort that gets worse.  More frequent urges to urinate.  A fever. Get help right away if:  You have no control over when you urinate. Summary  Interstitial cystitis is inflammation of the bladder.  This condition may cause pain in the bladder area as well as a frequent and urgent need to urinate.  You may have flare-ups of the condition, and then it may go away for a while. For many people, it becomes a long-term (chronic) problem.  There is no cure for interstitial cystitis,  but treatment methods are available to control your symptoms. This information is not intended to replace advice given to you by your health care provider. Make sure you discuss any questions you have with your health care provider. Document Revised: 07/25/2019 Document Reviewed: 05/02/2017 Elsevier Patient Education  2021 [employer]  

## 2020-08-04 NOTE — Progress Notes (Signed)
GYNECOLOGY VIRTUAL VISIT ENCOUNTER NOTE  Provider location: [facility] [place] [facility]   I connected with [name] on 08/04/20 at  8:15 AM EST by MyChart Video Encounter at home and verified that I am speaking with the correct person using two identifiers.   I discussed the limitations, risks, security and privacy concerns of performing an evaluation and management service virtually and the availability of in person appointments. I also discussed with the patient that there may be a patient responsible charge related to this service. The patient expressed understanding and agreed to proceed.   History:  Diana Schultz is a 55 y.o. G64P2002 female being evaluated today for pelvic pain. Painful bladder. Pt reported today that she had a h/o a DVT with taking OCPs many years ago. This was not on her problem list. She has had a hyst.        Past Medical History:  Diagnosis Date  . Arteriovenous malformation   . Depression   . Diabetes mellitus without complication (HCC)    borderline - no meds  . Diverticulitis   . DVT (deep venous thrombosis) (HCC)    Hx at age 18 blood clot in right leg, d/c birth control pills and no other problems  . Fatty liver   . Gallbladder disease   . Hypertension    history,lost weight, controlled with diet and exercise, no current med  . Lactose intolerance   . Ovarian cyst   . Thalassemia   . Umbilical hernia   . Vaginal delivery 1984, 1989  . Vitamin D deficiency    Past Surgical History:  Procedure Laterality Date  . ABDOMINAL HYSTERECTOMY    . ABLATION  03/23/13   [facility]  . APPENDECTOMY    . CHOLECYSTECTOMY    . DILATION AND CURETTAGE OF UTERUS    . DILATION AND CURETTAGE OF UTERUS N/A 11/01/2013   Procedure: DILATATION AND CURETTAGE;  Surgeon: Mora Bellman, MD;  Location: [facility];  Service: Gynecology;  Laterality: N/A;  . DILITATION & CURRETTAGE/HYSTROSCOPY WITH NOVASURE ABLATION N/A 03/23/2013   Procedure:  DILATATION & CURETTAGE/HYSTEROSCOPY WITH NOVASURE ABLATION;  Surgeon: Lavonia Drafts, MD;  Location: [facility];  Service: Gynecology;  Laterality: N/A;  . LEEP    . TUBAL LIGATION    . VAGINAL HYSTERECTOMY N/A 12/31/2013   Procedure: HYSTERECTOMY VAGINAL;  Surgeon: Lavonia Drafts, MD;  Location: [facility];  Service: Gynecology;  Laterality: N/A;   The following portions of the patient's history were reviewed and updated as appropriate: allergies, current medications, past family history, past medical history, past social history, past surgical history and problem list.    Review of Systems:  Pertinent items noted in HPI and remainder of comprehensive ROS otherwise negative.  Physical Exam:   General:  Alert, oriented and cooperative. Patient appears to be in no acute distress.  Mental Status: Normal mood and affect. Normal behavior. Normal judgment and thought content.   Respiratory: Normal respiratory effort, no problems with respiration noted  Rest of physical exam deferred due to type of encounter  Labs and Imaging Results for orders placed or performed in visit on 08/01/20 (from the past 336 hour(s))  POCT urinalysis dipstick   Collection Time: 08/01/20  9:45 AM  Result Value Ref Range   Color, UA     Clarity, UA clear    Glucose, UA Negative Negative   Bilirubin, UA negative    Ketones, UA negative    Spec Grav, UA 1.020 1.010 -  1.025   Blood, UA negative    pH, UA 6.0 5.0 - 8.0   Protein, UA Negative Negative   Urobilinogen, UA 0.2 0.2 or 1.0 E.U./dL   Nitrite, UA negative    Leukocytes, UA Negative Negative   Appearance     Odor     US PELVIS TRANSVAGINAL NON-OB (TV ONLY)  Result Date: 08/02/2020 CLINICAL DATA:  Generalized pelvic pain. EXAM: ULTRASOUND PELVIS TRANSVAGINAL TECHNIQUE: Transvaginal ultrasound examination of the pelvis was performed including evaluation of the uterus, ovaries, adnexal regions, and pelvic cul-de-sac. COMPARISON:  CT dated July 08, 2020 FINDINGS: Uterus Surgically absent Right ovary Measurements: 2.7 x 1.5 x 2.2 cm = volume: 4.8 mL. Normal appearance/no adnexal mass. Left ovary Measurements: 2.3 x 1.1 x 1.6 cm = volume: 2 mL. Normal appearance/no adnexal mass. Other findings:  No abnormal free fluid IMPRESSION: 1. Status post hysterectomy. 2. Unremarkable appearance of both ovaries. Electronically Signed   By: Constance Holster M.D.   On: 08/02/2020 15:03   CT ABDOMEN PELVIS W CONTRAST  Result Date: 07/08/2020 CLINICAL DATA:  Right lower quadrant abdominal pain. EXAM: CT ABDOMEN AND PELVIS WITH CONTRAST TECHNIQUE: Multidetector CT imaging of the abdomen and pelvis was performed using the standard protocol following bolus administration of intravenous contrast. CONTRAST:  136mL OMNIPAQUE IOHEXOL 300 MG/ML  SOLN COMPARISON:  June 13, 2019. FINDINGS: Lower chest: No acute abnormality. Hepatobiliary: No focal liver abnormality is seen. Status post cholecystectomy. No biliary dilatation. Pancreas: Unremarkable. No pancreatic ductal dilatation or surrounding inflammatory changes. Spleen: Normal in size without focal abnormality. Adrenals/Urinary Tract: Stable left adrenal adenoma. Right adrenal gland appears normal. Small nonobstructive left renal calculus is noted. No hydronephrosis or renal obstruction is noted. No ureteral calculi are noted. Urinary bladder is unremarkable. Stomach/Bowel: The stomach appears normal. There is no evidence of bowel obstruction or inflammation. Status post appendectomy. Vascular/Lymphatic: No significant vascular findings are present. No enlarged abdominal or pelvic lymph nodes. Reproductive: Status post hysterectomy. No adnexal masses. Other: Small fat containing periumbilical hernia is noted. No ascites is noted. Musculoskeletal: No acute or significant osseous findings. IMPRESSION: 1. Stable left adrenal adenoma. 2. Small nonobstructive left renal calculus. No hydronephrosis or renal obstruction is  noted. 3. Small fat containing periumbilical hernia. 4. No other abnormality seen in the abdomen or pelvis. Electronically Signed   By: Marijo Conception M.D.   On: 07/08/2020 15:17       Assessment and Plan:     1. History of DVT of lower extremity Will avoid EES and related drugs  2. Genitourinary syndrome of menopause Reviewed current treatment options. Pt was not comfortable with either of them given her PMhx  3. Painful bladder Will start with Amitriptyline 10mg  q HS Pt will connect with me monthly via MyChart to update me on her progress and we can adjust her meds remotely.  F/u in 3 months or sooner prn       I discussed the assessment and treatment plan with the patient. The patient was provided an opportunity to ask questions and all were answered. The patient agreed with the plan and demonstrated an understanding of the instructions.   The patient was advised to call back or seek an in-person evaluation/go to the ED if the symptoms worsen or if the condition fails to improve as anticipated.  I provided 20 minutes of face-to-face time during this encounter.   Lavonia Drafts, MD [facility], [place]

## 2020-08-11 DIAGNOSIS — L298 Other pruritus: Secondary | ICD-10-CM | POA: Diagnosis not present

## 2020-08-11 DIAGNOSIS — L309 Dermatitis, unspecified: Secondary | ICD-10-CM | POA: Diagnosis not present

## 2021-06-02 DIAGNOSIS — Z1329 Encounter for screening for other suspected endocrine disorder: Secondary | ICD-10-CM | POA: Diagnosis not present

## 2021-06-02 DIAGNOSIS — Z13220 Encounter for screening for lipoid disorders: Secondary | ICD-10-CM | POA: Diagnosis not present

## 2021-06-02 DIAGNOSIS — R7303 Prediabetes: Secondary | ICD-10-CM | POA: Diagnosis not present

## 2021-06-02 DIAGNOSIS — E559 Vitamin D deficiency, unspecified: Secondary | ICD-10-CM | POA: Diagnosis not present

## 2021-06-02 DIAGNOSIS — I10 Essential (primary) hypertension: Secondary | ICD-10-CM | POA: Diagnosis not present

## 2021-06-02 DIAGNOSIS — Z0000 Encounter for general adult medical examination without abnormal findings: Secondary | ICD-10-CM | POA: Diagnosis not present

## 2021-06-04 DIAGNOSIS — E669 Obesity, unspecified: Secondary | ICD-10-CM | POA: Diagnosis not present

## 2021-06-04 DIAGNOSIS — R7303 Prediabetes: Secondary | ICD-10-CM | POA: Diagnosis not present

## 2021-06-04 DIAGNOSIS — D3502 Benign neoplasm of left adrenal gland: Secondary | ICD-10-CM | POA: Diagnosis not present

## 2021-06-04 DIAGNOSIS — I10 Essential (primary) hypertension: Secondary | ICD-10-CM | POA: Diagnosis not present

## 2021-06-05 DIAGNOSIS — Z0289 Encounter for other administrative examinations: Secondary | ICD-10-CM

## 2021-06-05 DIAGNOSIS — D3502 Benign neoplasm of left adrenal gland: Secondary | ICD-10-CM | POA: Diagnosis not present

## 2021-06-17 DIAGNOSIS — I10 Essential (primary) hypertension: Secondary | ICD-10-CM | POA: Diagnosis not present

## 2021-06-17 DIAGNOSIS — R7303 Prediabetes: Secondary | ICD-10-CM | POA: Diagnosis not present

## 2021-06-17 DIAGNOSIS — Z0000 Encounter for general adult medical examination without abnormal findings: Secondary | ICD-10-CM | POA: Diagnosis not present

## 2021-06-17 DIAGNOSIS — Z713 Dietary counseling and surveillance: Secondary | ICD-10-CM | POA: Diagnosis not present

## 2021-06-18 ENCOUNTER — Other Ambulatory Visit (HOSPITAL_BASED_OUTPATIENT_CLINIC_OR_DEPARTMENT_OTHER): Payer: Self-pay

## 2021-06-18 MED ORDER — MOUNJARO 2.5 MG/0.5ML ~~LOC~~ SOAJ
2.5000 mg | SUBCUTANEOUS | 2 refills | Status: DC
  Filled 2021-06-18: qty 2, 28d supply, fill #0

## 2021-06-18 MED ORDER — SEMAGLUTIDE-WEIGHT MANAGEMENT 0.25 MG/0.5ML ~~LOC~~ SOAJ
0.2500 mg | SUBCUTANEOUS | 2 refills | Status: DC
  Filled 2021-06-18 – 2021-06-23 (×2): qty 2, 28d supply, fill #0
  Filled 2021-06-25: qty 2, 30d supply, fill #0
  Filled 2021-06-25: qty 2, 28d supply, fill #0

## 2021-06-19 ENCOUNTER — Other Ambulatory Visit (HOSPITAL_BASED_OUTPATIENT_CLINIC_OR_DEPARTMENT_OTHER): Payer: Self-pay

## 2021-06-19 DIAGNOSIS — Z1231 Encounter for screening mammogram for malignant neoplasm of breast: Secondary | ICD-10-CM | POA: Diagnosis not present

## 2021-06-19 MED ORDER — MOUNJARO 2.5 MG/0.5ML ~~LOC~~ SOAJ
SUBCUTANEOUS | 1 refills | Status: DC
Start: 2021-06-19 — End: 2021-07-08
  Filled 2021-06-19 – 2021-06-29 (×6): qty 2, 28d supply, fill #0

## 2021-06-22 ENCOUNTER — Other Ambulatory Visit (HOSPITAL_BASED_OUTPATIENT_CLINIC_OR_DEPARTMENT_OTHER): Payer: Self-pay

## 2021-06-23 ENCOUNTER — Other Ambulatory Visit (HOSPITAL_BASED_OUTPATIENT_CLINIC_OR_DEPARTMENT_OTHER): Payer: Self-pay

## 2021-06-25 ENCOUNTER — Other Ambulatory Visit (HOSPITAL_BASED_OUTPATIENT_CLINIC_OR_DEPARTMENT_OTHER): Payer: Self-pay

## 2021-06-25 MED ORDER — SEMAGLUTIDE-WEIGHT MANAGEMENT 0.25 MG/0.5ML ~~LOC~~ SOAJ
SUBCUTANEOUS | 0 refills | Status: DC
  Filled 2021-06-25: qty 2, 28d supply, fill #0

## 2021-06-29 ENCOUNTER — Other Ambulatory Visit (HOSPITAL_BASED_OUTPATIENT_CLINIC_OR_DEPARTMENT_OTHER): Payer: Self-pay

## 2021-07-08 ENCOUNTER — Encounter (INDEPENDENT_AMBULATORY_CARE_PROVIDER_SITE_OTHER): Payer: Self-pay | Admitting: Family Medicine

## 2021-07-08 ENCOUNTER — Ambulatory Visit (INDEPENDENT_AMBULATORY_CARE_PROVIDER_SITE_OTHER): Payer: BC Managed Care – PPO | Admitting: Family Medicine

## 2021-07-08 ENCOUNTER — Other Ambulatory Visit: Payer: Self-pay

## 2021-07-08 ENCOUNTER — Other Ambulatory Visit (HOSPITAL_BASED_OUTPATIENT_CLINIC_OR_DEPARTMENT_OTHER): Payer: Self-pay

## 2021-07-08 VITALS — BP 160/90 | HR 58 | Temp 97.9°F | Ht 63.0 in | Wt 311.0 lb

## 2021-07-08 DIAGNOSIS — E1169 Type 2 diabetes mellitus with other specified complication: Secondary | ICD-10-CM | POA: Diagnosis not present

## 2021-07-08 DIAGNOSIS — I10 Essential (primary) hypertension: Secondary | ICD-10-CM

## 2021-07-08 DIAGNOSIS — D3502 Benign neoplasm of left adrenal gland: Secondary | ICD-10-CM

## 2021-07-08 DIAGNOSIS — R718 Other abnormality of red blood cells: Secondary | ICD-10-CM

## 2021-07-08 DIAGNOSIS — R0602 Shortness of breath: Secondary | ICD-10-CM

## 2021-07-08 DIAGNOSIS — E65 Localized adiposity: Secondary | ICD-10-CM

## 2021-07-08 DIAGNOSIS — E559 Vitamin D deficiency, unspecified: Secondary | ICD-10-CM

## 2021-07-08 DIAGNOSIS — G4709 Other insomnia: Secondary | ICD-10-CM

## 2021-07-08 DIAGNOSIS — E668 Other obesity: Secondary | ICD-10-CM

## 2021-07-08 DIAGNOSIS — R5383 Other fatigue: Secondary | ICD-10-CM | POA: Diagnosis not present

## 2021-07-08 DIAGNOSIS — Z6841 Body Mass Index (BMI) 40.0 and over, adult: Secondary | ICD-10-CM

## 2021-07-08 DIAGNOSIS — R0683 Snoring: Secondary | ICD-10-CM

## 2021-07-08 DIAGNOSIS — F3289 Other specified depressive episodes: Secondary | ICD-10-CM

## 2021-07-08 MED ORDER — TRAZODONE HCL 50 MG PO TABS
25.0000 mg | ORAL_TABLET | Freq: Every evening | ORAL | 0 refills | Status: DC | PRN
  Filled 2021-07-08: qty 30, 30d supply, fill #0

## 2021-07-08 MED ORDER — OZEMPIC (0.25 OR 0.5 MG/DOSE) 2 MG/1.5ML ~~LOC~~ SOPN
0.5000 mg | PEN_INJECTOR | SUBCUTANEOUS | 0 refills | Status: DC
  Filled 2021-07-08 – 2021-07-13 (×3): qty 1.5, 28d supply, fill #0

## 2021-07-08 NOTE — Progress Notes (Signed)
Chief Complaint:   OBESITY Diana Schultz (MR# [id number]) is a 56 y.o. female who presents for evaluation and treatment of obesity and related comorbidities. Current BMI is Body mass index is 55.09 kg/m. Diana Schultz has been struggling with her weight for many years and has been unsuccessful in either losing weight, maintaining weight loss, or reaching her healthy weight goal.  Diana Schultz is currently in the action stage of change and ready to dedicate time achieving and maintaining a healthier weight. Diana Schultz is interested in becoming our patient and working on intensive lifestyle modifications including (but not limited to) diet and exercise for weight loss.  Diana Schultz says when she did meal prep, she lost 20 pounds. She says she found [place]. [employer].  Her sleep has been interrupted.  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  [name]'s habits were reviewed today and are as follows: she thinks her family will eat healthier with her, her desired weight loss is 174 pounds, she has been heavy most of her life, she started gaining weight after having her second child, her heaviest weight ever was 311 pounds, she is a picky eater and doesn't like to eat healthier foods, she craves carbs and sweets, she snacks frequently in the evenings, she skips meals frequently, she is frequently drinking liquids with calories, she frequently makes poor food choices, she frequently eats larger portions than normal, and she struggles with emotional eating.  Depression Screen Diana Schultz's Food and Mood (modified PHQ-9) score was 9.  Depression screen PHQ 2/9 07/08/2021  Decreased Interest 1  Down, Depressed, Hopeless 1  PHQ - 2 Score 2  Altered sleeping 2  Tired, decreased energy 1  Change in appetite 2  Feeling bad or failure about yourself  2  Trouble concentrating 0  Moving slowly or fidgety/restless 0  Suicidal thoughts 0  PHQ-9 Score 9  Difficult doing work/chores Not difficult at all    Assessment/Plan:   1. Other fatigue Diana Schultz admits to daytime somnolence and reports waking up still tired. Patent has a history of symptoms of daytime fatigue, morning fatigue, morning headache, and snoring. Diana Schultz generally gets 8 hours of sleep per night, and states that she has generally restful sleep. Snoring is present. Apneic episodes are not present. Epworth Sleepiness Score is 6.  [name] does feel that her weight is causing her energy to be lower than it should be. Fatigue may be related to obesity, depression or many other causes. Labs will be ordered, and in the meanwhile, [name] will focus on self care including making healthy food choices, increasing physical activity and focusing on stress reduction.  - EKG 12-Lead  2. SOB (shortness of breath) on exertion Diana Schultz notes increasing shortness of breath with exercising and seems to be worsening over time with weight gain. She notes getting out of breath sooner with activity than she used to. This has gotten worse recently. Diana Schultz denies shortness of breath at rest or orthopnea.  [name] does feel that she gets out of breath more easily that she used to when she exercises. Diana Schultz's shortness of breath appears to be obesity related and exercise induced. She has agreed to work on weight loss and gradually increase exercise to treat her exercise induced shortness of breath. Will continue to monitor closely.  3. Vitamin D deficiency Not at goal.   Plan: Follow-up for routine testing of Vitamin D, at least 2-3 times per year to avoid over-replacement.  Lab Results  Component Value Date   VD25OH 25.7 (L) 09/11/2019   4. Microcytic red blood cells She saw Hematology and says that everything is okay.  5. Type 2 diabetes mellitus with other specified complication, without long-term current use of insulin ([place]) Diabetes Mellitus:  Per PCP chart. Not at goal.  Medication: Ozempic 0.5 mg subcutaneously weekly. Issues reviewed: blood sugar goals,  complications of diabetes mellitus, hypoglycemia prevention and treatment, exercise, and nutrition.  Plan: Continue Ozempic 0.5 mg subcutaneously weekly.  Will refill today.  The patient will continue to focus on protein-rich, low simple carbohydrate foods. We reviewed the importance of hydration, regular exercise for stress reduction, and restorative sleep.   Lab Results  Component Value Date   HGBA1C 6.0 (H) 09/11/2019   HGBA1C 6.0 03/08/2016   HGBA1C 5.9 08/18/2015   Lab Results  Component Value Date   LDLCALC 84 09/11/2019   CREATININE 0.66 07/08/2020   - Refill Semaglutide,0.25 or 0.5MG /DOS, (OZEMPIC, 0.25 OR 0.5 MG/DOSE,) 2 MG/1.5ML SOPN; Inject 0.5 mg into the skin once a week.  Dispense: 1.5 mL; Refill: 0  6. Adrenal adenoma, left She is being followed by Endocrinology.   7. Essential hypertension Labile now, but improves with meal prep/increased water. Medications: None.  The only one that helped was spironolactone 50, due to side effects.  Plan: Avoid buying foods that are: processed, frozen, or prepackaged to avoid excess salt. We will watch for signs of hypotension as she continues lifestyle modifications.  BP Readings from Last 3 Encounters:  07/08/21 (!) 160/90  08/01/20 (!) 146/79  07/08/20 138/61   Lab Results  Component Value Date   CREATININE 0.66 07/08/2020   8. Visceral obesity Current visceral fat rating: 24. Visceral fat rating goal is < 13. Visceral adipose tissue is a hormonally active component of total body fat. This body composition phenotype is associated with medical disorders such as metabolic syndrome, cardiovascular disease, and several malignancies including prostate, breast, and colorectal cancers. Starting goal: Lose 7-10% of starting weight.   9. Snores She does not have OSA.  She had a sleep study.  She endorses snoring and morning headaches.  Epworth sleep score is 6.  10. Other insomnia This is poorly controlled. Dysfunction: Interrupted  sleep.  Current treatment: None.  Plan: Start trazodone 50 mg 1/2-1 tablet at bedtime as needed for sleep. Recommend sleep hygiene measures including regular sleep schedule, optimal sleep environment, and relaxing presleep rituals.   11. Other depression, with emotional eating Not at goal. Medication: None.   Plan: Discussed cues and consequences, how thoughts affect eating, model of thoughts, feelings, and behaviors, and strategies for change by focusing on the cue. Discussed cognitive distortions, coping thoughts, and how to change your thoughts.  12. Class 3 severe obesity with serious comorbidity and body mass index (BMI) of 50.0 to 59.9 in adult, unspecified obesity type ([place])  Rissa is currently in the action stage of change and her goal is to continue with weight loss efforts. I recommend Rever begin the structured treatment plan as follows:  She has agreed to a meal prep service.  Exercise goals:  Increase NEAT.    Behavioral modification strategies: increasing lean protein intake, decreasing simple carbohydrates, increasing vegetables, and increasing water intake.  She was informed of the importance of frequent follow-up visits to maximize her success with intensive lifestyle modifications for her multiple health conditions. She was informed we would discuss her lab results at her next visit unless there is a critical issue that needs to  be addressed sooner. Kaly agreed to keep her next visit at the agreed upon time to discuss these results.  Objective:   Blood pressure (!) 160/90, pulse (!) 58, temperature 97.9 F (36.6 C), temperature source Oral, height 5\' 3"  (1.6 m), weight (!) 311 lb (141.1 kg), last menstrual period 12/01/2013, SpO2 97 %. Body mass index is 55.09 kg/m.  EKG: Normal sinus rhythm, rate 67 bpm.  Indirect Calorimeter completed today shows a VO2 of 275 and a REE of 1901.  Her calculated basal metabolic rate is 2641 thus her basal metabolic rate is worse than  expected.  General: Cooperative, alert, well developed, in no acute distress. HEENT: Conjunctivae and lids unremarkable. Cardiovascular: Regular rhythm.  Lungs: Normal work of breathing. Neurologic: No focal deficits.   Lab Results  Component Value Date   CREATININE 0.66 07/08/2020   BUN 17 07/08/2020   NA 137 07/08/2020   K 3.7 07/08/2020   CL 107 07/08/2020   CO2 23 07/08/2020   Lab Results  Component Value Date   ALT 21 07/08/2020   AST 17 07/08/2020   ALKPHOS 61 07/08/2020   BILITOT 0.5 07/08/2020   Lab Results  Component Value Date   HGBA1C 6.0 (H) 09/11/2019   HGBA1C 6.0 03/08/2016   HGBA1C 5.9 08/18/2015   HGBA1C 5.9 03/03/2015   HGBA1C 6.0 10/03/2014   Lab Results  Component Value Date   INSULIN 26.1 (H) 09/11/2019   Lab Results  Component Value Date   TSH 2.140 09/11/2019   Lab Results  Component Value Date   CHOL 158 09/11/2019   HDL 58 09/11/2019   LDLCALC 84 09/11/2019   TRIG 87 09/11/2019   CHOLHDL 2.7 09/11/2019   Lab Results  Component Value Date   VD25OH 25.7 (L) 09/11/2019   Lab Results  Component Value Date   WBC 9.9 07/08/2020   HGB 13.8 07/08/2020   HCT 43.3 07/08/2020   MCV 76.2 (L) 07/08/2020   PLT 250 07/08/2020   Lab Results  Component Value Date   IRON 48 09/11/2019   TIBC 282 09/11/2019   FERRITIN 363 (H) 09/11/2019   Attestation Statements:   This is the patient's first visit at [facility]. The patient's NEW PATIENT PACKET was reviewed at length. Included in the packet: current and past health history, medications, allergies, ROS, gynecologic history (women only), surgical history, family history, social history, weight history, weight loss surgery history (for those that have had weight loss surgery), nutritional evaluation, mood and food questionnaire, PHQ9, Epworth questionnaire, sleep habits questionnaire, patient life and health improvement goals questionnaire. These will all be scanned into the  patient's chart under media.   During the visit, I independently reviewed the patient's EKG, bioimpedance scale results, and indirect calorimeter results. I used this information to tailor a meal plan for the patient that will help her to lose weight and will improve her obesity-related conditions going forward. I performed a medically necessary appropriate examination and/or evaluation. I discussed the assessment and treatment plan with the patient. The patient was provided an opportunity to ask questions and all were answered. The patient agreed with the plan and demonstrated an understanding of the instructions. Labs were ordered at this visit and will be reviewed at the next visit unless more critical results need to be addressed immediately. Clinical information was updated and documented in the EMR.   Time spent on visit including pre-visit chart review and post-visit care and documentation was 44 minutes. Time was spent on:  Food choices and timing of food intake reviewed today. I discussed a personalized meal plan with the patient that will help her to lose weight and will improve her obesity-related conditions going forward. I performed a medically necessary appropriate examination and/or evaluation. I discussed the assessment and treatment plan with the patient. Motivational interviewing as well as evidence-based interventions for health behavior change were utilized today including the discussion of self monitoring techniques, problem-solving barriers and SMART goal setting techniques.  An exercise prescription was reviewed.  The patient was provided an opportunity to ask questions and all were answered. The patient agreed with the plan and demonstrated an understanding of the instructions. Clinical information was updated and documented in the EMR.  I, Water quality scientist, CMA, am acting as transcriptionist for Briscoe Deutscher, DO  I have reviewed the above documentation for accuracy and completeness, and I  agree with the above. - Briscoe Deutscher, DO, MS, FAAFP, DABOM - Family and Bariatric Medicine.

## 2021-07-09 ENCOUNTER — Encounter (INDEPENDENT_AMBULATORY_CARE_PROVIDER_SITE_OTHER): Payer: Self-pay | Admitting: Family Medicine

## 2021-07-09 ENCOUNTER — Other Ambulatory Visit (HOSPITAL_BASED_OUTPATIENT_CLINIC_OR_DEPARTMENT_OTHER): Payer: Self-pay

## 2021-07-09 NOTE — Telephone Encounter (Signed)
See other mychart message.

## 2021-07-10 ENCOUNTER — Other Ambulatory Visit (HOSPITAL_BASED_OUTPATIENT_CLINIC_OR_DEPARTMENT_OTHER): Payer: Self-pay

## 2021-07-13 ENCOUNTER — Other Ambulatory Visit (HOSPITAL_BASED_OUTPATIENT_CLINIC_OR_DEPARTMENT_OTHER): Payer: Self-pay

## 2021-07-13 ENCOUNTER — Encounter (INDEPENDENT_AMBULATORY_CARE_PROVIDER_SITE_OTHER): Payer: Self-pay | Admitting: Family Medicine

## 2021-07-14 ENCOUNTER — Telehealth (INDEPENDENT_AMBULATORY_CARE_PROVIDER_SITE_OTHER): Payer: Self-pay | Admitting: Family Medicine

## 2021-07-14 NOTE — Telephone Encounter (Signed)
Dr.Wallace °

## 2021-07-14 NOTE — Telephone Encounter (Signed)
Spoke with pt and she is very concerned about being about to get her DM medication due to cost. OV schedule for tomorrow at 3:20

## 2021-07-14 NOTE — Telephone Encounter (Signed)
Patient wants to see Dr. Juleen China today if possible. Pt wants to be worked in.

## 2021-07-15 ENCOUNTER — Encounter (INDEPENDENT_AMBULATORY_CARE_PROVIDER_SITE_OTHER): Payer: Self-pay | Admitting: Family Medicine

## 2021-07-15 ENCOUNTER — Ambulatory Visit (INDEPENDENT_AMBULATORY_CARE_PROVIDER_SITE_OTHER): Payer: BC Managed Care – PPO | Admitting: Family Medicine

## 2021-07-15 ENCOUNTER — Other Ambulatory Visit: Payer: Self-pay

## 2021-07-15 VITALS — BP 165/88 | HR 62 | Temp 98.0°F | Ht 63.0 in | Wt 310.0 lb

## 2021-07-15 DIAGNOSIS — E669 Obesity, unspecified: Secondary | ICD-10-CM | POA: Diagnosis not present

## 2021-07-15 DIAGNOSIS — E1169 Type 2 diabetes mellitus with other specified complication: Secondary | ICD-10-CM | POA: Diagnosis not present

## 2021-07-15 DIAGNOSIS — Z6841 Body Mass Index (BMI) 40.0 and over, adult: Secondary | ICD-10-CM

## 2021-07-16 ENCOUNTER — Encounter (INDEPENDENT_AMBULATORY_CARE_PROVIDER_SITE_OTHER): Payer: Self-pay

## 2021-07-16 MED ORDER — OZEMPIC (1 MG/DOSE) 4 MG/3ML ~~LOC~~ SOPN: 1.0000 mg | PEN_INJECTOR | SUBCUTANEOUS | 0 refills | Status: DC

## 2021-07-17 ENCOUNTER — Other Ambulatory Visit (HOSPITAL_BASED_OUTPATIENT_CLINIC_OR_DEPARTMENT_OTHER): Payer: Self-pay

## 2021-07-17 MED ORDER — SEMAGLUTIDE-WEIGHT MANAGEMENT 0.25 MG/0.5ML ~~LOC~~ SOAJ
0.2500 mg | SUBCUTANEOUS | 5 refills | Status: DC
  Filled 2021-07-17 – 2021-08-31 (×3): qty 2, 28d supply, fill #0

## 2021-07-20 ENCOUNTER — Encounter (INDEPENDENT_AMBULATORY_CARE_PROVIDER_SITE_OTHER): Payer: Self-pay | Admitting: Family Medicine

## 2021-07-20 ENCOUNTER — Other Ambulatory Visit (HOSPITAL_BASED_OUTPATIENT_CLINIC_OR_DEPARTMENT_OTHER): Payer: Self-pay

## 2021-07-20 NOTE — Progress Notes (Signed)
Chief Complaint:   OBESITY Diana Schultz is here to discuss her progress with her obesity treatment plan along with follow-up of her obesity related diagnoses. See Medical Weight Management Flowsheet for complete bioelectrical impedance results.  Today's visit was #: 2 Starting weight: 311 lbs Starting date: 07/08/2021 Weight change since last visit: 1 lb Total lbs lost to date: 1 lb Total weight loss percentage to date: -0.32%  Nutrition Plan: Meal prep service for 100% of the time. Activity: Increased walking. Anti-obesity medications: Ozempic 0.25 mg subcutaneously weekly. Reported side effects: None.  Interim History: [name] will need to meet her $2500 deductible before Ozempic will even be partially covered.  Assessment/Plan:   1. Type 2 diabetes mellitus with other specified complication, without long-term current use of insulin (HCC) Diabetes Mellitus: Not at goal. Medication: Ozempic 0.25 mg subcutaneously weekly. Issues reviewed: blood sugar goals, complications of diabetes mellitus, hypoglycemia prevention and treatment, exercise, and nutrition.  Plan:  Increase Ozempic to 1 mg subcutaneously weekly. The patient will continue to focus on protein-rich, low simple carbohydrate foods. We reviewed the importance of hydration, regular exercise for stress reduction, and restorative sleep.   Lab Results  Component Value Date   HGBA1C 6.0 (H) 09/11/2019   HGBA1C 6.0 03/08/2016   HGBA1C 5.9 08/18/2015   Lab Results  Component Value Date   LDLCALC 84 09/11/2019   CREATININE 0.66 07/08/2020   - Increase Semaglutide, 1 MG/DOSE, (OZEMPIC, 1 MG/DOSE,) 4 MG/3ML SOPN; Inject 1 mg into the skin once a week.  Dispense: 3 mL; Refill: 0  2. Obesity, current BMI 54.9  Course: Pavielle is currently in the action stage of change. As such, her goal is to continue with weight loss efforts.   Nutrition goals: She has agreed to using a meal prep service.   Exercise goals:  As is.  Behavioral  modification strategies: increasing lean protein intake, decreasing simple carbohydrates, increasing vegetables, and increasing water intake.  [name] has agreed to follow-up with our clinic in 2 weeks. She was informed of the importance of frequent follow-up visits to maximize her success with intensive lifestyle modifications for her multiple health conditions.   Objective:   Blood pressure (!) 165/88, pulse 62, temperature 98 F (36.7 C), temperature source Oral, height 5\' 3"  (1.6 m), weight (!) 310 lb (140.6 kg), last menstrual period 12/01/2013, SpO2 98 %. Body mass index is 54.91 kg/m.  General: Cooperative, alert, well developed, in no acute distress. HEENT: Conjunctivae and lids unremarkable. Cardiovascular: Regular rhythm.  Lungs: Normal work of breathing. Neurologic: No focal deficits.   Lab Results  Component Value Date   CREATININE 0.66 07/08/2020   BUN 17 07/08/2020   NA 137 07/08/2020   K 3.7 07/08/2020   CL 107 07/08/2020   CO2 23 07/08/2020   Lab Results  Component Value Date   ALT 21 07/08/2020   AST 17 07/08/2020   ALKPHOS 61 07/08/2020   BILITOT 0.5 07/08/2020   Lab Results  Component Value Date   HGBA1C 6.0 (H) 09/11/2019   HGBA1C 6.0 03/08/2016   HGBA1C 5.9 08/18/2015   HGBA1C 5.9 03/03/2015   HGBA1C 6.0 10/03/2014   Lab Results  Component Value Date   INSULIN 26.1 (H) 09/11/2019   Lab Results  Component Value Date   TSH 2.140 09/11/2019   Lab Results  Component Value Date   CHOL 158 09/11/2019   HDL 58 09/11/2019   LDLCALC 84 09/11/2019   TRIG 87 09/11/2019   CHOLHDL 2.7 09/11/2019  Lab Results  Component Value Date   VD25OH 25.7 (L) 09/11/2019   Lab Results  Component Value Date   WBC 9.9 07/08/2020   HGB 13.8 07/08/2020   HCT 43.3 07/08/2020   MCV 76.2 (L) 07/08/2020   PLT 250 07/08/2020   Lab Results  Component Value Date   IRON 48 09/11/2019   TIBC 282 09/11/2019   FERRITIN 363 (H) 09/11/2019   Attestation Statements:    Reviewed by clinician on day of visit: allergies, medications, problem list, medical history, surgical history, family history, social history, and previous encounter notes.  I, Water quality scientist, CMA, am acting as transcriptionist for Briscoe Deutscher, DO  I have reviewed the above documentation for accuracy and completeness, and I agree with the above. -  Briscoe Deutscher, DO, MS, FAAFP, DABOM - Family and Bariatric Medicine.

## 2021-07-21 ENCOUNTER — Emergency Department (HOSPITAL_BASED_OUTPATIENT_CLINIC_OR_DEPARTMENT_OTHER)
Admission: EM | Admit: 2021-07-21 | Discharge: 2021-07-21 | Disposition: A | Discharge status: Home / Self Care | Payer: BC Managed Care – PPO | Attending: Emergency Medicine | Admitting: Emergency Medicine

## 2021-07-21 ENCOUNTER — Emergency Department (HOSPITAL_BASED_OUTPATIENT_CLINIC_OR_DEPARTMENT_OTHER): Payer: BC Managed Care – PPO

## 2021-07-21 ENCOUNTER — Other Ambulatory Visit: Payer: Self-pay

## 2021-07-21 ENCOUNTER — Other Ambulatory Visit (HOSPITAL_BASED_OUTPATIENT_CLINIC_OR_DEPARTMENT_OTHER): Payer: Self-pay

## 2021-07-21 ENCOUNTER — Encounter (HOSPITAL_BASED_OUTPATIENT_CLINIC_OR_DEPARTMENT_OTHER): Payer: Self-pay

## 2021-07-21 DIAGNOSIS — R0789 Other chest pain: Secondary | ICD-10-CM | POA: Diagnosis not present

## 2021-07-21 DIAGNOSIS — R079 Chest pain, unspecified: Secondary | ICD-10-CM | POA: Diagnosis not present

## 2021-07-21 DIAGNOSIS — I10 Essential (primary) hypertension: Secondary | ICD-10-CM | POA: Diagnosis not present

## 2021-07-21 DIAGNOSIS — R059 Cough, unspecified: Secondary | ICD-10-CM | POA: Diagnosis not present

## 2021-07-21 LAB — CBC
HCT: 40.6 % (ref 36.0–46.0)
Hemoglobin: 12.9 g/dL (ref 12.0–15.0)
MCH: 24.4 pg — ABNORMAL LOW (ref 26.0–34.0)
MCHC: 31.8 g/dL (ref 30.0–36.0)
MCV: 76.7 fL — ABNORMAL LOW (ref 80.0–100.0)
Platelets: 218 10*3/uL (ref 150–400)
RBC: 5.29 MIL/uL — ABNORMAL HIGH (ref 3.87–5.11)
RDW: 16.3 % — ABNORMAL HIGH (ref 11.5–15.5)
WBC: 9.6 10*3/uL (ref 4.0–10.5)
nRBC: 0 % (ref 0.0–0.2)

## 2021-07-21 LAB — BASIC METABOLIC PANEL
Anion gap: 8 (ref 5–15)
BUN: 19 mg/dL (ref 6–20)
CO2: 28 mmol/L (ref 22–32)
Calcium: 9.3 mg/dL (ref 8.9–10.3)
Chloride: 104 mmol/L (ref 98–111)
Creatinine, Ser: 1.08 mg/dL — ABNORMAL HIGH (ref 0.44–1.00)
GFR, Estimated: 60 mL/min — ABNORMAL LOW (ref >60)
Glucose, Bld: 110 mg/dL — ABNORMAL HIGH (ref 70–99)
Potassium: 4.3 mmol/L (ref 3.5–5.1)
Sodium: 140 mmol/L (ref 135–145)

## 2021-07-21 LAB — TROPONIN I (HIGH SENSITIVITY)
Troponin I (High Sensitivity): 8 ng/L (ref <18)
Troponin I (High Sensitivity): 7 ng/L (ref <18)

## 2021-07-21 LAB — D-DIMER, QUANTITATIVE: D-Dimer, Quant: <0.27 ug/mL-FEU (ref 0.00–0.50)

## 2021-07-21 NOTE — ED Triage Notes (Addendum)
Pt c/o chest tightness x 1 hour-NAD-steady gait-dry cough noted-states started today

## 2021-07-21 NOTE — Telephone Encounter (Signed)
Dr.Wallace °

## 2021-07-21 NOTE — ED Notes (Signed)
Patient discharged to home.  All discharge instructions reviewed.  Patient verbalized understanding via teachback method.  VS WDL.  Respirations even and unlabored.  Ambulatory out of ED.   °

## 2021-07-21 NOTE — Discharge Instructions (Addendum)
Please use Tylenol or ibuprofen for pain.  You may use 600 mg ibuprofen every 6 hours or 1000 mg of Tylenol every 6 hours.  You may choose to alternate between the 2.  This would be most effective.  Not to exceed 4 g of Tylenol within 24 hours.  Not to exceed 3200 mg ibuprofen 24 hours.  As we discussed there is no evidence of heart attack, blood clot, pneumonia, or other abnormalities of your heart or lungs on your evaluation today.  Most common causes of chest pain that you are experiencing related to your muscles or bones in your chest, should resolve with anti-inflammatory, pain medication, as well as time, rest.  Additionally I recommend is continue take your acid reflux medications, watching your diet.  If you have persistent chest pain, I recommend following up with your cardiologist.  If you have worsening chest pain, shortness of breath please return to the emergency department.

## 2021-07-22 ENCOUNTER — Encounter (INDEPENDENT_AMBULATORY_CARE_PROVIDER_SITE_OTHER): Payer: Self-pay | Admitting: Family Medicine

## 2021-07-22 ENCOUNTER — Other Ambulatory Visit (HOSPITAL_BASED_OUTPATIENT_CLINIC_OR_DEPARTMENT_OTHER): Payer: Self-pay

## 2021-07-22 NOTE — ED Provider Notes (Signed)
[facility] Provider Note   CSN: [id number] Arrival date & time: 07/21/21  1913     History  Chief Complaint  Patient presents with   Chest Pain    Diana Schultz is a 56 y.o. female This patient with past medical history significant for hypertension, obesity, acid reflux, and AVM of the lung with persistent dry hacking cough who presents with 4 hours of central chest pressure/pain, as well as feeling of chest pain radiating like a band around her chest and into her back.  Patient denies any nausea, vomiting, abdominal pain, does endorse some mild shortness of breath.  Patient denies any diaphoresis, numbness or tingling, radiation to arms or neck.  Patient has had significant previous cardiac work-up secondary to absent T waves found on EKG in 2020.  Patient with no prior history of ACS, she does endorse some prediabetes.  No first-degree family history of ACS.  Patient still with some ongoing pain at this time, reports it is replicable with pressure to the chest.  Additionally patient denies any recent travel, history of blood clots, pain in the legs, use of hormonal supplementation, significant smoking history.   Chest Pain     Home Medications Prior to Admission medications   Medication Sig Start Date End Date Taking? Authorizing Provider  Semaglutide, 1 MG/DOSE, (OZEMPIC, 1 MG/DOSE,) 4 MG/3ML SOPN Inject 1 mg into the skin once a week. 07/16/21   Briscoe Deutscher, DO  Semaglutide-Weight Management 0.25 MG/0.5ML SOAJ Inject 0.25 mg into the skin every 7 (seven) days. 07/17/21     traZODone (DESYREL) 50 MG tablet Take 0.5-1 tablets (25-50 mg total) by mouth at bedtime as needed for sleep. 07/08/21   Briscoe Deutscher, DO      Allergies    Amlodipine, Metoprolol, Benicar [olmesartan], Hydrochlorothiazide, Lactose intolerance (gi), Lisinopril, Nebivolol, and Semaglutide(0.25 or 0.5mg -dos)    Review of Systems   Review of Systems  Cardiovascular:  Positive for  chest pain.  All other systems reviewed and are negative.  Physical Exam Updated Vital Signs BP (!) 147/93    Pulse 71    Temp 98.3 F (36.8 C) (Oral)    Resp 14    Ht 5\' 3"  (1.6 m)    Wt (!) 143.3 kg    LMP 12/01/2013 Comment: hx ablation 03/23/2013   SpO2 100%    BMI 55.98 kg/m  Physical Exam Vitals and nursing note reviewed.  Constitutional:      General: She is not in acute distress.    Appearance: Normal appearance. She is obese.  HENT:     Head: Normocephalic and atraumatic.  Eyes:     General:        Right eye: No discharge.        Left eye: No discharge.  Cardiovascular:     Rate and Rhythm: Normal rate and regular rhythm.     Heart sounds: No murmur heard.   No friction rub. No gallop.  Pulmonary:     Effort: Pulmonary effort is normal.     Breath sounds: Normal breath sounds.  Chest:     Comments: Chest pain is replicated with pressure to the central chest.  There is no abnormality noted on the chest wall.  No step-off or deformity noted on sternum, ribs. Abdominal:     General: Bowel sounds are normal.     Palpations: Abdomen is soft.  Skin:    General: Skin is warm and dry.     Capillary Refill:  Capillary refill takes less than 2 seconds.  Neurological:     Mental Status: She is alert and oriented to person, place, and time.  Psychiatric:        Mood and Affect: Mood normal.        Behavior: Behavior normal.    ED Results / Procedures / Treatments   Labs (all labs ordered are listed, but only abnormal results are displayed) Labs Reviewed  BASIC METABOLIC PANEL - Abnormal; Notable for the following components:      Result Value   Glucose, Bld 110 (*)    Creatinine, Ser 1.08 (*)    GFR, Estimated 60 (*)    All other components within normal limits  CBC - Abnormal; Notable for the following components:   RBC 5.29 (*)    MCV 76.7 (*)    MCH 24.4 (*)    RDW 16.3 (*)    All other components within normal limits  D-DIMER, QUANTITATIVE  TROPONIN I (HIGH  SENSITIVITY)  TROPONIN I (HIGH SENSITIVITY)    EKG EKG Interpretation  Date/Time:  Tuesday July 21 2021 19:21:38 EST Ventricular Rate:  75 PR Interval:  154 QRS Duration: 82 QT Interval:  396 QTC Calculation: 442 R Axis:   -8 Text Interpretation: Normal sinus rhythm with sinus arrhythmia  nonspecific T waves similar to 2020 Confirmed by Sherwood Gambler [phone]) on 07/21/2021 7:25:26 PM  Radiology DG Chest Portable 1 View  Result Date: 07/21/2021 CLINICAL DATA:  Cough and chest tightness for 1 hour, initial encounter EXAM: PORTABLE CHEST 1 VIEW COMPARISON:  02/28/2020 FINDINGS: Cardiac shadow is stable. Lungs are clear bilaterally. No bony abnormality is seen. IMPRESSION: No acute abnormality noted. Electronically Signed   By: Inez Catalina M.D.   On: 07/21/2021 19:41    Procedures Procedures    Medications Ordered in ED Medications - No data to display  ED Course/ Medical Decision Making/ A&P                            Medical Decision Making Amount and/or Complexity of Data Reviewed Labs: ordered. Radiology: ordered.   I discussed this case with my attending physician who cosigned this note including patient's presenting symptoms, physical exam, and planned diagnostics and interventions. Attending physician stated agreement with plan or made changes to plan which were implemented.   Attending physician assessed patient at bedside.  Given the large differential diagnosis for Diana Schultz, the decision making in this case is of high complexity.  After evaluating all of the data points in this case, the presentation of Diana Schultz is NOT consistent with Acute Coronary Syndrome (ACS) and/or myocardial ischemia, pulmonary embolism, aortic dissection; Borhaave's, significant arrythmia, pneumothorax, cardiac tamponade, or other emergent cardiopulmonary condition.  Further, the presentation of Diana Schultz is NOT consistent with pericarditis, myocarditis, cholecystitis,  pancreatitis, mediastinitis, endocarditis, new valvular disease.  Additionally, the presentation of Diana Schultz is NOT consistent with flail chest, cardiac contusion, ARDS, or significant intra-thoracic or intra-abdominal bleeding.  Moreover, this presentation is NOT consistent with pneumonia, sepsis, or pyelonephritis.  The patient has a unremarkable lab work including CBC, BMP which are independently reviewed.  She does have a minimally elevated creatinine, 1.08, however most recent baseline on file was 1 year ago.  Encourage increased fluid intake with patient.  She has negative troponins x2.  Her CBC shows no evidence of white blood cell elevation, anemia.  Additionally her D-dimer is negative.  I ordered and independently interpreted chest x-ray which shows no intrathoracic abnormality.  Her EKG was confirmed and interpreted by my attending Dr. Regenia Skeeter, and shows sinus rhythm with nonspecific T wave abnormality.  Patient has a heart score of 4.  Overall I have minimal clinical concern the patient's presentation today is representative of acute cardiac or pulmonary process.  Discussed I do feel that it is consistent with musculoskeletal chest pain, versus reflux/esophagitis/hiatal hernia type pain.  Encouraged Tylenol, ibuprofen, follow-up with primary care doctor, and orthopedics, dietary adjustments to help with acid reflux, and daily acid reflux medication.  Patient understands agrees to plan at this time.  Strict return and follow-up precautions have been given by me personally or by detailed written instruction given verbally by nursing staff using the teach back method to the patient/family/caregiver(s).  Data Reviewed/Counseling: I have reviewed the patient's vital signs, nursing notes, and other relevant tests/information. I had a detailed discussion regarding the historical points, exam findings, and any diagnostic results supporting the discharge diagnosis. I also discussed the need  for outpatient follow-up and the need to return to the ED if symptoms worsen or if there are any questions or concerns that arise at home.  Final Clinical Impression(s) / ED Diagnoses Final diagnoses:  Chest pain, unspecified type    Rx / DC Orders ED Discharge Orders     None         Anselmo Pickler, PA-C 07/22/21 0019    Sherwood Gambler, MD 07/25/21 [phone]

## 2021-07-23 ENCOUNTER — Other Ambulatory Visit (HOSPITAL_BASED_OUTPATIENT_CLINIC_OR_DEPARTMENT_OTHER): Payer: Self-pay

## 2021-07-24 ENCOUNTER — Other Ambulatory Visit (HOSPITAL_BASED_OUTPATIENT_CLINIC_OR_DEPARTMENT_OTHER): Payer: Self-pay

## 2021-07-27 ENCOUNTER — Other Ambulatory Visit (HOSPITAL_BASED_OUTPATIENT_CLINIC_OR_DEPARTMENT_OTHER): Payer: Self-pay

## 2021-07-28 ENCOUNTER — Other Ambulatory Visit (HOSPITAL_BASED_OUTPATIENT_CLINIC_OR_DEPARTMENT_OTHER): Payer: Self-pay

## 2021-07-29 ENCOUNTER — Ambulatory Visit (INDEPENDENT_AMBULATORY_CARE_PROVIDER_SITE_OTHER): Payer: BC Managed Care – PPO | Admitting: Family Medicine

## 2021-07-29 ENCOUNTER — Other Ambulatory Visit: Payer: Self-pay

## 2021-07-29 ENCOUNTER — Encounter (INDEPENDENT_AMBULATORY_CARE_PROVIDER_SITE_OTHER): Payer: Self-pay | Admitting: Family Medicine

## 2021-07-29 VITALS — BP 168/99 | HR 72 | Temp 97.7°F | Ht 63.0 in | Wt 309.0 lb

## 2021-07-29 DIAGNOSIS — Z6841 Body Mass Index (BMI) 40.0 and over, adult: Secondary | ICD-10-CM

## 2021-07-29 DIAGNOSIS — E669 Obesity, unspecified: Secondary | ICD-10-CM | POA: Diagnosis not present

## 2021-07-29 DIAGNOSIS — G4730 Sleep apnea, unspecified: Secondary | ICD-10-CM | POA: Diagnosis not present

## 2021-07-29 DIAGNOSIS — R7301 Impaired fasting glucose: Secondary | ICD-10-CM | POA: Diagnosis not present

## 2021-07-29 DIAGNOSIS — I10 Essential (primary) hypertension: Secondary | ICD-10-CM

## 2021-07-29 DIAGNOSIS — E8881 Metabolic syndrome: Secondary | ICD-10-CM | POA: Diagnosis not present

## 2021-07-30 ENCOUNTER — Other Ambulatory Visit (HOSPITAL_BASED_OUTPATIENT_CLINIC_OR_DEPARTMENT_OTHER): Payer: Self-pay

## 2021-07-30 LAB — FRUCTOSAMINE: Fructosamine: 233 umol/L (ref 0–285)

## 2021-07-30 MED ORDER — OZEMPIC (0.25 OR 0.5 MG/DOSE) 2 MG/1.5ML ~~LOC~~ SOPN
PEN_INJECTOR | SUBCUTANEOUS | 0 refills | Status: DC
  Filled 2021-07-30: qty 1.5, 56d supply, fill #0
  Filled 2021-08-31 – 2021-09-15 (×7): qty 1.5, 28d supply, fill #0

## 2021-07-30 MED ORDER — SPIRONOLACTONE 25 MG PO TABS
25.0000 mg | ORAL_TABLET | Freq: Every day | ORAL | 0 refills | Status: DC
  Filled 2021-07-30: qty 90, 90d supply, fill #0

## 2021-07-30 NOTE — Progress Notes (Signed)
Chief Complaint:   OBESITY Diana Schultz is here to discuss her progress with her obesity treatment plan along with follow-up of her obesity related diagnoses. See Medical Weight Management Flowsheet for complete bioelectrical impedance results.  Today's visit was #: 3 Starting weight: 311 lbs Starting date: 07/08/2021 Weight change since last visit: 1 lb Total lbs lost to date: 2 lbs Total weight loss percentage to date: -0.64%  Nutrition Plan: Meal Prep Service for 100% of the time. Activity: Walking more 7 days per week. Anti-obesity medications: Ozempic 1 mg subcutaneously weekly. Reported side effects: None.  Interim History: Diana Schultz says she has an Ozempic sample, but has not started it yet due to confusion regarding the directions.  Assessment/Plan:   1. Sleep disorder breathing Will place referral to Sleep Medicine.  - Ambulatory referral to Sleep Studies  2. Metabolic syndrome Starting goal: Lose 7-10% of starting weight. She will continue to focus on protein-rich, low simple carbohydrate foods. We reviewed the importance of hydration, regular exercise for stress reduction, and restorative sleep.  We will continue to check lab work every 3 months, with 10% weight loss, or should any other concerns arise.  Plan:  Start Ozempic 0.25 mg subcutaneously weekly for 1 month, then increase to 0.5 mg subcutaneously weekly.  - Start Semaglutide,0.25 or 0.5MG /DOS, (OZEMPIC, 0.25 OR 0.5 MG/DOSE,) 2 MG/1.5ML SOPN; Inject 0.25mg  under the skin once weekly for 4 weeks, then inject 0.5mg  weekly  Dispense: 1.5 mL; Refill: 0  3. Essential hypertension Elevated. Medications: None.  She previously did well on spironolactone.   Plan:  Start spironolactone 25 mg daily.  Avoid buying foods that are: processed, frozen, or prepackaged to avoid excess salt. We will watch for signs of hypotension as she continues lifestyle modifications.  BP Readings from Last 3 Encounters:  07/29/21 (!) 168/99   07/21/21 (!) 147/93  07/15/21 (!) 165/88   Lab Results  Component Value Date   CREATININE 1.08 (H) 07/21/2021   - Start spironolactone (ALDACTONE) 25 MG tablet; Take 1 tablet (25 mg total) by mouth daily.  Dispense: 90 tablet; Refill: 0  4. Impaired fasting glucose Check fructosamine level today to see if in DM range.  - Fructosamine  5. Obesity, current BMI 54.7  Course: Diana Schultz is currently in the action stage of change. As such, her goal is to continue with weight loss efforts.   Nutrition goals: She has agreed to continue the Meal Prep Service.   Exercise goals:  As is.  Behavioral modification strategies: increasing lean protein intake, decreasing simple carbohydrates, increasing vegetables, and increasing water intake.  Diana Schultz has agreed to follow-up with our clinic in 4 weeks. She was informed of the importance of frequent follow-up visits to maximize her success with intensive lifestyle modifications for her multiple health conditions.   Diana Schultz was informed we would discuss her lab results at her next visit unless there is a critical issue that needs to be addressed sooner. Diana Schultz agreed to keep her next visit at the agreed upon time to discuss these results.  Objective:   Blood pressure (!) 168/99, pulse 72, temperature 97.7 F (36.5 C), temperature source Oral, height 5\' 3"  (1.6 m), weight (!) 309 lb (140.2 kg), last menstrual period 12/01/2013, SpO2 99 %. Body mass index is 54.74 kg/m.  General: Cooperative, alert, well developed, in no acute distress. HEENT: Conjunctivae and lids unremarkable. Cardiovascular: Regular rhythm.  Lungs: Normal work of breathing. Neurologic: No focal deficits.   Lab Results  Component Value Date  CREATININE 1.08 (H) 07/21/2021   BUN 19 07/21/2021   NA 140 07/21/2021   K 4.3 07/21/2021   CL 104 07/21/2021   CO2 28 07/21/2021   Lab Results  Component Value Date   ALT 21 07/08/2020   AST 17 07/08/2020   ALKPHOS 61 07/08/2020    BILITOT 0.5 07/08/2020   Lab Results  Component Value Date   HGBA1C 6.0 (H) 09/11/2019   HGBA1C 6.0 03/08/2016   HGBA1C 5.9 08/18/2015   HGBA1C 5.9 03/03/2015   HGBA1C 6.0 10/03/2014   Lab Results  Component Value Date   INSULIN 26.1 (H) 09/11/2019   Lab Results  Component Value Date   TSH 2.140 09/11/2019   Lab Results  Component Value Date   CHOL 158 09/11/2019   HDL 58 09/11/2019   LDLCALC 84 09/11/2019   TRIG 87 09/11/2019   CHOLHDL 2.7 09/11/2019   Lab Results  Component Value Date   VD25OH 25.7 (L) 09/11/2019   Lab Results  Component Value Date   WBC 9.6 07/21/2021   HGB 12.9 07/21/2021   HCT 40.6 07/21/2021   MCV 76.7 (L) 07/21/2021   PLT 218 07/21/2021   Lab Results  Component Value Date   IRON 48 09/11/2019   TIBC 282 09/11/2019   FERRITIN 363 (H) 09/11/2019   Attestation Statements:   Reviewed by clinician on day of visit: allergies, medications, problem list, medical history, surgical history, family history, social history, and previous encounter notes.  I, Water quality scientist, CMA, am acting as transcriptionist for Briscoe Deutscher, DO  I have reviewed the above documentation for accuracy and completeness, and I agree with the above. -  Briscoe Deutscher, DO, MS, FAAFP, DABOM - Family and Bariatric Medicine.

## 2021-08-04 ENCOUNTER — Other Ambulatory Visit (HOSPITAL_BASED_OUTPATIENT_CLINIC_OR_DEPARTMENT_OTHER): Payer: Self-pay

## 2021-08-17 ENCOUNTER — Other Ambulatory Visit (HOSPITAL_BASED_OUTPATIENT_CLINIC_OR_DEPARTMENT_OTHER): Payer: Self-pay

## 2021-08-19 ENCOUNTER — Other Ambulatory Visit (INDEPENDENT_AMBULATORY_CARE_PROVIDER_SITE_OTHER): Payer: Self-pay | Admitting: Family Medicine

## 2021-08-19 ENCOUNTER — Other Ambulatory Visit (HOSPITAL_BASED_OUTPATIENT_CLINIC_OR_DEPARTMENT_OTHER): Payer: Self-pay

## 2021-08-19 ENCOUNTER — Encounter (INDEPENDENT_AMBULATORY_CARE_PROVIDER_SITE_OTHER): Payer: Self-pay | Admitting: Family Medicine

## 2021-08-19 ENCOUNTER — Ambulatory Visit (INDEPENDENT_AMBULATORY_CARE_PROVIDER_SITE_OTHER): Payer: BC Managed Care – PPO | Admitting: Family Medicine

## 2021-08-19 DIAGNOSIS — E1169 Type 2 diabetes mellitus with other specified complication: Secondary | ICD-10-CM

## 2021-08-19 DIAGNOSIS — G4709 Other insomnia: Secondary | ICD-10-CM

## 2021-08-19 MED ORDER — TRAZODONE HCL 50 MG PO TABS
25.0000 mg | ORAL_TABLET | Freq: Every evening | ORAL | 0 refills | Status: DC | PRN
  Filled 2021-08-19: qty 30, 30d supply, fill #0

## 2021-08-19 MED ORDER — INSULIN PEN NEEDLE 32G X 4 MM MISC
1.0000 | 0 refills | Status: DC
  Filled 2021-08-19: qty 100, 90d supply, fill #0

## 2021-08-19 NOTE — Telephone Encounter (Signed)
Dr.Wallace °

## 2021-08-27 ENCOUNTER — Ambulatory Visit (INDEPENDENT_AMBULATORY_CARE_PROVIDER_SITE_OTHER): Payer: BC Managed Care – PPO | Admitting: Family Medicine

## 2021-08-27 ENCOUNTER — Encounter (INDEPENDENT_AMBULATORY_CARE_PROVIDER_SITE_OTHER): Payer: Self-pay

## 2021-08-31 ENCOUNTER — Other Ambulatory Visit (HOSPITAL_BASED_OUTPATIENT_CLINIC_OR_DEPARTMENT_OTHER): Payer: Self-pay

## 2021-09-03 ENCOUNTER — Ambulatory Visit (INDEPENDENT_AMBULATORY_CARE_PROVIDER_SITE_OTHER): Payer: BC Managed Care – PPO | Admitting: Family Medicine

## 2021-09-15 ENCOUNTER — Other Ambulatory Visit (HOSPITAL_BASED_OUTPATIENT_CLINIC_OR_DEPARTMENT_OTHER): Payer: Self-pay

## 2021-09-17 ENCOUNTER — Ambulatory Visit (INDEPENDENT_AMBULATORY_CARE_PROVIDER_SITE_OTHER): Payer: BC Managed Care – PPO | Admitting: Family Medicine

## 2021-09-17 ENCOUNTER — Encounter (INDEPENDENT_AMBULATORY_CARE_PROVIDER_SITE_OTHER): Payer: Self-pay | Admitting: Family Medicine

## 2021-09-17 ENCOUNTER — Other Ambulatory Visit (HOSPITAL_BASED_OUTPATIENT_CLINIC_OR_DEPARTMENT_OTHER): Payer: Self-pay

## 2021-09-17 VITALS — BP 162/85 | HR 65 | Temp 97.9°F | Ht 63.0 in | Wt 302.0 lb

## 2021-09-17 DIAGNOSIS — E1169 Type 2 diabetes mellitus with other specified complication: Secondary | ICD-10-CM | POA: Diagnosis not present

## 2021-09-17 DIAGNOSIS — L304 Erythema intertrigo: Secondary | ICD-10-CM

## 2021-09-17 DIAGNOSIS — D3502 Benign neoplasm of left adrenal gland: Secondary | ICD-10-CM | POA: Diagnosis not present

## 2021-09-17 DIAGNOSIS — Z7985 Long-term (current) use of injectable non-insulin antidiabetic drugs: Secondary | ICD-10-CM

## 2021-09-17 DIAGNOSIS — G4730 Sleep apnea, unspecified: Secondary | ICD-10-CM

## 2021-09-17 DIAGNOSIS — E669 Obesity, unspecified: Secondary | ICD-10-CM

## 2021-09-17 DIAGNOSIS — Z6841 Body Mass Index (BMI) 40.0 and over, adult: Secondary | ICD-10-CM

## 2021-09-17 DIAGNOSIS — F419 Anxiety disorder, unspecified: Secondary | ICD-10-CM

## 2021-09-17 DIAGNOSIS — Z9189 Other specified personal risk factors, not elsewhere classified: Secondary | ICD-10-CM

## 2021-09-17 DIAGNOSIS — G4709 Other insomnia: Secondary | ICD-10-CM

## 2021-09-17 MED ORDER — VENLAFAXINE HCL ER 37.5 MG PO CP24
37.5000 mg | ORAL_CAPSULE | Freq: Every day | ORAL | 3 refills | Status: DC
  Filled 2021-09-17: qty 30, 30d supply, fill #0

## 2021-09-17 MED ORDER — NYSTATIN 100000 UNIT/GM EX OINT
1.0000 "application " | TOPICAL_OINTMENT | Freq: Two times a day (BID) | CUTANEOUS | 0 refills | Status: DC
  Filled 2021-09-17: qty 30, 15d supply, fill #0

## 2021-09-17 MED ORDER — TIRZEPATIDE 5 MG/0.5ML ~~LOC~~ SOAJ
5.0000 mg | SUBCUTANEOUS | 0 refills | Status: DC
  Filled 2021-09-17 – 2021-09-23 (×3): qty 6, 84d supply, fill #0
  Filled 2021-09-23 – 2021-09-24 (×2): qty 2, 28d supply, fill #0
  Filled 2021-09-25: qty 4, 56d supply, fill #0

## 2021-09-17 MED ORDER — TRAZODONE HCL 50 MG PO TABS
25.0000 mg | ORAL_TABLET | Freq: Every evening | ORAL | 0 refills | Status: DC | PRN
Start: 2021-09-17 — End: 2022-01-19
  Filled 2021-09-17: qty 30, 30d supply, fill #0

## 2021-09-21 ENCOUNTER — Other Ambulatory Visit (HOSPITAL_BASED_OUTPATIENT_CLINIC_OR_DEPARTMENT_OTHER): Payer: Self-pay

## 2021-09-22 ENCOUNTER — Encounter: Payer: Self-pay | Admitting: Neurology

## 2021-09-22 ENCOUNTER — Ambulatory Visit (INDEPENDENT_AMBULATORY_CARE_PROVIDER_SITE_OTHER): Payer: BC Managed Care – PPO | Admitting: Neurology

## 2021-09-22 VITALS — BP 187/106 | HR 64 | Ht 63.5 in | Wt 303.0 lb

## 2021-09-22 DIAGNOSIS — R0683 Snoring: Secondary | ICD-10-CM | POA: Insufficient documentation

## 2021-09-22 DIAGNOSIS — Z86718 Personal history of other venous thrombosis and embolism: Secondary | ICD-10-CM | POA: Diagnosis not present

## 2021-09-22 DIAGNOSIS — R7303 Prediabetes: Secondary | ICD-10-CM

## 2021-09-22 DIAGNOSIS — I150 Renovascular hypertension: Secondary | ICD-10-CM

## 2021-09-22 DIAGNOSIS — R351 Nocturia: Secondary | ICD-10-CM

## 2021-09-22 DIAGNOSIS — I280 Arteriovenous fistula of pulmonary vessels: Secondary | ICD-10-CM

## 2021-09-22 DIAGNOSIS — E662 Morbid (severe) obesity with alveolar hypoventilation: Secondary | ICD-10-CM

## 2021-09-22 DIAGNOSIS — G4719 Other hypersomnia: Secondary | ICD-10-CM | POA: Insufficient documentation

## 2021-09-22 DIAGNOSIS — Z6841 Body Mass Index (BMI) 40.0 and over, adult: Secondary | ICD-10-CM

## 2021-09-22 NOTE — Progress Notes (Signed)
? ? ?SLEEP MEDICINE CLINIC ?  ? ?Provider:  Larey Seat, MD  ?Primary Care Physician:  Karleen Hampshire., MD ?[address] ?[place] [address]  ? ?  ?Referring Provider: Briscoe Deutscher, Do ?[address] ?[address] ?[place],  [place] [address]  ?  ?  ?    ?Chief Complaint according to patient   ?Patient presents with:  ?  ? New Patient (Initial Visit)  ?     ?  ?  ?HISTORY OF PRESENT ILLNESS:  ?Diana Schultz is a 56 y.o.  Biracial female patient seen here as a referral on 09/22/2021 from weight and wellness  ?Chief concern according to patient :  Pt referred for sleep consult, morbid obesity and sleep disorder.  ?Previous PSG done in 2019, no sleep apnea then at [facility] point. In lab test- . Pt c/o of insomnia,loud snoring, and sleepiness. She reports her mind racing and her husband reports her snoring loudly, but she feels she wasn't sleeping.  ? May be due to the medications she is on. Works now day shift. Here to r/o sleep apnea. 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  ? ?Her sleep test is listed in EPIC as a SPLIT night but no results were filed .  ?  ?09-22-2021 Diana Schultz  has a past medical history of Adrenal adenoma, Anemia, Arteriovenous malformation, Depression, Diabetes mellitus without complication ([place]), Diverticulitis, DVT (deep venous thrombosis) ([place]), Fatty liver, Gallbladder disease, Hypertension, Lactose intolerance, Morbid obesity ([place]), Ovarian cyst, Prediabetes, Thalassemia, Umbilical hernia, Vaginal delivery (1984, 1989), and Vitamin D deficiency.. ?   ?Sleep relevant medical history: Nocturia 3-4 times (!) slowed down on Ozempic.  ?  ? Family medical /sleep history: No other family member on CPAP with OSA, neither insomnia, nor sleep walkers.  ?  ?Social history:  Patient is working as Office manager- and lives in a household with spouse, grown children , 43 and 48. 1 grandchild, 6 years old.  ?The patient  currently works now in daytime,  used to work in shifts( Presenter, broadcasting,) ?Pets : 2 dogs present. ?Tobacco use-none .  ? ETOH use : none , ? Caffeine intake in form of Coffee( /) Soda( less frequent- on Ozempic) Tea ( sweet tea, 16 ounces) or energy drinks. ?Regular exercise in form of walking.   ?Hobbies :none  ? ?  ?  ?Sleep habits are as follows: The patient's dinner time is between 5.30- 6 PM. The patient goes to bed at 9-9.30 PM and struggles to fall asleep, just started on trazodone- continues to sleep for intervals of 1-2 hours, wakes for many, bathroom breaks, the first time at 1 AM.   ?The preferred sleep position is laterally-, with the support of 2 pillows. On a wedge- orthopnea. GERD at night.  ?Dreams are reportedly frequent/vivid. She wakes from headaches, from feeling hypoglycemic, from acid reflux and nocturia.  ?5-6  AM is the usual rise time. The patient wakes up spontaneously before her alarm.  ?She reports not feeling refreshed or restored in AM, with symptoms such as dry mouth, morning headaches, and residual fatigue.  ?Naps are taken infrequently, lasting from 15 to 25 minutes and are more  refreshing than nocturnal sleep.  ?  ?Review of Systems: ?Out of a complete 14 system review, the patient complains of only the following symptoms, and all other reviewed systems are negative.:  ?Fatigue, sleepiness ,  snoring, fragmented sleep, Insomnia with racing mind, NOCTURIA, GERD,  ?  ?How likely are you to doze in the following situations: ?0 = not likely, 1 = slight chance, 2 = moderate chance, 3 = high chance ?  ?Sitting and Reading? ?Watching Television? ?Sitting inactive in a public place (theater or meeting)? ?As a passenger in a car for an hour without a break? ?Lying down in the afternoon when circumstances permit? ?Sitting and talking to someone? ?Sitting quietly after lunch without alcohol? ?In a car, while stopped for a few minutes in traffic? ?  ?Total = 5/ 24 points  ? FSS endorsed at 34/  63 points.  ? ?Social History  ? ?Socioeconomic History  ? Marital status: Married  ?  Spouse name: Mateo Flow  ? Number of children: 2  ? Years of education: Not on file  ? Highest education level: Some college, no degree  ?Occupational History  ? Occupation: CNA, med tech  by training, does prn works  ?Tobacco Use  ? Smoking status: Never  ? Smokeless tobacco: Never  ?Vaping Use  ? Vaping Use: Never used  ?Substance and Sexual Activity  ? Alcohol use: No  ?  Comment: rare   ? Drug use: No  ? Sexual activity: Not Currently  ?  Birth control/protection: Surgical  ?Other Topics Concern  ? Not on file  ?Social History Narrative  ? Original from [place], household: pt and husband  ? Lives at home with husband and children  ? Right handed  ? Caffeine: 1 cup sweet tea every day  ? ?Social Determinants of Health  ? ?Financial Resource Strain: Not on file  ?Food Insecurity: Not on file  ?Transportation Needs: Not on file  ?Physical Activity: Not on file  ?Stress: Not on file  ?Social Connections: Not on file  ? ? ?Family History  ?Problem Relation Age of Onset  ? Cancer Mother   ? Hypertension Mother   ? Diabetes Mother   ? Stroke Mother   ?     early in life  ? Endometrial cancer Mother   ? Hyperlipidemia Mother   ? Depression Mother   ? Mental retardation Father   ? Breast cancer Father   ? Depression Father   ? Hypertension Sister   ? Hypertension Brother   ? Heart failure Paternal Grandfather   ? CAD Other   ?     GP, late in like  ? Colon cancer Neg Hx   ? ? ?Past Medical History:  ?Diagnosis Date  ? Adrenal adenoma   ? Anemia   ? Arteriovenous malformation   ? Depression   ? Diabetes mellitus without complication ([place])   ? borderline - no meds  ? Diverticulitis   ? DVT (deep venous thrombosis) ([place])   ? Hx at age 40 blood clot in right leg, d/c birth control pills and no other problems  ? Fatty liver   ? Gallbladder disease   ? Hypertension   ? history,lost weight, controlled with diet and exercise, no current med  ?  Lactose intolerance   ? Morbid obesity ([place])   ? Ovarian cyst   ? Prediabetes   ? Thalassemia   ? Umbilical hernia   ? Vaginal delivery 1984, 1989  ? Vitamin D deficiency   ? ? ?Past Surgical History:  ?Procedure Laterality Date  ? ABDOMINAL HYSTERECTOMY    ? ABLATION  03/23/13  ? [place]  ? APPENDECTOMY    ? CHOLECYSTECTOMY    ? DILATION  AND CURETTAGE OF UTERUS    ? DILATION AND CURETTAGE OF UTERUS N/A 11/01/2013  ? Procedure: DILATATION AND CURETTAGE;  Surgeon: Mora Bellman, MD;  Location: [facility];  Service: Gynecology;  Laterality: N/A;  ? DILITATION & CURRETTAGE/HYSTROSCOPY WITH NOVASURE ABLATION N/A 03/23/2013  ? Procedure: DILATATION & CURETTAGE/HYSTEROSCOPY WITH NOVASURE ABLATION;  Surgeon: Lavonia Drafts, MD;  Location: [facility];  Service: Gynecology;  Laterality: N/A;  ? LEEP    ? TUBAL LIGATION    ? VAGINAL HYSTERECTOMY N/A 12/31/2013  ? Procedure: HYSTERECTOMY VAGINAL;  Surgeon: Lavonia Drafts, MD;  Location: [facility];  Service: Gynecology;  Laterality: N/A;  ?  ? ?Current Outpatient Medications on File Prior to Visit  ?Medication Sig Dispense Refill  ? Insulin Pen Needle 32G X 4 MM MISC Use 1 each as directed once a week. 100 each 0  ? nystatin ointment (MYCOSTATIN) Apply 1 application. topically 2 (two) times daily. 30 g 0  ? spironolactone (ALDACTONE) 25 MG tablet Take 1 tablet (25 mg total) by mouth daily. 90 tablet 0  ? tirzepatide (MOUNJARO) 5 MG/0.5ML Pen Inject 5 mg into the skin once a week. 6 mL 0  ? traZODone (DESYREL) 50 MG tablet Take 0.5-1 tablets (25-50 mg total) by mouth at bedtime as needed for sleep. 30 tablet 0  ? venlafaxine XR (EFFEXOR XR) 37.5 MG 24 hr capsule Take 1 capsule (37.5 mg total) by mouth daily with breakfast. 30 capsule 3  ? ?No current facility-administered medications on file prior to visit.  ? ? ?Allergies  ?Allergen Reactions  ? Amlodipine Cough and Swelling  ?  Other reaction(s): Cough (ALLERGY/intolerance) ?  ? Metoprolol Swelling  ? Benicar [Olmesartan] Cough   ? Hydrochlorothiazide   ?  Other reaction(s): Cough (ALLERGY/intolerance)  ? Lactose Intolerance (Gi)   ? Lisinopril Cough  ? Nebivolol   ?  Other reaction(s): Other (See Comments) ?bradycardia  ? Semaglut

## 2021-09-22 NOTE — Progress Notes (Signed)
Chief Complaint:   OBESITY Diana Schultz is here to discuss her progress with her obesity treatment plan along with follow-up of her obesity related diagnoses.   Today's visit was #: 4 Starting weight: 311 lbs Starting date: 07/08/2021 Today's weight: 302 lbs Today's date: 09/17/2021 Weight change since last visit: -7 lbs Total lbs lost to date: 9 lbs Body mass index is 53.5 kg/m.  Total weight loss percentage to date: -2.89%  Current Meal Plan: [employer] for 100% of the time.  Current Exercise Plan: Walking for 60 minutes 7 times per week. Current Anti-Obesity Medications: Ozempic 1 mg subcutaneously weekly. Side effects: None.  Interim History: Royale reports that she is taking 1 mg Ozempic weekly. She will run out soon. She will need to meet her deductable for GLP1RA to be more affordable.  Assessment/Plan:   1. Type 2 diabetes mellitus with other specified complication, without long-term current use of insulin (HCC) Diabetes Mellitus: Not at goal. Medication: Ozempic 1 mg subcutaneously weekly. Issues reviewed: blood sugar goals, complications of diabetes mellitus, hypoglycemia prevention and treatment, exercise, and nutrition.   Plan: The patient will continue to focus on protein-rich, low simple carbohydrate foods. We reviewed the importance of hydration, regular exercise for stress reduction, and restorative sleep. Start Mounjaro 5 mg, as per below. She has tried and failed Ozempic, Trulicity, Victoza, and Metformin.  Lab Results  Component Value Date   HGBA1C 6.0 (H) 09/11/2019   HGBA1C 6.0 03/08/2016   HGBA1C 5.9 08/18/2015   Lab Results  Component Value Date   LDLCALC 84 09/11/2019   CREATININE 1.08 (H) 07/21/2021   - Start tirzepatide (MOUNJARO) 5 MG/0.5ML Pen; Inject 5 mg into the skin once a week.  Dispense: 6 mL; Refill: 0  2. Sleep disorder breathing Noni will see Dr. Brett Fairy on 09/22/2021. We  will continue to monitor symptoms as they relate to her weight loss journey.  3. Adrenal adenoma, left She has a follow up with Endocrinology on 10/08/2021. We will continue to monitor symptoms as they relate to her weight loss journey.  4. Other insomnia This is uncontrolled. Current treatment: Trazodone 25-50 mg as needed for sleep.   Plan: Refill trazodone 50 mg 1/2-1 tablet at bedtime as needed for sleep. Recommend sleep hygiene measures including regular sleep schedule, optimal sleep environment, and relaxing presleep rituals.   - Refill traZODone (DESYREL) 50 MG tablet; Take 0.5-1 tablets (25-50 mg total) by mouth at bedtime as needed for sleep.  Dispense: 30 tablet; Refill: 0  5. Intertrigo Start Nystatin ointment, as per below.  - Start nystatin ointment (MYCOSTATIN); Apply 1 application. topically 2 (two) times daily.  Dispense: 30 g; Refill: 0  6. Anxiety Behavior modification techniques were discussed today to help Macaiah deal with her anxiety.    Plan: After discussion, patient would like to start below medication. Expectations, risks, and potential side effects reviewed.   - Start venlafaxine XR (EFFEXOR XR) 37.5 MG 24 hr capsule; Take 1 capsule (37.5 mg total) by mouth daily with breakfast.  Dispense: 30 capsule; Refill: 3  7. At risk for heart disease Due to Tammi D Weakland's current state of health  and medical condition(s), she is at a higher risk for heart disease.  This puts the patient at much greater risk to subsequently develop cardiopulmonary conditions that can significantly affect patient's quality of life in a negative manner as well.    At least 9 minutes were spent on counseling Samanthajo D Cheuvront about these concerns today, and we discussed the importance of reversing risks factors of obesity, especially truncal and visceral fat, hypertension, hyperlipidemia, and pre-diabetes.  The initial goal is to lose at least 5-10% of starting weight to help reduce these risk  factors.  Counseling: Intensive lifestyle modifications were discussed with Presley D Maxcy as the most appropriate first line of treatment.  she will continue to work on diet, exercise, and weight loss efforts.  We will continue to reassess these conditions on a fairly regular basis in an attempt to decrease the patient's overall morbidity and mortality.  Evidence-based interventions for health behavior change were utilized today including the discussion of self monitoring techniques, problem-solving barriers, and SMART goal setting techniques.  Specifically, regarding patient's less desirable eating habits and patterns, we employed the technique of small changes when Lyriq D Haynie has not been able to fully commit to her prudent nutritional plan.  8. Obesity, current BMI 53.6 Course: Findley is currently in the action stage of change. As such, her goal is to continue with weight loss efforts.   Nutrition goals: She has agreed to [employer].   Exercise goals: As is.  Behavioral modification strategies: increasing lean protein intake, decreasing simple carbohydrates, and increasing vegetables.  Mikeyla has agreed to follow-up with our clinic in 6 weeks. She was informed of the importance of frequent follow-up visits to maximize her success with intensive lifestyle modifications for her multiple health conditions.   Objective:   Blood pressure (!) 162/85, pulse 65, temperature 97.9 F (36.6 C), temperature source Oral, height '5\' 3"'$  (1.6 m), weight (!) 302 lb (137 kg), last menstrual period 12/01/2013, SpO2 95 %. Body mass index is 53.5 kg/m.  General: Cooperative, alert, well developed, in no acute distress. HEENT: Conjunctivae and lids unremarkable. Cardiovascular: Regular rhythm.  Lungs: Normal work of breathing. Neurologic: No focal deficits.   Lab Results  Component Value Date   CREATININE 1.08 (H) 07/21/2021   BUN 19 07/21/2021   NA 140 07/21/2021   K 4.3 07/21/2021   CL 104  07/21/2021   CO2 28 07/21/2021   Lab Results  Component Value Date   ALT 21 07/08/2020   AST 17 07/08/2020   ALKPHOS 61 07/08/2020   BILITOT 0.5 07/08/2020   Lab Results  Component Value Date   HGBA1C 6.0 (H) 09/11/2019   HGBA1C 6.0 03/08/2016   HGBA1C 5.9 08/18/2015   HGBA1C 5.9 03/03/2015   HGBA1C 6.0 10/03/2014   Lab Results  Component Value Date   INSULIN 26.1 (H) 09/11/2019   Lab Results  Component Value Date   TSH 2.140 09/11/2019   Lab Results  Component Value Date   CHOL 158 09/11/2019   HDL 58 09/11/2019   LDLCALC 84 09/11/2019   TRIG 87 09/11/2019   CHOLHDL 2.7 09/11/2019   Lab Results  Component Value Date   VD25OH 25.7 (L) 09/11/2019   Lab Results  Component Value Date   WBC 9.6 07/21/2021   HGB 12.9 07/21/2021   HCT 40.6 07/21/2021   MCV 76.7 (L) 07/21/2021   PLT 218 07/21/2021   Lab Results  Component Value Date   IRON 48 09/11/2019  TIBC 282 09/11/2019   FERRITIN 363 (H) 09/11/2019   Attestation Statements:   Reviewed by clinician on day of visit: allergies, medications, problem list, medical history, surgical history, family history, social history, and previous encounter notes.  Leodis Binet Friedenbach, CMA, am acting as Location manager for [employer], DO.  I have reviewed the above documentation for accuracy and completeness, and I agree with the above. -  Briscoe Deutscher, DO, MS, FAAFP, DABOM - Family and Bariatric Medicine.

## 2021-09-23 ENCOUNTER — Encounter (INDEPENDENT_AMBULATORY_CARE_PROVIDER_SITE_OTHER): Payer: Self-pay | Admitting: Family Medicine

## 2021-09-23 ENCOUNTER — Other Ambulatory Visit (HOSPITAL_BASED_OUTPATIENT_CLINIC_OR_DEPARTMENT_OTHER): Payer: Self-pay

## 2021-09-23 NOTE — Telephone Encounter (Signed)
Prior authorization has been started for Mounjaro. Will notify patient and provider once response is received.  

## 2021-09-24 ENCOUNTER — Encounter (INDEPENDENT_AMBULATORY_CARE_PROVIDER_SITE_OTHER): Payer: Self-pay

## 2021-09-24 ENCOUNTER — Other Ambulatory Visit (HOSPITAL_BASED_OUTPATIENT_CLINIC_OR_DEPARTMENT_OTHER): Payer: Self-pay

## 2021-09-25 ENCOUNTER — Other Ambulatory Visit (HOSPITAL_BASED_OUTPATIENT_CLINIC_OR_DEPARTMENT_OTHER): Payer: Self-pay

## 2021-09-28 ENCOUNTER — Other Ambulatory Visit (HOSPITAL_BASED_OUTPATIENT_CLINIC_OR_DEPARTMENT_OTHER): Payer: Self-pay

## 2021-10-01 ENCOUNTER — Telehealth: Payer: Self-pay

## 2021-10-01 DIAGNOSIS — R7303 Prediabetes: Secondary | ICD-10-CM

## 2021-10-01 DIAGNOSIS — I280 Arteriovenous fistula of pulmonary vessels: Secondary | ICD-10-CM

## 2021-10-01 DIAGNOSIS — Z86718 Personal history of other venous thrombosis and embolism: Secondary | ICD-10-CM

## 2021-10-01 DIAGNOSIS — R351 Nocturia: Secondary | ICD-10-CM

## 2021-10-01 DIAGNOSIS — R0683 Snoring: Secondary | ICD-10-CM

## 2021-10-01 DIAGNOSIS — I150 Renovascular hypertension: Secondary | ICD-10-CM

## 2021-10-01 NOTE — Addendum Note (Signed)
Addended by: Verlin Grills on: 10/01/2021 01:21 PM ? ? Modules accepted: Orders ? ?

## 2021-10-01 NOTE — Telephone Encounter (Signed)
Insurance has denied inlab sleep study request. They will approve a HST though. Can I get an order for HST, please. Thanks! ?

## 2021-10-01 NOTE — Telephone Encounter (Signed)
HST has been placed.  ?

## 2021-10-14 ENCOUNTER — Ambulatory Visit (INDEPENDENT_AMBULATORY_CARE_PROVIDER_SITE_OTHER): Payer: BC Managed Care – PPO | Admitting: Neurology

## 2021-10-14 DIAGNOSIS — E662 Morbid (severe) obesity with alveolar hypoventilation: Secondary | ICD-10-CM

## 2021-10-14 DIAGNOSIS — R7303 Prediabetes: Secondary | ICD-10-CM

## 2021-10-14 DIAGNOSIS — I280 Arteriovenous fistula of pulmonary vessels: Secondary | ICD-10-CM

## 2021-10-14 DIAGNOSIS — G4733 Obstructive sleep apnea (adult) (pediatric): Secondary | ICD-10-CM

## 2021-10-14 DIAGNOSIS — R351 Nocturia: Secondary | ICD-10-CM

## 2021-10-14 DIAGNOSIS — R0683 Snoring: Secondary | ICD-10-CM

## 2021-10-14 DIAGNOSIS — Z86718 Personal history of other venous thrombosis and embolism: Secondary | ICD-10-CM

## 2021-10-14 DIAGNOSIS — I150 Renovascular hypertension: Secondary | ICD-10-CM

## 2021-10-15 NOTE — Progress Notes (Signed)
? ? ?  ?  ?[facility] ?  ?HOME SLEEP TEST REPORT ( by Watch PAT)   ?STUDY DATA loaded on 10-16-2021   ? ?  ?ORDERING CLINICIAN: Larey Seat, MD  ?REFERRING CLINICIAN:   [facility]  ? Briscoe Deutscher, DO ?[address] ?[address] ?[place],   [id number]  ?  ?CLINICAL INFORMATION/HISTORY: Diana Schultz is a 56 y.o. female patient seen here upon referral on 09/22/2021 from [facility]  ? Pt referred for sleep consult, 2 to morbid obesity class 3 and previous dx sleep disorder.  ?Previous PSG was done in 2019, no sleep apnea was found- then repeat at [facility]. In lab test- PSG. Pt c/o of insomnia, loud snoring, and sleepiness. She reports her mind racing and her husband reports her snoring loudly, but she feels she wasn't sleeping at all. May be due to the medications she is on. 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  ? She reports morning headaches, nocturia and HTN, DM. ?Her sleep test is listed in EPIC as a SPLIT protocol test overnight but no results were filed.  ?  ?09-22-2021 Diana Schultz  has a past medical history of Adrenal adenoma, Anemia, Arteriovenous malformation, Depression, Diabetes mellitus without complication ([place]), Diverticulitis, DVT (deep venous thrombosis) ([place]), Fatty liver, Gallbladder disease, Hypertension, Lactose intolerance, Morbid obesity ([place]), Ovarian cyst, Prediabetes, Thalassemia, Umbilical hernia, Vaginal delivery (1984, 1989), and Vitamin D deficiency.Nocturia 3-4 times (!) slowed down on Ozempic.  ? ? ?  ?Epworth sleepiness score: 5 /24. ?  ?BMI: 51.9 kg/m? ?  ?Neck Circumference: 17" ?  ?FINDINGS: ?  ?Sleep Summary: ?  ?Total Recording Time (hours, min): The total recorded sleep time for this home sleep test was 8 hours and 36 minutes of which the calculated sleep time amounted to 6 hours and 37 minutes.  There were 16.9% of REM sleep recorded.     ? Percent  REM (%): 16.9%                                    ?  ?Respiratory Indices: ?  ?Calculated pAHI (per hour): 36.3/h                          ?  ?REM pAHI:    28.7/h                                           ?NREM pAHI: 37.8/h  ?                         ?Positional AHI:   The patient slept the longest time in supine position with an AHI of 34.7, RDI of 42.7.  This was followed by 20 minutes of sleep in the right lateral position AHI of 51.6/h and 90 minutes in the left position , with the AHI of 49.2/h. ? ?Snoring had a mean volume of 42 dB and was present for 40.7% of total sleep time there were only 2.3 minutes of snoring over 60 dB. ?                                              ?  Oxygen Saturation Statistics: ?  ?O2 Saturation Range (%): Between a nadir at 81% and a maximum saturation of 98% with a mean oxygen saturation of 92%.                                    ?  ?O2 Saturation (minutes) <89%:   3 minutes      ?  ?Pulse Rate Statistics:              ?  ?Pulse Range: Between 49 and 97 bpm and a mean heart rate of 66 bpm.              ?  ?IMPRESSION:  This HST confirms the presence of severe sleep apnea nearly all sleep was recorded in supine sleep position at the end the patient entered several phases of REM sleep.  These were associated with brief hypoxic events, unsustained.  No central apneas were recorded. ?  ?RECOMMENDATION: This patient has been benefiting from trazodone which helps her to fall asleep and somewhat stay asleep.  However she has reported that she did not feel she has restful and restorative sleep at all.  Her sleep architecture shows fragmented sleep for the second half of the night while sleep until 2 AM showed fairly normal architecture.  ? ? I would recommend to treat this patient with autotitration CPAP , with a pressure range from 5 through 16 cmH2O . 2 centimeter H20 for EPR, heated humidification and I would very much prefer to have her fitted in person for a mask of her comfort and  choice. ?I would also consider a higher dose of trazodone if tolerated.  ? ?  ?INTERPRETING PHYSICIAN: ? ? Larey Seat, MD  ? ?Medical Director of [facility].  ? ? ? ? ? ? ? ? ? ? ? ? ? ? ? ? ? ? ? ? ?

## 2021-10-26 ENCOUNTER — Telehealth: Payer: Self-pay | Admitting: Neurology

## 2021-10-26 NOTE — Addendum Note (Signed)
Addended by: Larey Seat on: 10/26/2021 06:02 PM ? ? Modules accepted: Orders ? ?

## 2021-10-26 NOTE — Telephone Encounter (Signed)
Pt states its been over 2 weeks and she would like to get a call with the results to her sleep study as soon as they are available. ?

## 2021-10-26 NOTE — Procedures (Signed)
? ?  ?  ?[facility] ?  ?HOME SLEEP TEST REPORT ( by Watch PAT)   ?STUDY DATA loaded on 10-16-2021   ? ?  ?ORDERING CLINICIAN: Larey Seat, MD  ?REFERRING CLINICIAN:   [facility]  ? Briscoe Deutscher, DO ?[address] ?[address] ?[place],  [place] [id number]  ?  ?CLINICAL INFORMATION/HISTORY: Diana Schultz is a 56 y.o. female patient seen here upon referral on 09/22/2021 from [facility]  ? Pt referred for sleep consult, 2 to morbid obesity class 3 and previous dx sleep disorder.  ?Previous PSG was done in 2019, no sleep apnea was found- then repeat at [facility]. In lab test- PSG. Pt c/o of insomnia, loud snoring, and sleepiness. She reports her mind racing and her husband reports her snoring loudly, but she feels she wasn't sleeping at all. May be due to the medications she is on. 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  ? She reports morning headaches, nocturia and HTN, DM. ?Her sleep test is listed in EPIC as a SPLIT protocol test overnight but no results were filed.  ?  ?09-22-2021 Diana Schultz  has a past medical history of Adrenal adenoma, Anemia, Arteriovenous malformation, Depression, Diabetes mellitus without complication ([place]), Diverticulitis, DVT (deep venous thrombosis) ([place]), Fatty liver, Gallbladder disease, Hypertension, Lactose intolerance, Morbid obesity ([place]), Ovarian cyst, Prediabetes, Thalassemia, Umbilical hernia, Vaginal delivery (1984, 1989), and Vitamin D deficiency.Nocturia 3-4 times (!) slowed down on Ozempic.  ? ? ?  ?Epworth sleepiness score: 5 /24. ?  ?BMI: 51.9 kg/m? ?  ?Neck Circumference: 17" ?  ?FINDINGS: ?  ?Sleep Summary: ?  ?Total Recording Time (hours, min): The total recorded sleep time for this home sleep test was 8 hours and 36 minutes of which the calculated sleep time amounted to 6 hours and 37 minutes.  There were 16.9% of REM sleep recorded.     ? Percent  REM (%): 16.9%                                    ?  ?Respiratory Indices: ?  ?Calculated pAHI (per hour): 36.3/h                          ?  ?REM pAHI:    28.7/h                                           ?NREM pAHI: 37.8/h  ?                         ?Positional AHI:   The patient slept the longest time in supine position with an AHI of 34.7, RDI of 42.7.  This was followed by 20 minutes of sleep in the right lateral position AHI of 51.6/h and 90 minutes in the left position , with the AHI of 49.2/h. ? ?Snoring had a mean volume of 42 dB and was present for 40.7% of total sleep time there were only 2.3 minutes of snoring over 60 dB. ?                                              ?  Oxygen Saturation Statistics: ?  ?O2 Saturation Range (%): Between a nadir at 81% and a maximum saturation of 98% with a mean oxygen saturation of 92%.                                    ?  ?O2 Saturation (minutes) <89%:   3 minutes      ?  ?Pulse Rate Statistics:              ?  ?Pulse Range: Between 49 and 97 bpm and a mean heart rate of 66 bpm.              ?  ?IMPRESSION:  This HST confirms the presence of severe sleep apnea nearly all sleep was recorded in supine sleep position at the end the patient entered several phases of REM sleep.  These were associated with brief hypoxic events, unsustained.  No central apneas were recorded. ?  ?RECOMMENDATION: This patient has been benefiting from trazodone which helps her to fall asleep and somewhat stay asleep.  However she has reported that she did not feel she has restful and restorative sleep at all.  Her sleep architecture shows fragmented sleep for the second half of the night while sleep until 2 AM showed fairly normal architecture.  ? ? I would recommend to treat this patient with autotitration CPAP , with a pressure range from 5 through 16 cmH2O . 2 centimeter H20 for EPR, heated humidification and I would very much prefer to have her fitted in person in the reclined position for a  mask/ interface of her comfort and choice. ?I would also consider a higher dose of trazodone if tolerated.  ? ?  ?INTERPRETING PHYSICIAN: ? ? Larey Seat, MD  ? ?Medical Director of [facility].  ? ? ? ? ? ? ? ? ?

## 2021-10-27 ENCOUNTER — Telehealth: Payer: Self-pay | Admitting: *Deleted

## 2021-10-27 ENCOUNTER — Encounter: Payer: Self-pay | Admitting: *Deleted

## 2021-10-27 ENCOUNTER — Other Ambulatory Visit (HOSPITAL_BASED_OUTPATIENT_CLINIC_OR_DEPARTMENT_OTHER): Payer: Self-pay

## 2021-10-27 DIAGNOSIS — E118 Type 2 diabetes mellitus with unspecified complications: Secondary | ICD-10-CM | POA: Diagnosis not present

## 2021-10-27 DIAGNOSIS — G4700 Insomnia, unspecified: Secondary | ICD-10-CM | POA: Diagnosis not present

## 2021-10-27 DIAGNOSIS — I10 Essential (primary) hypertension: Secondary | ICD-10-CM | POA: Diagnosis not present

## 2021-10-27 DIAGNOSIS — G4733 Obstructive sleep apnea (adult) (pediatric): Secondary | ICD-10-CM | POA: Diagnosis not present

## 2021-10-27 NOTE — Telephone Encounter (Signed)
See other phone note

## 2021-10-27 NOTE — Telephone Encounter (Signed)
-----   Message from Larey Seat, MD sent at 10/26/2021  6:02 PM EDT ----- ?IMPRESSION:  This HST confirms the presence of severe sleep apnea -nearly all sleep was recorded in supine sleep position. The patient entered several phases of REM sleep.  These were associated with brief hypoxic events, unsustained.  No central apneas were recorded. ?? ?RECOMMENDATION: This patient has been benefiting from trazodone which helps her to fall asleep and somewhat stay asleep.  However, she has reported that she did not feel she has restful and restorative sleep at all.  Her sleep architecture shows fragmented sleep for the second half of the night while sleep until 2 AM showed fairly normal architecture.  ?? ? I would recommend to treat this patient with autotitration CPAP , with a pressure range from 5 through 16 cmH2O . 2 centimeter H20 for EPR, heated humidification and I would very much prefer to have her fitted in person in the reclined position for a mask/ interface of her comfort and choice. ? ?I would also consider a higher dose of trazodone if tolerated.  ?? ?

## 2021-10-27 NOTE — Telephone Encounter (Signed)
Pt would like a call from the nurse to discuss sleep study results. ?

## 2021-10-27 NOTE — Telephone Encounter (Signed)
I called pt. I advised pt that Dr. Brett Fairy reviewed their sleep study results and found that pt has severe sleep apnea. Dr. Brett Fairy recommends that pt start auto cpap. I reviewed PAP compliance expectations with the pt. Pt is agreeable to starting a CPAP. I advised pt that an order will be sent to a DME, [facility], and [facility] will call the pt within about one week after they file with the pt's insurance. [facility] will show the pt how to use the machine, fit for masks, and troubleshoot the CPAP if needed. A follow up appt was made for insurance purposes with Dr. Brett Fairy on 01/21/2022 at 8:30am. Pt verbalized understanding to arrive 15 minutes early and bring their CPAP. A letter with all of this information in it will be mailed to the pt as a reminder. I verified with the pt that the address we have on file is correct. Pt verbalized understanding of results. Pt had no questions at this time but was encouraged to call back if questions arise. I have sent the order to [facility] and have received confirmation that they have received the order. ? ?She will stay on current dose of trazodone. Her goal is to eventually wean off of this. She wants to see how adding CPAP therapy goes first.  ? ?

## 2021-10-27 NOTE — Telephone Encounter (Signed)
Results in EPIC.

## 2021-10-28 ENCOUNTER — Other Ambulatory Visit (HOSPITAL_BASED_OUTPATIENT_CLINIC_OR_DEPARTMENT_OTHER): Payer: Self-pay

## 2021-10-28 MED ORDER — TRAZODONE HCL 50 MG PO TABS
ORAL_TABLET | ORAL | 0 refills | Status: DC
  Filled 2021-10-28: qty 90, 90d supply, fill #0

## 2021-10-28 MED ORDER — MOUNJARO 7.5 MG/0.5ML ~~LOC~~ SOAJ
SUBCUTANEOUS | 0 refills | Status: DC
  Filled 2021-10-28 (×3): qty 2, 28d supply, fill #0

## 2021-10-28 MED ORDER — MOUNJARO 10 MG/0.5ML ~~LOC~~ SOAJ
SUBCUTANEOUS | 0 refills | Status: DC
  Filled 2021-11-19: qty 2, 28d supply, fill #0

## 2021-10-28 MED ORDER — SPIRONOLACTONE 25 MG PO TABS
ORAL_TABLET | ORAL | 0 refills | Status: DC
  Filled 2021-10-28: qty 30, 30d supply, fill #0

## 2021-11-06 DIAGNOSIS — G4733 Obstructive sleep apnea (adult) (pediatric): Secondary | ICD-10-CM | POA: Diagnosis not present

## 2021-11-18 ENCOUNTER — Other Ambulatory Visit (HOSPITAL_BASED_OUTPATIENT_CLINIC_OR_DEPARTMENT_OTHER): Payer: Self-pay

## 2021-11-19 ENCOUNTER — Other Ambulatory Visit (HOSPITAL_BASED_OUTPATIENT_CLINIC_OR_DEPARTMENT_OTHER): Payer: Self-pay

## 2021-12-07 ENCOUNTER — Other Ambulatory Visit (HOSPITAL_BASED_OUTPATIENT_CLINIC_OR_DEPARTMENT_OTHER): Payer: Self-pay

## 2021-12-07 DIAGNOSIS — I10 Essential (primary) hypertension: Secondary | ICD-10-CM | POA: Diagnosis not present

## 2021-12-07 DIAGNOSIS — E785 Hyperlipidemia, unspecified: Secondary | ICD-10-CM | POA: Diagnosis not present

## 2021-12-07 DIAGNOSIS — Z9189 Other specified personal risk factors, not elsewhere classified: Secondary | ICD-10-CM | POA: Diagnosis not present

## 2021-12-07 DIAGNOSIS — E559 Vitamin D deficiency, unspecified: Secondary | ICD-10-CM | POA: Diagnosis not present

## 2021-12-07 DIAGNOSIS — G4733 Obstructive sleep apnea (adult) (pediatric): Secondary | ICD-10-CM | POA: Diagnosis not present

## 2021-12-07 DIAGNOSIS — E118 Type 2 diabetes mellitus with unspecified complications: Secondary | ICD-10-CM | POA: Diagnosis not present

## 2021-12-07 MED ORDER — MOUNJARO 15 MG/0.5ML ~~LOC~~ SOAJ
SUBCUTANEOUS | 0 refills | Status: DC
  Filled 2021-12-07 – 2021-12-29 (×5): qty 2, 28d supply, fill #0

## 2021-12-07 MED ORDER — SPIRONOLACTONE 50 MG PO TABS
ORAL_TABLET | ORAL | 0 refills | Status: DC
  Filled 2021-12-07: qty 100, 50d supply, fill #0
  Filled 2021-12-08: qty 80, 40d supply, fill #0

## 2021-12-07 MED ORDER — MOUNJARO 12.5 MG/0.5ML ~~LOC~~ SOAJ
SUBCUTANEOUS | 0 refills | Status: DC
  Filled 2021-12-07: qty 6, 84d supply, fill #0
  Filled 2021-12-29: qty 2, 28d supply, fill #0
  Filled 2021-12-29: qty 6, 84d supply, fill #0

## 2021-12-08 ENCOUNTER — Other Ambulatory Visit (HOSPITAL_BASED_OUTPATIENT_CLINIC_OR_DEPARTMENT_OTHER): Payer: Self-pay

## 2021-12-14 ENCOUNTER — Telehealth: Payer: Self-pay | Admitting: Neurology

## 2021-12-29 ENCOUNTER — Other Ambulatory Visit (HOSPITAL_BASED_OUTPATIENT_CLINIC_OR_DEPARTMENT_OTHER): Payer: Self-pay

## 2022-01-06 DIAGNOSIS — G4733 Obstructive sleep apnea (adult) (pediatric): Secondary | ICD-10-CM | POA: Diagnosis not present

## 2022-01-19 ENCOUNTER — Ambulatory Visit (INDEPENDENT_AMBULATORY_CARE_PROVIDER_SITE_OTHER): Payer: BC Managed Care – PPO | Admitting: Family Medicine

## 2022-01-19 ENCOUNTER — Encounter: Payer: Self-pay | Admitting: Family Medicine

## 2022-01-19 VITALS — BP 152/88 | HR 64 | Ht 63.5 in | Wt 280.0 lb

## 2022-01-19 DIAGNOSIS — G4733 Obstructive sleep apnea (adult) (pediatric): Secondary | ICD-10-CM | POA: Diagnosis not present

## 2022-01-19 DIAGNOSIS — Z9989 Dependence on other enabling machines and devices: Secondary | ICD-10-CM

## 2022-01-19 NOTE — Patient Instructions (Signed)
Please continue using your CPAP regularly. While your insurance requires that you use CPAP at least 4 hours each night on 70% of the nights, I recommend, that you not skip any nights and use it throughout the night if you can. Getting used to CPAP and staying with the treatment long term does take time and patience and discipline. Untreated obstructive sleep apnea when it is moderate to severe can have an adverse impact on cardiovascular health and raise her risk for heart disease, arrhythmias, hypertension, congestive heart failure, stroke and diabetes. Untreated obstructive sleep apnea causes sleep disruption, nonrestorative sleep, and sleep deprivation. This can have an impact on your day to day functioning and cause daytime sleepiness and impairment of cognitive function, memory loss, mood disturbance, and problems focussing. Using CPAP regularly can improve these symptoms.   We will send orders for mask refit. Consider trying a dream wear full face mask  Follow up in 6 months

## 2022-01-19 NOTE — Progress Notes (Signed)
PATIENT: Diana Schultz DOB: 10-07-1965  REASON FOR VISIT: follow up HISTORY FROM: patient  Chief Complaint  Patient presents with   Obstructive Sleep Apnea    Rm 11, alone. Here for initial CPAP f/u. Pt reports overall she doesn't know if settings arent right. States that she has found it difficult to wear. DME [facility] she is a mouth breather and they tried a chin strap which didn't work. States that she was placed a full face     HISTORY OF PRESENT ILLNESS:  01/19/22 ALL:  Diana Schultz is a 56 y.o. female here today for follow up for OSA on CPAP.  She was seen in consult with Dr Brett Fairy 09/2021. She had been previously evaluated for OSA and sleep study was reportedly normal. She continued to have concerns of loud snoring, insomnia and sleepiness. HST 10/14/2021 showed severe OSA with total AHI 36.3/hr and O2 nadir 81%. AutoPAP ordered. She continues to adjust to therapy. She was started on nasal pillow with chin strap but chin strap would not hold mouth shut. She is now using a FFM. She feels that she is suffocating. She is a mouth breather. She does feel less sleepy during the day. She is no longer snoring. She has lost 23lbs.     HISTORY: (copied from Dr Dohmeier's previous note)  Diana Schultz is a 56 y.o.  Biracial female patient seen here as a referral on 09/22/2021 from weight and wellness  Chief concern according to patient :  Pt referred for sleep consult, morbid obesity and sleep disorder.  Previous PSG done in 2019, no sleep apnea then at [facility]. In lab test- . Pt c/o of insomnia,loud snoring, and sleepiness. She reports her mind racing and her husband reports her snoring loudly, but she feels she wasn't sleeping.   May be due to the medications she is on. Works now day shift. Here to r/o sleep apnea. 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    Her sleep test is listed in EPIC as a SPLIT night but no results were  filed .    09-22-2021 Diana Schultz  has a past medical history of Adrenal adenoma, Anemia, Arteriovenous malformation, Depression, Diabetes mellitus without complication ([place]), Diverticulitis, DVT (deep venous thrombosis) ([place]), Fatty liver, Gallbladder disease, Hypertension, Lactose intolerance, Morbid obesity ([place]), Ovarian cyst, Prediabetes, Thalassemia, Umbilical hernia, Vaginal delivery (1984, 1989), and Vitamin D deficiency..    Sleep relevant medical history: Nocturia 3-4 times (!) slowed down on Ozempic.     Family medical /sleep history: No other family member on CPAP with OSA, neither insomnia, nor sleep walkers.    Social history:  Patient is working as Office manager- and lives in a household with spouse, grown children , 61 and 27. 1 grandchild, 57 years old.  The patient currently works now in daytime,  used to work in shifts( Presenter, broadcasting,) Pets : 2 dogs present. Tobacco use-none .   ETOH use : none ,  Caffeine intake in form of Coffee( /) Soda( less frequent- on Ozempic) Tea ( sweet tea, 16 ounces) or energy drinks. Regular exercise in form of walking.   Hobbies :none    Sleep habits are as follows: The patient's dinner time is between 5.30- 6 PM. The patient goes to bed at 9-9.30 PM and struggles to fall asleep, just started on trazodone- continues to sleep for intervals of 1-2 hours, wakes for  many, bathroom breaks, the first time at 1 AM.   The preferred sleep position is laterally-, with the support of 2 pillows. On a wedge- orthopnea. GERD at night.  Dreams are reportedly frequent/vivid. She wakes from headaches, from feeling hypoglycemic, from acid reflux and nocturia.  5-6  AM is the usual rise time. The patient wakes up spontaneously before her alarm.  She reports not feeling refreshed or restored in AM, with symptoms such as dry mouth, morning headaches, and residual fatigue.  Naps are taken infrequently, lasting from 15 to 25 minutes and are more  refreshing  than nocturnal sleep.   REVIEW OF SYSTEMS: Out of a complete 14 system review of symptoms, the patient complains only of the following symptoms, fatigue and all other reviewed systems are negative.  ESS: now 7/24, previously 5/24  ALLERGIES: Allergies  Allergen Reactions   Amlodipine Cough and Swelling    Other reaction(s): Cough (ALLERGY/intolerance)    Metoprolol Swelling   Benicar [Olmesartan] Cough   Hydrochlorothiazide     Other reaction(s): Cough (ALLERGY/intolerance)   Lactose Intolerance (Gi)    Lisinopril Cough   Nebivolol     Other reaction(s): Other (See Comments) bradycardia   Semaglutide(0.25 Or 0.'5mg'$ -Dos) Itching    Itching all over    HOME MEDICATIONS: Outpatient Medications Prior to Visit  Medication Sig Dispense Refill   spironolactone (ALDACTONE) 50 MG tablet Take 1 - 2 tablet by mouth Once a day 90 days 180 tablet 0   tirzepatide (MOUNJARO) 12.5 MG/0.5ML Pen Inject 12.5 mg     tirzepatide (MOUNJARO) 15 MG/0.5ML Pen Inject '15mg'$      Insulin Pen Needle 32G X 4 MM MISC Use 1 each as directed once a week. 100 each 0   nystatin ointment (MYCOSTATIN) Apply 1 application. topically 2 (two) times daily. 30 g 0   spironolactone (ALDACTONE) 25 MG tablet Take 1 tablet by mouth once daily 30 tablet 0   tirzepatide (MOUNJARO) 10 MG/0.5ML Pen Inject '10mg'$  under the skin once weekly 2 mL 0   tirzepatide (MOUNJARO) 12.5 MG/0.5ML Pen Inject 12.5 mg into the skin once a week 84 days 6 mL 0   tirzepatide (MOUNJARO) 15 MG/0.5ML Pen Inject '15mg'$  into the skin once a week 6 mL 0   tirzepatide (MOUNJARO) 5 MG/0.5ML Pen Inject 5 mg into the skin once a week. 6 mL 0   tirzepatide (MOUNJARO) 7.5 MG/0.5ML Pen Inject 7.'5mg'$  under the skin once weekly 2 mL 0   traZODone (DESYREL) 50 MG tablet Take 0.5-1 tablets (25-50 mg total) by mouth at bedtime as needed for sleep. 30 tablet 0   traZODone (DESYREL) 50 MG tablet Take 1 tablet by mouth daily at bedtime as needed. 90 tablet 0   venlafaxine  XR (EFFEXOR XR) 37.5 MG 24 hr capsule Take 1 capsule (37.5 mg total) by mouth daily with breakfast. 30 capsule 3   No facility-administered medications prior to visit.    PAST MEDICAL HISTORY: Past Medical History:  Diagnosis Date   Adrenal adenoma    Anemia    Arteriovenous malformation    Depression    Diabetes mellitus without complication (HCC)    borderline - no meds   Diverticulitis    DVT (deep venous thrombosis) (HCC)    Hx at age 35 blood clot in right leg, d/c birth control pills and no other problems   Fatty liver    Gallbladder disease    Hypertension    history,lost weight, controlled with diet and exercise, no current  med   Lactose intolerance    Morbid obesity (HCC)    Ovarian cyst    Prediabetes    Thalassemia    Umbilical hernia    Vaginal delivery 1984, 1989   Vitamin D deficiency     PAST SURGICAL HISTORY: Past Surgical History:  Procedure Laterality Date   ABDOMINAL HYSTERECTOMY     ABLATION  03/23/13   [place]   APPENDECTOMY     CHOLECYSTECTOMY     DILATION AND CURETTAGE OF UTERUS     DILATION AND CURETTAGE OF UTERUS N/A 11/01/2013   Procedure: DILATATION AND CURETTAGE;  Surgeon: Mora Bellman, MD;  Location: [facility];  Service: Gynecology;  Laterality: N/A;   DILITATION & CURRETTAGE/HYSTROSCOPY WITH NOVASURE ABLATION N/A 03/23/2013   Procedure: DILATATION & CURETTAGE/HYSTEROSCOPY WITH NOVASURE ABLATION;  Surgeon: Lavonia Drafts, MD;  Location: [facility];  Service: Gynecology;  Laterality: N/A;   LEEP     TUBAL LIGATION     VAGINAL HYSTERECTOMY N/A 12/31/2013   Procedure: HYSTERECTOMY VAGINAL;  Surgeon: Lavonia Drafts, MD;  Location: [facility];  Service: Gynecology;  Laterality: N/A;    FAMILY HISTORY: Family History  Problem Relation Age of Onset   Cancer Mother    Hypertension Mother    Diabetes Mother    Stroke Mother        early in life   Endometrial cancer Mother    Hyperlipidemia Mother    Depression Mother    Mental retardation  Father    Breast cancer Father    Depression Father    Hypertension Sister    Hypertension Brother    Heart failure Paternal Grandfather    CAD Other        GP, late in like   Colon cancer Neg Hx     SOCIAL HISTORY: Social History   Socioeconomic History   Marital status: Married    Spouse name: Mateo Flow   Number of children: 2   Years of education: Not on file   Highest education level: Some college, no degree  Occupational History   Occupation: Quarry manager, Web designer  by training, does prn works  Tobacco Use   Smoking status: Never   Smokeless tobacco: Never  Vaping Use   Vaping Use: Never used  Substance and Sexual Activity   Alcohol use: No    Comment: rare    Drug use: No   Sexual activity: Not Currently    Birth control/protection: Surgical  Other Topics Concern   Not on file  Social History Narrative   Original from [place], household: pt and husband   Lives at home with husband and children   Right handed   Caffeine: 1 cup sweet tea every day   Social Determinants of Health   Financial Resource Strain: Not on file  Food Insecurity: Not on file  Transportation Needs: Not on file  Physical Activity: Not on file  Stress: Not on file  Social Connections: Not on file  Intimate Partner Violence: Not on file     PHYSICAL EXAM  Vitals:   01/19/22 0823  BP: (!) 152/88  Pulse: 64  Weight: 280 lb (127 kg)  Height: 5' 3.5" (1.613 m)   Body mass index is 48.82 kg/m.  Generalized: Well developed, in no acute distress  Cardiology: normal rate and rhythm, no murmur noted Respiratory: clear to auscultation bilaterally  Neurological examination  Mentation: Alert oriented to time, place, history taking. Follows all commands speech and language fluent Cranial nerve II-XII: Pupils were equal round  reactive to light. Extraocular movements were full, visual field were full  Motor: The motor testing reveals 5 over 5 strength of all 4 extremities. Good symmetric motor tone is  noted throughout.  Gait and station: Gait is normal.    DIAGNOSTIC DATA (LABS, IMAGING, TESTING) - I reviewed patient records, labs, notes, testing and imaging myself where available.      No data to display           Lab Results  Component Value Date   WBC 9.6 07/21/2021   HGB 12.9 07/21/2021   HCT 40.6 07/21/2021   MCV 76.7 (L) 07/21/2021   PLT 218 07/21/2021      Component Value Date/Time   NA 140 07/21/2021 1947   NA 138 09/11/2019 1251   K 4.3 07/21/2021 1947   CL 104 07/21/2021 1947   CO2 28 07/21/2021 1947   GLUCOSE 110 (H) 07/21/2021 1947   BUN 19 07/21/2021 1947   BUN 18 09/11/2019 1251   CREATININE 1.08 (H) 07/21/2021 1947   CALCIUM 9.3 07/21/2021 1947   PROT 7.1 07/08/2020 1130   PROT 7.3 09/11/2019 1251   ALBUMIN 3.7 07/08/2020 1130   ALBUMIN 4.3 09/11/2019 1251   AST 17 07/08/2020 1130   ALT 21 07/08/2020 1130   ALKPHOS 61 07/08/2020 1130   BILITOT 0.5 07/08/2020 1130   BILITOT 0.7 09/11/2019 1251   GFRNONAA 60 (L) 07/21/2021 1947   GFRAA 89 09/11/2019 1251   Lab Results  Component Value Date   CHOL 158 09/11/2019   HDL 58 09/11/2019   LDLCALC 84 09/11/2019   TRIG 87 09/11/2019   CHOLHDL 2.7 09/11/2019   Lab Results  Component Value Date   HGBA1C 6.0 (H) 09/11/2019   Lab Results  Component Value Date   QMVHQION62 952 09/11/2019   Lab Results  Component Value Date   TSH 2.140 09/11/2019     ASSESSMENT AND PLAN 56 y.o. year old female  has a past medical history of Adrenal adenoma, Anemia, Arteriovenous malformation, Depression, Diabetes mellitus without complication ([place]), Diverticulitis, DVT (deep venous thrombosis) ([place]), Fatty liver, Gallbladder disease, Hypertension, Lactose intolerance, Morbid obesity ([place]), Ovarian cyst, Prediabetes, Thalassemia, Umbilical hernia, Vaginal delivery (1984, 1989), and Vitamin D deficiency. here with     ICD-10-CM   1. OSA on CPAP  G47.33 For home use only DME continuous positive airway pressure  (CPAP)   Z99.89         Diana Schultz is doing well on CPAP therapy. Compliance report reveals excellent compliance. She was encouraged to continue using CPAP nightly and for greater than 4 hours each night. We will send orders for a mask refit. I would like for her to try the [employer] FFM. We will update supply orders as indicated. Risks of untreated sleep apnea review and education materials provided. Healthy lifestyle habits encouraged. She will follow up in 6 months, sooner if needed. She verbalizes understanding and agreement with this plan.    Orders Placed This Encounter  Procedures   For home use only DME continuous positive airway pressure (CPAP)    Mask refit, Consider Dreamwear FFM    Order Specific Question:   Length of Need    Answer:   Lifetime    Order Specific Question:   Patient has OSA or probable OSA    Answer:   Yes    Order Specific Question:   Is the patient currently using CPAP in the home    Answer:   Yes  Order Specific Question:   Settings    Answer:   Other see comments    Order Specific Question:   CPAP supplies needed    Answer:   Mask, headgear, cushions, filters, heated tubing and water chamber     No orders of the defined types were placed in this encounter.     Debbora Presto, FNP-C 01/19/2022, 8:55 AM [facility] [address], [place], [place] [address] [phone]

## 2022-01-21 ENCOUNTER — Ambulatory Visit: Payer: BC Managed Care – PPO | Admitting: Neurology

## 2022-01-25 DIAGNOSIS — E118 Type 2 diabetes mellitus with unspecified complications: Secondary | ICD-10-CM | POA: Diagnosis not present

## 2022-01-25 DIAGNOSIS — E65 Localized adiposity: Secondary | ICD-10-CM | POA: Diagnosis not present

## 2022-01-25 DIAGNOSIS — I10 Essential (primary) hypertension: Secondary | ICD-10-CM | POA: Diagnosis not present

## 2022-01-25 DIAGNOSIS — Z9189 Other specified personal risk factors, not elsewhere classified: Secondary | ICD-10-CM | POA: Diagnosis not present

## 2022-01-25 DIAGNOSIS — G4733 Obstructive sleep apnea (adult) (pediatric): Secondary | ICD-10-CM | POA: Diagnosis not present

## 2022-01-26 ENCOUNTER — Other Ambulatory Visit (HOSPITAL_BASED_OUTPATIENT_CLINIC_OR_DEPARTMENT_OTHER): Payer: Self-pay

## 2022-01-26 MED ORDER — MOUNJARO 15 MG/0.5ML ~~LOC~~ SOAJ
SUBCUTANEOUS | 2 refills | Status: DC
  Filled 2022-01-26: qty 2, 28d supply, fill #0

## 2022-01-26 MED ORDER — VITAMIN D (ERGOCALCIFEROL) 50000 UNITS PO CAPS
ORAL_CAPSULE | ORAL | 0 refills | Status: DC
  Filled 2022-01-26: qty 12, 84d supply, fill #0

## 2022-01-26 MED ORDER — MOUNJARO 12.5 MG/0.5ML ~~LOC~~ SOAJ: SUBCUTANEOUS | 2 refills | Status: DC

## 2022-01-27 ENCOUNTER — Encounter (INDEPENDENT_AMBULATORY_CARE_PROVIDER_SITE_OTHER): Payer: Self-pay

## 2022-02-05 DIAGNOSIS — G4733 Obstructive sleep apnea (adult) (pediatric): Secondary | ICD-10-CM | POA: Diagnosis not present

## 2022-02-06 DIAGNOSIS — G4733 Obstructive sleep apnea (adult) (pediatric): Secondary | ICD-10-CM | POA: Diagnosis not present

## 2022-02-17 ENCOUNTER — Other Ambulatory Visit (HOSPITAL_BASED_OUTPATIENT_CLINIC_OR_DEPARTMENT_OTHER): Payer: Self-pay

## 2022-02-17 MED ORDER — MOUNJARO 15 MG/0.5ML ~~LOC~~ SOAJ
15.0000 mg | SUBCUTANEOUS | 2 refills | Status: DC
  Filled 2022-02-17: qty 2, 28d supply, fill #0

## 2022-02-24 ENCOUNTER — Other Ambulatory Visit (HOSPITAL_BASED_OUTPATIENT_CLINIC_OR_DEPARTMENT_OTHER): Payer: Self-pay

## 2022-02-24 DIAGNOSIS — R0989 Other specified symptoms and signs involving the circulatory and respiratory systems: Secondary | ICD-10-CM | POA: Diagnosis not present

## 2022-02-24 DIAGNOSIS — E65 Localized adiposity: Secondary | ICD-10-CM | POA: Diagnosis not present

## 2022-02-24 DIAGNOSIS — G4733 Obstructive sleep apnea (adult) (pediatric): Secondary | ICD-10-CM | POA: Diagnosis not present

## 2022-02-24 DIAGNOSIS — E118 Type 2 diabetes mellitus with unspecified complications: Secondary | ICD-10-CM | POA: Diagnosis not present

## 2022-02-24 MED ORDER — VALSARTAN-HYDROCHLOROTHIAZIDE 80-12.5 MG PO TABS
ORAL_TABLET | ORAL | 3 refills | Status: DC
  Filled 2022-02-24: qty 30, 30d supply, fill #0
  Filled 2022-03-24: qty 30, 30d supply, fill #1
  Filled 2022-03-25: qty 90, 90d supply, fill #1

## 2022-02-24 MED ORDER — MOUNJARO 15 MG/0.5ML ~~LOC~~ SOAJ
15.0000 mg | SUBCUTANEOUS | 2 refills | Status: DC
  Filled 2022-02-24: qty 2, 28d supply, fill #0
  Filled 2022-03-01 – 2022-03-15 (×2): qty 6, 84d supply, fill #0

## 2022-03-01 ENCOUNTER — Other Ambulatory Visit (HOSPITAL_BASED_OUTPATIENT_CLINIC_OR_DEPARTMENT_OTHER): Payer: Self-pay

## 2022-03-01 MED ORDER — MOUNJARO 12.5 MG/0.5ML ~~LOC~~ SOAJ
SUBCUTANEOUS | 2 refills | Status: DC
  Filled 2022-03-01: qty 2, 30d supply, fill #0

## 2022-03-09 DIAGNOSIS — G4733 Obstructive sleep apnea (adult) (pediatric): Secondary | ICD-10-CM | POA: Diagnosis not present

## 2022-03-15 ENCOUNTER — Other Ambulatory Visit (HOSPITAL_BASED_OUTPATIENT_CLINIC_OR_DEPARTMENT_OTHER): Payer: Self-pay

## 2022-03-16 ENCOUNTER — Other Ambulatory Visit (HOSPITAL_BASED_OUTPATIENT_CLINIC_OR_DEPARTMENT_OTHER): Payer: Self-pay

## 2022-03-17 ENCOUNTER — Other Ambulatory Visit: Payer: Self-pay | Admitting: Internal Medicine

## 2022-03-17 DIAGNOSIS — Z139 Encounter for screening, unspecified: Secondary | ICD-10-CM

## 2022-03-18 ENCOUNTER — Emergency Department (HOSPITAL_BASED_OUTPATIENT_CLINIC_OR_DEPARTMENT_OTHER)
Admission: EM | Admit: 2022-03-18 | Discharge: 2022-03-18 | Disposition: A | Discharge status: Home / Self Care | Payer: BC Managed Care – PPO | Attending: Emergency Medicine | Admitting: Emergency Medicine

## 2022-03-18 ENCOUNTER — Emergency Department (HOSPITAL_BASED_OUTPATIENT_CLINIC_OR_DEPARTMENT_OTHER): Payer: BC Managed Care – PPO

## 2022-03-18 ENCOUNTER — Other Ambulatory Visit (HOSPITAL_BASED_OUTPATIENT_CLINIC_OR_DEPARTMENT_OTHER): Payer: Self-pay

## 2022-03-18 ENCOUNTER — Encounter (HOSPITAL_BASED_OUTPATIENT_CLINIC_OR_DEPARTMENT_OTHER): Payer: Self-pay

## 2022-03-18 DIAGNOSIS — R1032 Left lower quadrant pain: Secondary | ICD-10-CM | POA: Diagnosis not present

## 2022-03-18 DIAGNOSIS — N132 Hydronephrosis with renal and ureteral calculous obstruction: Secondary | ICD-10-CM | POA: Diagnosis not present

## 2022-03-18 DIAGNOSIS — D72829 Elevated white blood cell count, unspecified: Secondary | ICD-10-CM | POA: Insufficient documentation

## 2022-03-18 DIAGNOSIS — I10 Essential (primary) hypertension: Secondary | ICD-10-CM | POA: Insufficient documentation

## 2022-03-18 DIAGNOSIS — Z79899 Other long term (current) drug therapy: Secondary | ICD-10-CM | POA: Diagnosis not present

## 2022-03-18 DIAGNOSIS — N201 Calculus of ureter: Secondary | ICD-10-CM

## 2022-03-18 DIAGNOSIS — D569 Thalassemia, unspecified: Secondary | ICD-10-CM | POA: Insufficient documentation

## 2022-03-18 LAB — URINALYSIS, ROUTINE W REFLEX MICROSCOPIC
Bilirubin Urine: NEGATIVE
Glucose, UA: NEGATIVE mg/dL
Ketones, ur: 15 mg/dL — AB
Leukocytes,Ua: NEGATIVE
Nitrite: NEGATIVE
Protein, ur: NEGATIVE mg/dL
Specific Gravity, Urine: 1.025 (ref 1.005–1.030)
pH: 5.5 (ref 5.0–8.0)

## 2022-03-18 LAB — CBC WITH DIFFERENTIAL/PLATELET
Abs Immature Granulocytes: 0.07 10*3/uL (ref 0.00–0.07)
Basophils Absolute: 0 10*3/uL (ref 0.0–0.1)
Basophils Relative: 0 %
Eosinophils Absolute: 0 10*3/uL (ref 0.0–0.5)
Eosinophils Relative: 0 %
HCT: 42.7 % (ref 36.0–46.0)
Hemoglobin: 13.9 g/dL (ref 12.0–15.0)
Immature Granulocytes: 1 %
Lymphocytes Relative: 5 %
Lymphs Abs: 0.8 10*3/uL (ref 0.7–4.0)
MCH: 25.2 pg — ABNORMAL LOW (ref 26.0–34.0)
MCHC: 32.6 g/dL (ref 30.0–36.0)
MCV: 77.5 fL — ABNORMAL LOW (ref 80.0–100.0)
Monocytes Absolute: 0.5 10*3/uL (ref 0.1–1.0)
Monocytes Relative: 4 %
Neutro Abs: 13.3 10*3/uL — ABNORMAL HIGH (ref 1.7–7.7)
Neutrophils Relative %: 90 %
Platelets: 246 10*3/uL (ref 150–400)
RBC: 5.51 MIL/uL — ABNORMAL HIGH (ref 3.87–5.11)
RDW: 15.6 % — ABNORMAL HIGH (ref 11.5–15.5)
WBC: 14.7 10*3/uL — ABNORMAL HIGH (ref 4.0–10.5)
nRBC: 0 % (ref 0.0–0.2)

## 2022-03-18 LAB — URINALYSIS, MICROSCOPIC (REFLEX)

## 2022-03-18 LAB — COMPREHENSIVE METABOLIC PANEL
ALT: 24 U/L (ref 0–44)
AST: 27 U/L (ref 15–41)
Albumin: 4.3 g/dL (ref 3.5–5.0)
Alkaline Phosphatase: 66 U/L (ref 38–126)
Anion gap: 9 (ref 5–15)
BUN: 23 mg/dL — ABNORMAL HIGH (ref 6–20)
CO2: 25 mmol/L (ref 22–32)
Calcium: 9.6 mg/dL (ref 8.9–10.3)
Chloride: 106 mmol/L (ref 98–111)
Creatinine, Ser: 1.07 mg/dL — ABNORMAL HIGH (ref 0.44–1.00)
GFR, Estimated: >60 mL/min (ref >60)
Glucose, Bld: 134 mg/dL — ABNORMAL HIGH (ref 70–99)
Potassium: 3.7 mmol/L (ref 3.5–5.1)
Sodium: 140 mmol/L (ref 135–145)
Total Bilirubin: 0.9 mg/dL (ref 0.3–1.2)
Total Protein: 8.5 g/dL — ABNORMAL HIGH (ref 6.5–8.1)

## 2022-03-18 MED ORDER — TAMSULOSIN HCL 0.4 MG PO CAPS
0.4000 mg | ORAL_CAPSULE | Freq: Every day | ORAL | 0 refills | Status: AC
  Filled 2022-03-18: qty 14, 14d supply, fill #0

## 2022-03-18 MED ORDER — LACTATED RINGERS IV BOLUS
1000.0000 mL | Freq: Once | INTRAVENOUS | Status: AC
  Administered 2022-03-18: 1000 mL via INTRAVENOUS

## 2022-03-18 MED ORDER — KETOROLAC TROMETHAMINE 15 MG/ML IJ SOLN
30.0000 mg | Freq: Once | INTRAMUSCULAR | Status: AC
  Administered 2022-03-18: 30 mg via INTRAVENOUS
  Filled 2022-03-18: qty 2

## 2022-03-18 MED ORDER — HYDROMORPHONE HCL 1 MG/ML IJ SOLN
1.0000 mg | Freq: Once | INTRAMUSCULAR | Status: AC
  Administered 2022-03-18: 1 mg via INTRAVENOUS
  Filled 2022-03-18: qty 1

## 2022-03-18 MED ORDER — ONDANSETRON HCL 4 MG/2ML IJ SOLN
4.0000 mg | Freq: Once | INTRAMUSCULAR | Status: AC
  Administered 2022-03-18: 4 mg via INTRAVENOUS
  Filled 2022-03-18: qty 2

## 2022-03-18 MED ORDER — OXYCODONE HCL 5 MG PO TABS
5.0000 mg | ORAL_TABLET | ORAL | 0 refills | Status: DC | PRN
  Filled 2022-03-18: qty 20, 4d supply, fill #0

## 2022-03-18 MED ORDER — ONDANSETRON 4 MG PO TBDP
4.0000 mg | ORAL_TABLET | Freq: Three times a day (TID) | ORAL | 0 refills | Status: DC | PRN
  Filled 2022-03-18: qty 20, 7d supply, fill #0

## 2022-03-18 NOTE — ED Provider Notes (Signed)
[facility] EMERGENCY DEPARTMENT Provider Note   CSN: [id number] Arrival date & time: 03/18/22  6010     History  Chief Complaint  Patient presents with   Flank Pain    Diana Schultz is a 56 y.o. female.  HPI       56 yo female with a pmhx of HTN, diverticulitis, remote DVT, borderline DM, and ovarian cysts who presents to the ED today for constant, progressively worsening left abdominal pain.  Began around 2AM. She describes the pain as a 10/10, sharp sensation that radiates into her LLQ region. She reports associated lower abdominal pressure and 2 episodes of emesis. She states that she was awoken out of her sleep this morning around 2 am with the severe pain. She also expresses she had one episode of bowel urgency, noting "nothing would come out". Tylenol was taken to alleviate her symptoms without relief. No aggravating or alleviating factors for her pain. She has not had similar pain in the past. She denies fever, chills, n/d/c, cp, sob, dysuria, hematuria, urinary urgency/frequency, and weakness.    Past Medical History:  Diagnosis Date   Adrenal adenoma    Anemia    Arteriovenous malformation    Depression    Diabetes mellitus without complication (HCC)    borderline - no meds   Diverticulitis    DVT (deep venous thrombosis) (HCC)    Hx at age 73 blood clot in right leg, d/c birth control pills and no other problems   Fatty liver    Gallbladder disease    Hypertension    history,lost weight, controlled with diet and exercise, no current med   Lactose intolerance    Morbid obesity ([place])    Ovarian cyst    Prediabetes    Thalassemia    Umbilical hernia    Vaginal delivery 1984, 1989   Vitamin D deficiency     Home Medications Prior to Admission medications   Medication Sig Start Date End Date Taking? Authorizing Provider  ondansetron (ZOFRAN-ODT) 4 MG disintegrating tablet Take 1 tablet (4 mg total) by mouth every 8 (eight) hours as needed for  nausea or vomiting. 03/18/22  Yes Gareth Morgan, MD  oxyCODONE (ROXICODONE) 5 MG immediate release tablet Take 1 tablet (5 mg total) by mouth every 4 (four) hours as needed for severe pain. 03/18/22  Yes Gareth Morgan, MD  tamsulosin (FLOMAX) 0.4 MG CAPS capsule Take 1 capsule (0.4 mg total) by mouth daily for 14 days. 03/18/22 04/01/22 Yes Gareth Morgan, MD  spironolactone (ALDACTONE) 50 MG tablet Take 1 - 2 tablet by mouth Once a day 90 days 12/07/21     tirzepatide [facility]) 12.5 MG/0.5ML Pen Inject 12.5 mg 12/07/21   [provider]  tirzepatide [facility]) 12.5 MG/0.5ML Pen Inject 12.5 mg Subcutaneous once a week 30 days 01/26/22     tirzepatide [facility]) 12.5 MG/0.5ML Pen Inject 12.5 mg Subcutaneous once a week 30 days 02/28/22     tirzepatide [facility]) 15 MG/0.5ML Pen Inject '15mg'$  12/07/21   [provider]  tirzepatide [facility]) 15 MG/0.5ML Pen Inject '15mg'$  Subcutaneous once a week 30 days 01/26/22     tirzepatide (MOUNJARO) 15 MG/0.5ML Pen Inject 15 mg into the skin once a week. 02/17/22     tirzepatide (MOUNJARO) 15 MG/0.5ML Pen Inject 15 mg into the skin once a week. 02/24/22     valsartan-hydrochlorothiazide (DIOVAN-HCT) 80-12.5 MG tablet 1 tablet Orally Once a day 30 day(s) 02/24/22     Vitamin D, Ergocalciferol, 50000  units CAPS 1 capsule Orally Once a Week 90 days 01/26/22         Allergies    Amlodipine, Metoprolol, Benicar [olmesartan], Hydrochlorothiazide, Lactose intolerance (gi), Lisinopril, Nebivolol, and Semaglutide(0.25 or 0.'5mg'$ -dos)    Review of Systems   Review of Systems  Physical Exam Updated Vital Signs BP (!) 173/92   Pulse 77   Temp 98 F (36.7 C) (Oral)   Resp 20   Ht 5' 3.5" (1.613 m)   Wt 120.7 kg   LMP 12/01/2013 Comment: hx ablation 03/23/2013  SpO2 97%   BMI 46.38 kg/m  Physical Exam Vitals and nursing note reviewed.  Constitutional:      General: She is not in acute distress.    Appearance: She is well-developed. She is not  diaphoretic.  HENT:     Head: Normocephalic and atraumatic.  Eyes:     Conjunctiva/sclera: Conjunctivae normal.  Cardiovascular:     Rate and Rhythm: Normal rate and regular rhythm.     Heart sounds: Normal heart sounds. No murmur heard.    No friction rub. No gallop.  Pulmonary:     Effort: Pulmonary effort is normal. No respiratory distress.     Breath sounds: Normal breath sounds. No wheezing or rales.  Abdominal:     General: There is no distension.     Palpations: Abdomen is soft.     Tenderness: There is abdominal tenderness (LLQ). There is no guarding.  Musculoskeletal:        General: No tenderness.     Cervical back: Normal range of motion.  Skin:    General: Skin is warm and dry.     Findings: No erythema or rash.  Neurological:     Mental Status: She is alert and oriented to person, place, and time.     ED Results / Procedures / Treatments   Labs (all labs ordered are listed, but only abnormal results are displayed) Labs Reviewed  URINE CULTURE - Abnormal; Notable for the following components:      Result Value   Culture MULTIPLE SPECIES PRESENT, SUGGEST RECOLLECTION (*)    All other components within normal limits  CBC WITH DIFFERENTIAL/PLATELET - Abnormal; Notable for the following components:   WBC 14.7 (*)    RBC 5.51 (*)    MCV 77.5 (*)    MCH 25.2 (*)    RDW 15.6 (*)    Neutro Abs 13.3 (*)    All other components within normal limits  COMPREHENSIVE METABOLIC PANEL - Abnormal; Notable for the following components:   Glucose, Bld 134 (*)    BUN 23 (*)    Creatinine, Ser 1.07 (*)    Total Protein 8.5 (*)    All other components within normal limits  URINALYSIS, ROUTINE W REFLEX MICROSCOPIC - Abnormal; Notable for the following components:   Hgb urine dipstick SMALL (*)    Ketones, ur 15 (*)    All other components within normal limits  URINALYSIS, MICROSCOPIC (REFLEX) - Abnormal; Notable for the following components:   Bacteria, UA RARE (*)    All  other components within normal limits    EKG EKG Interpretation  Date/Time:  Thursday March 18 2022 09:23:27 EDT Ventricular Rate:  83 PR Interval:  211 QRS Duration: 97 QT Interval:  394 QTC Calculation: 463 R Axis:   1 Text Interpretation: Sinus rhythm Prolonged PR interval No significant change since last tracing Confirmed by Gareth Morgan [phone] on 03/18/2022 9:29:16 AM  Radiology CT Renal Joaquim Lai Study  Addendum Date: 03/18/2022   ADDENDUM REPORT: 03/18/2022 10:04 ADDENDUM: Additional finding: There is a stable small left adrenal adenoma which measures 1.1 cm, previously 1.1 cm. This requires no follow-up imaging. Electronically Signed   By: Maurine Simmering M.D.   On: 03/18/2022 10:04   Result Date: 03/18/2022 CLINICAL DATA:  Flank pain, kidney stone suspected EXAM: CT ABDOMEN AND PELVIS WITHOUT CONTRAST TECHNIQUE: Multidetector CT imaging of the abdomen and pelvis was performed following the standard protocol without IV contrast. RADIATION DOSE REDUCTION: This exam was performed according to the departmental dose-optimization program which includes automated exposure control, adjustment of the mA and/or kV according to patient size and/or use of iterative reconstruction technique. COMPARISON:  CT abdomen pelvis 07/08/2020 FINDINGS: Lower chest: No acute abnormality. Hepatobiliary: No focal liver abnormality is seen. Prior cholecystectomy. Pancreas: No ductal dilation or peripancreatic inflammatory change. Spleen: Normal in size without focal abnormality. Adrenals/Urinary Tract: Adrenal glands are unremarkable. There is left-sided hydroureteronephrosis due to an obstructing 4 mm stone at the left ureterovesicular junction. There is mild periureteral and perinephric stranding. The bladder is minimally distended. No additional renal stones identified. Stomach/Bowel: The stomach is within normal limits. There is no evidence of bowel obstruction.Prior appendectomy. Is scattered diverticula.  Vascular/Lymphatic: No significant vascular findings are present. No enlarged abdominal or pelvic lymph nodes. Reproductive: Prior hysterectomy. Other: No abdominal wall hernia or abnormality. No abdominopelvic ascites. Musculoskeletal: No acute osseous abnormality. No suspicious osseous lesion. IMPRESSION: Obstructing 4 mm stone at the left ureterovesicular junction, with upstream hydroureteronephrosis, perinephric and periureteral stranding. Electronically Signed: By: Maurine Simmering M.D. On: 03/18/2022 09:49    Procedures Procedures    Medications Ordered in ED Medications  HYDROmorphone (DILAUDID) injection 1 mg (1 mg Intravenous Given 03/18/22 0913)  lactated ringers bolus 1,000 mL ( Intravenous Stopped 03/18/22 1121)  ondansetron (ZOFRAN) injection 4 mg (4 mg Intravenous Given 03/18/22 0913)  ketorolac (TORADOL) 15 MG/ML injection 30 mg (30 mg Intravenous Given 03/18/22 1027)    ED Course/ Medical Decision Making/ A&P                            56 yo female with a pmhx of HTN, diverticulitis, remote DVT, borderline DM, and ovarian cysts who presents with LLQ abdominal pain.  DDx includes appendicitis, pancreatitis, cholecystitis, pyelonephritis, nephrolithiasis, diverticulitis, AAA, SBO.  Labs completed and interpreted by me.  .  Urinalysis without signs of urinary tract infection.  CBC with leukocytosis without anemia.  Normal GFR, creatinine at baseline.   CT completed and personally by and interpreted by me shows obstructing 4 mm stone at the left UVJ with upstream hydroureteronephrosis perinephric and periureteral stranding.  Pain improved initially with pain control emergency department, worsened, improved again with additional pain control.  Recommend continued hydration and supportive care.  During these and reviewed risks of narcotic prescriptions.  Given prescription for oxycodone, recommend taking ibuprofen, Tylenol, given prescription for Flomax and Zofran.  Recommend close  follow-up with urology. Discussed return precautions. Patient discharged in stable condition with understanding of reasons to return.          Final Clinical Impression(s) / ED Diagnoses Final diagnoses:  Ureterolithiasis    Rx / DC Orders ED Discharge Orders          Ordered    oxyCODONE (ROXICODONE) 5 MG immediate release tablet  Every 4 hours PRN        03/18/22 1130    ondansetron (ZOFRAN-ODT) 4 MG  disintegrating tablet  Every 8 hours PRN        03/18/22 1130    tamsulosin (FLOMAX) 0.4 MG CAPS capsule  Daily        03/18/22 1130              Gareth Morgan, MD 03/19/22 2309

## 2022-03-18 NOTE — ED Triage Notes (Signed)
C/o left flank pain radiating to abdomen since 1200 with urinary pressure and emesis.

## 2022-03-18 NOTE — ED Notes (Signed)
Discharge instructions reviewed with patient. Patient verbalizes understanding, no further questions at this time. Medications/prescriptions and follow up information provided. No acute distress noted at time of departure.  

## 2022-03-19 LAB — URINE CULTURE

## 2022-03-22 ENCOUNTER — Other Ambulatory Visit (HOSPITAL_BASED_OUTPATIENT_CLINIC_OR_DEPARTMENT_OTHER): Payer: Self-pay

## 2022-03-22 DIAGNOSIS — N3021 Other chronic cystitis with hematuria: Secondary | ICD-10-CM | POA: Diagnosis not present

## 2022-03-22 DIAGNOSIS — N201 Calculus of ureter: Secondary | ICD-10-CM | POA: Diagnosis not present

## 2022-03-22 MED ORDER — CEPHALEXIN 500 MG PO CAPS
500.0000 mg | ORAL_CAPSULE | Freq: Three times a day (TID) | ORAL | 0 refills | Status: DC
  Filled 2022-03-22: qty 21, 7d supply, fill #0

## 2022-03-24 ENCOUNTER — Other Ambulatory Visit (HOSPITAL_BASED_OUTPATIENT_CLINIC_OR_DEPARTMENT_OTHER): Payer: Self-pay

## 2022-03-24 DIAGNOSIS — Z9189 Other specified personal risk factors, not elsewhere classified: Secondary | ICD-10-CM | POA: Diagnosis not present

## 2022-03-24 DIAGNOSIS — N200 Calculus of kidney: Secondary | ICD-10-CM | POA: Diagnosis not present

## 2022-03-24 DIAGNOSIS — G4733 Obstructive sleep apnea (adult) (pediatric): Secondary | ICD-10-CM | POA: Diagnosis not present

## 2022-03-24 DIAGNOSIS — Z658 Other specified problems related to psychosocial circumstances: Secondary | ICD-10-CM | POA: Diagnosis not present

## 2022-03-24 DIAGNOSIS — E118 Type 2 diabetes mellitus with unspecified complications: Secondary | ICD-10-CM | POA: Diagnosis not present

## 2022-03-25 ENCOUNTER — Other Ambulatory Visit (HOSPITAL_BASED_OUTPATIENT_CLINIC_OR_DEPARTMENT_OTHER): Payer: Self-pay

## 2022-03-30 ENCOUNTER — Other Ambulatory Visit: Payer: Self-pay | Admitting: Obstetrics and Gynecology

## 2022-03-30 DIAGNOSIS — N6311 Unspecified lump in the right breast, upper outer quadrant: Secondary | ICD-10-CM

## 2022-03-30 DIAGNOSIS — Z1272 Encounter for screening for malignant neoplasm of vagina: Secondary | ICD-10-CM | POA: Diagnosis not present

## 2022-03-30 DIAGNOSIS — Z1151 Encounter for screening for human papillomavirus (HPV): Secondary | ICD-10-CM | POA: Diagnosis not present

## 2022-03-30 DIAGNOSIS — Z01419 Encounter for gynecological examination (general) (routine) without abnormal findings: Secondary | ICD-10-CM | POA: Diagnosis not present

## 2022-03-30 DIAGNOSIS — Z6841 Body Mass Index (BMI) 40.0 and over, adult: Secondary | ICD-10-CM | POA: Diagnosis not present

## 2022-03-31 DIAGNOSIS — N6489 Other specified disorders of breast: Secondary | ICD-10-CM | POA: Diagnosis not present

## 2022-03-31 DIAGNOSIS — R928 Other abnormal and inconclusive findings on diagnostic imaging of breast: Secondary | ICD-10-CM | POA: Diagnosis not present

## 2022-03-31 DIAGNOSIS — N6315 Unspecified lump in the right breast, overlapping quadrants: Secondary | ICD-10-CM | POA: Diagnosis not present

## 2022-04-17 ENCOUNTER — Other Ambulatory Visit (HOSPITAL_BASED_OUTPATIENT_CLINIC_OR_DEPARTMENT_OTHER): Payer: Self-pay

## 2022-04-17 MED ORDER — MOUNJARO 12.5 MG/0.5ML ~~LOC~~ SOAJ
12.5000 mg | SUBCUTANEOUS | 2 refills | Status: DC
  Filled 2022-04-17 – 2022-04-19 (×2): qty 6, 84d supply, fill #0

## 2022-04-19 ENCOUNTER — Other Ambulatory Visit (HOSPITAL_BASED_OUTPATIENT_CLINIC_OR_DEPARTMENT_OTHER): Payer: Self-pay

## 2022-04-20 ENCOUNTER — Other Ambulatory Visit (HOSPITAL_BASED_OUTPATIENT_CLINIC_OR_DEPARTMENT_OTHER): Payer: Self-pay

## 2022-04-28 ENCOUNTER — Other Ambulatory Visit (HOSPITAL_BASED_OUTPATIENT_CLINIC_OR_DEPARTMENT_OTHER): Payer: Self-pay

## 2022-04-29 ENCOUNTER — Other Ambulatory Visit (HOSPITAL_BASED_OUTPATIENT_CLINIC_OR_DEPARTMENT_OTHER): Payer: Self-pay

## 2022-04-29 DIAGNOSIS — H01004 Unspecified blepharitis left upper eyelid: Secondary | ICD-10-CM | POA: Diagnosis not present

## 2022-04-29 DIAGNOSIS — R718 Other abnormality of red blood cells: Secondary | ICD-10-CM | POA: Diagnosis not present

## 2022-04-29 DIAGNOSIS — I152 Hypertension secondary to endocrine disorders: Secondary | ICD-10-CM | POA: Diagnosis not present

## 2022-04-29 DIAGNOSIS — E1169 Type 2 diabetes mellitus with other specified complication: Secondary | ICD-10-CM | POA: Diagnosis not present

## 2022-04-29 DIAGNOSIS — E559 Vitamin D deficiency, unspecified: Secondary | ICD-10-CM | POA: Diagnosis not present

## 2022-04-29 MED ORDER — MOUNJARO 15 MG/0.5ML ~~LOC~~ SOAJ
15.0000 mg | SUBCUTANEOUS | 2 refills | Status: DC
  Filled 2022-10-13 – 2022-10-28 (×2): qty 2, 28d supply, fill #0
  Filled 2022-11-22 – 2022-12-21 (×2): qty 2, 28d supply, fill #1
  Filled 2023-01-10 (×2): qty 2, 28d supply, fill #0

## 2022-04-29 MED ORDER — ERYTHROMYCIN 5 MG/GM OP OINT
TOPICAL_OINTMENT | OPHTHALMIC | 0 refills | Status: DC
  Filled 2022-04-29: qty 3.5, 10d supply, fill #0

## 2022-05-11 DIAGNOSIS — G4733 Obstructive sleep apnea (adult) (pediatric): Secondary | ICD-10-CM | POA: Diagnosis not present

## 2022-06-09 ENCOUNTER — Other Ambulatory Visit (HOSPITAL_BASED_OUTPATIENT_CLINIC_OR_DEPARTMENT_OTHER): Payer: Self-pay

## 2022-06-09 DIAGNOSIS — I152 Hypertension secondary to endocrine disorders: Secondary | ICD-10-CM | POA: Diagnosis not present

## 2022-06-09 DIAGNOSIS — D508 Other iron deficiency anemias: Secondary | ICD-10-CM | POA: Diagnosis not present

## 2022-06-09 DIAGNOSIS — G4733 Obstructive sleep apnea (adult) (pediatric): Secondary | ICD-10-CM | POA: Diagnosis not present

## 2022-06-09 DIAGNOSIS — E1169 Type 2 diabetes mellitus with other specified complication: Secondary | ICD-10-CM | POA: Diagnosis not present

## 2022-06-09 MED ORDER — VALSARTAN 40 MG PO TABS
40.0000 mg | ORAL_TABLET | Freq: Every day | ORAL | 0 refills | Status: DC
  Filled 2022-06-09: qty 30, 30d supply, fill #0

## 2022-06-09 MED ORDER — MOUNJARO 12.5 MG/0.5ML ~~LOC~~ SOAJ
12.5000 mg | SUBCUTANEOUS | 0 refills | Status: DC
  Filled 2022-06-09 – 2022-06-15 (×2): qty 2, 28d supply, fill #0

## 2022-06-09 MED ORDER — MOUNJARO 15 MG/0.5ML ~~LOC~~ SOAJ
15.0000 mg | SUBCUTANEOUS | 0 refills | Status: DC
  Filled 2022-06-10: qty 2, 28d supply, fill #0

## 2022-06-10 ENCOUNTER — Other Ambulatory Visit (HOSPITAL_COMMUNITY): Payer: Self-pay

## 2022-06-15 ENCOUNTER — Other Ambulatory Visit (HOSPITAL_BASED_OUTPATIENT_CLINIC_OR_DEPARTMENT_OTHER): Payer: Self-pay

## 2022-06-15 ENCOUNTER — Other Ambulatory Visit (HOSPITAL_COMMUNITY): Payer: Self-pay

## 2022-06-15 DIAGNOSIS — E611 Iron deficiency: Secondary | ICD-10-CM | POA: Diagnosis not present

## 2022-06-15 DIAGNOSIS — R718 Other abnormality of red blood cells: Secondary | ICD-10-CM | POA: Diagnosis not present

## 2022-06-15 DIAGNOSIS — D509 Iron deficiency anemia, unspecified: Secondary | ICD-10-CM | POA: Diagnosis not present

## 2022-06-15 DIAGNOSIS — D508 Other iron deficiency anemias: Secondary | ICD-10-CM | POA: Diagnosis not present

## 2022-06-15 MED ORDER — FERROUS GLUCONATE 324 (38 FE) MG PO TABS
ORAL_TABLET | ORAL | 1 refills | Status: DC
  Filled 2022-06-15: qty 100, 100d supply, fill #0

## 2022-06-17 DIAGNOSIS — G4733 Obstructive sleep apnea (adult) (pediatric): Secondary | ICD-10-CM | POA: Diagnosis not present

## 2022-06-17 DIAGNOSIS — Z0000 Encounter for general adult medical examination without abnormal findings: Secondary | ICD-10-CM | POA: Diagnosis not present

## 2022-06-17 DIAGNOSIS — E119 Type 2 diabetes mellitus without complications: Secondary | ICD-10-CM | POA: Diagnosis not present

## 2022-06-18 ENCOUNTER — Other Ambulatory Visit (HOSPITAL_BASED_OUTPATIENT_CLINIC_OR_DEPARTMENT_OTHER): Payer: Self-pay

## 2022-06-18 DIAGNOSIS — Z1231 Encounter for screening mammogram for malignant neoplasm of breast: Secondary | ICD-10-CM | POA: Diagnosis not present

## 2022-06-18 DIAGNOSIS — N3021 Other chronic cystitis with hematuria: Secondary | ICD-10-CM | POA: Diagnosis not present

## 2022-06-18 DIAGNOSIS — N201 Calculus of ureter: Secondary | ICD-10-CM | POA: Diagnosis not present

## 2022-06-18 MED ORDER — CIPROFLOXACIN HCL 500 MG PO TABS
500.0000 mg | ORAL_TABLET | Freq: Two times a day (BID) | ORAL | 0 refills | Status: DC
  Filled 2022-06-18: qty 14, 7d supply, fill #0

## 2022-06-22 ENCOUNTER — Ambulatory Visit: Payer: Self-pay

## 2022-06-22 ENCOUNTER — Other Ambulatory Visit: Payer: Self-pay

## 2022-06-22 ENCOUNTER — Other Ambulatory Visit (HOSPITAL_BASED_OUTPATIENT_CLINIC_OR_DEPARTMENT_OTHER): Payer: Self-pay

## 2022-06-22 DIAGNOSIS — U071 COVID-19: Secondary | ICD-10-CM | POA: Diagnosis not present

## 2022-06-22 MED ORDER — PAXLOVID (300/100) 20 X 150 MG & 10 X 100MG PO TBPK
ORAL_TABLET | Freq: Two times a day (BID) | ORAL | 0 refills | Status: DC
  Filled 2022-06-22: qty 30, 5d supply, fill #0

## 2022-07-13 DIAGNOSIS — D509 Iron deficiency anemia, unspecified: Secondary | ICD-10-CM | POA: Insufficient documentation

## 2022-07-13 DIAGNOSIS — E278 Other specified disorders of adrenal gland: Secondary | ICD-10-CM | POA: Diagnosis not present

## 2022-07-13 DIAGNOSIS — E119 Type 2 diabetes mellitus without complications: Secondary | ICD-10-CM | POA: Diagnosis not present

## 2022-07-13 DIAGNOSIS — Z0000 Encounter for general adult medical examination without abnormal findings: Secondary | ICD-10-CM | POA: Diagnosis not present

## 2022-07-13 DIAGNOSIS — I280 Arteriovenous fistula of pulmonary vessels: Secondary | ICD-10-CM | POA: Diagnosis not present

## 2022-07-13 DIAGNOSIS — I10 Essential (primary) hypertension: Secondary | ICD-10-CM | POA: Diagnosis not present

## 2022-07-14 ENCOUNTER — Other Ambulatory Visit (HOSPITAL_BASED_OUTPATIENT_CLINIC_OR_DEPARTMENT_OTHER): Payer: Self-pay

## 2022-07-14 ENCOUNTER — Other Ambulatory Visit (HOSPITAL_COMMUNITY): Payer: Self-pay

## 2022-07-14 DIAGNOSIS — G4733 Obstructive sleep apnea (adult) (pediatric): Secondary | ICD-10-CM | POA: Diagnosis not present

## 2022-07-14 DIAGNOSIS — I152 Hypertension secondary to endocrine disorders: Secondary | ICD-10-CM | POA: Diagnosis not present

## 2022-07-14 DIAGNOSIS — M5431 Sciatica, right side: Secondary | ICD-10-CM | POA: Diagnosis not present

## 2022-07-14 DIAGNOSIS — E1169 Type 2 diabetes mellitus with other specified complication: Secondary | ICD-10-CM | POA: Diagnosis not present

## 2022-07-14 MED ORDER — MOUNJARO 15 MG/0.5ML ~~LOC~~ SOAJ
15.0000 mg | SUBCUTANEOUS | 3 refills | Status: DC
  Filled 2022-07-14: qty 6, 84d supply, fill #0
  Filled 2022-08-25: qty 2, 28d supply, fill #0
  Filled 2022-08-25: qty 6, 84d supply, fill #0
  Filled 2022-08-25: qty 2, 28d supply, fill #0
  Filled 2022-08-25: qty 6, 84d supply, fill #0
  Filled 2022-09-21: qty 2, 28d supply, fill #1

## 2022-07-14 MED ORDER — CYCLOBENZAPRINE HCL 5 MG PO TABS
5.0000 mg | ORAL_TABLET | Freq: Every evening | ORAL | 0 refills | Status: DC | PRN
  Filled 2022-07-14: qty 20, 20d supply, fill #0

## 2022-07-14 MED ORDER — VALSARTAN-HYDROCHLOROTHIAZIDE 80-12.5 MG PO TABS
0.5000 | ORAL_TABLET | Freq: Every day | ORAL | 1 refills | Status: DC
  Filled 2022-07-14: qty 45, 90d supply, fill #0

## 2022-07-15 ENCOUNTER — Other Ambulatory Visit (HOSPITAL_BASED_OUTPATIENT_CLINIC_OR_DEPARTMENT_OTHER): Payer: Self-pay

## 2022-07-28 ENCOUNTER — Other Ambulatory Visit (HOSPITAL_BASED_OUTPATIENT_CLINIC_OR_DEPARTMENT_OTHER): Payer: Self-pay

## 2022-08-05 ENCOUNTER — Ambulatory Visit: Payer: BC Managed Care – PPO | Admitting: Family Medicine

## 2022-08-06 ENCOUNTER — Other Ambulatory Visit: Payer: Self-pay

## 2022-08-06 ENCOUNTER — Emergency Department (HOSPITAL_BASED_OUTPATIENT_CLINIC_OR_DEPARTMENT_OTHER)
Admission: EM | Admit: 2022-08-06 | Discharge: 2022-08-06 | Disposition: A | Discharge status: Home / Self Care | Payer: BC Managed Care – PPO | Attending: Emergency Medicine | Admitting: Emergency Medicine

## 2022-08-06 ENCOUNTER — Emergency Department (HOSPITAL_BASED_OUTPATIENT_CLINIC_OR_DEPARTMENT_OTHER): Payer: BC Managed Care – PPO

## 2022-08-06 ENCOUNTER — Encounter (HOSPITAL_BASED_OUTPATIENT_CLINIC_OR_DEPARTMENT_OTHER): Payer: Self-pay | Admitting: Emergency Medicine

## 2022-08-06 DIAGNOSIS — R112 Nausea with vomiting, unspecified: Secondary | ICD-10-CM | POA: Diagnosis not present

## 2022-08-06 DIAGNOSIS — M5441 Lumbago with sciatica, right side: Secondary | ICD-10-CM | POA: Insufficient documentation

## 2022-08-06 DIAGNOSIS — I10 Essential (primary) hypertension: Secondary | ICD-10-CM | POA: Insufficient documentation

## 2022-08-06 DIAGNOSIS — Z79899 Other long term (current) drug therapy: Secondary | ICD-10-CM | POA: Diagnosis not present

## 2022-08-06 DIAGNOSIS — R109 Unspecified abdominal pain: Secondary | ICD-10-CM | POA: Diagnosis not present

## 2022-08-06 DIAGNOSIS — Z87442 Personal history of urinary calculi: Secondary | ICD-10-CM | POA: Diagnosis not present

## 2022-08-06 DIAGNOSIS — M545 Low back pain, unspecified: Secondary | ICD-10-CM | POA: Diagnosis not present

## 2022-08-06 DIAGNOSIS — D3502 Benign neoplasm of left adrenal gland: Secondary | ICD-10-CM | POA: Diagnosis not present

## 2022-08-06 DIAGNOSIS — K5730 Diverticulosis of large intestine without perforation or abscess without bleeding: Secondary | ICD-10-CM | POA: Diagnosis not present

## 2022-08-06 DIAGNOSIS — N281 Cyst of kidney, acquired: Secondary | ICD-10-CM | POA: Diagnosis not present

## 2022-08-06 LAB — CBC WITH DIFFERENTIAL/PLATELET
Abs Immature Granulocytes: 0.04 10*3/uL (ref 0.00–0.07)
Basophils Absolute: 0.1 10*3/uL (ref 0.0–0.1)
Basophils Relative: 0 %
Eosinophils Absolute: 0.1 10*3/uL (ref 0.0–0.5)
Eosinophils Relative: 1 %
HCT: 37.1 % (ref 36.0–46.0)
Hemoglobin: 12 g/dL (ref 12.0–15.0)
Immature Granulocytes: 0 %
Lymphocytes Relative: 13 %
Lymphs Abs: 1.5 10*3/uL (ref 0.7–4.0)
MCH: 25.4 pg — ABNORMAL LOW (ref 26.0–34.0)
MCHC: 32.3 g/dL (ref 30.0–36.0)
MCV: 78.6 fL — ABNORMAL LOW (ref 80.0–100.0)
Monocytes Absolute: 0.6 10*3/uL (ref 0.1–1.0)
Monocytes Relative: 5 %
Neutro Abs: 9 10*3/uL — ABNORMAL HIGH (ref 1.7–7.7)
Neutrophils Relative %: 81 %
Platelets: 208 10*3/uL (ref 150–400)
RBC: 4.72 MIL/uL (ref 3.87–5.11)
RDW: 14.8 % (ref 11.5–15.5)
WBC: 11.3 10*3/uL — ABNORMAL HIGH (ref 4.0–10.5)
nRBC: 0 % (ref 0.0–0.2)

## 2022-08-06 LAB — URINALYSIS, ROUTINE W REFLEX MICROSCOPIC
Bilirubin Urine: NEGATIVE
Glucose, UA: NEGATIVE mg/dL
Hgb urine dipstick: NEGATIVE
Ketones, ur: NEGATIVE mg/dL
Leukocytes,Ua: NEGATIVE
Nitrite: NEGATIVE
Protein, ur: NEGATIVE mg/dL
Specific Gravity, Urine: 1.02 (ref 1.005–1.030)
pH: 7 (ref 5.0–8.0)

## 2022-08-06 LAB — BASIC METABOLIC PANEL
Anion gap: 4 — ABNORMAL LOW (ref 5–15)
BUN: 19 mg/dL (ref 6–20)
CO2: 26 mmol/L (ref 22–32)
Calcium: 8.9 mg/dL (ref 8.9–10.3)
Chloride: 106 mmol/L (ref 98–111)
Creatinine, Ser: 0.66 mg/dL (ref 0.44–1.00)
GFR, Estimated: >60 mL/min (ref >60)
Glucose, Bld: 100 mg/dL — ABNORMAL HIGH (ref 70–99)
Potassium: 3.7 mmol/L (ref 3.5–5.1)
Sodium: 136 mmol/L (ref 135–145)

## 2022-08-06 MED ORDER — ONDANSETRON 4 MG PO TBDP
8.0000 mg | ORAL_TABLET | Freq: Once | ORAL | Status: AC
  Administered 2022-08-06: 8 mg via ORAL
  Filled 2022-08-06: qty 2

## 2022-08-06 MED ORDER — CYCLOBENZAPRINE HCL 10 MG PO TABS: 10.0000 mg | ORAL_TABLET | Freq: Once | ORAL | Status: DC

## 2022-08-06 MED ORDER — OXYCODONE-ACETAMINOPHEN 5-325 MG PO TABS: 1.0000 | ORAL_TABLET | Freq: Once | ORAL | Status: DC

## 2022-08-06 MED ORDER — METAXALONE 800 MG PO TABS: 800.0000 mg | ORAL_TABLET | Freq: Three times a day (TID) | ORAL | 0 refills | Status: DC

## 2022-08-06 MED ORDER — MELOXICAM 15 MG PO TABS: 15.0000 mg | ORAL_TABLET | Freq: Every day | ORAL | 0 refills | Status: DC

## 2022-08-06 MED ORDER — KETOROLAC TROMETHAMINE 60 MG/2ML IM SOLN
30.0000 mg | Freq: Once | INTRAMUSCULAR | Status: AC
  Administered 2022-08-06: 30 mg via INTRAMUSCULAR
  Filled 2022-08-06: qty 2

## 2022-08-06 MED ORDER — MORPHINE SULFATE (PF) 2 MG/ML IV SOLN
2.0000 mg | Freq: Once | INTRAVENOUS | Status: AC
  Administered 2022-08-06: 2 mg via INTRAVENOUS
  Filled 2022-08-06: qty 1

## 2022-08-06 MED ORDER — HYDROCODONE-ACETAMINOPHEN 5-325 MG PO TABS
1.0000 | ORAL_TABLET | Freq: Once | ORAL | Status: AC
  Administered 2022-08-06: 1 via ORAL
  Filled 2022-08-06: qty 1

## 2022-08-06 NOTE — ED Triage Notes (Signed)
Lower back pain radiating to right leg , had kidney stone in past . Dysuria

## 2022-08-06 NOTE — ED Provider Notes (Signed)
[place] [facility]GENC[facility] AT [facility] Provider Note   CSN: [id number] Arrival date & time: 08/06/22  1305     History  Chief Complaint  Patient presents with   Back Pain    Diana Schultz is a 57 y.o. female.   Back Pain   57 year old female presents emergency department complaints of right-sided low back pain.  Patient reports history symptoms are getting approximately 2 weeks ago.  States that symptoms been intermittent since onset.  Has seen primary care outpatient who prescribed Flexeril which patient states has helped some.  She is also been taking over-the-counter ibuprofen which is also helped some.  She reports pain radiating down posterior right leg as well as in right inguinal region.  Says she has a history of kidney stones and states this pain feels similar.  Denies any known injury/trauma.  Reports some feelings of nausea with 2 episodes of emesis today.  Denies fever, chills, night sweats, chest pain, shortness of breath, anterior abdominal pain, urinary/vaginal symptoms, change in bowel habits.  Denies saddle anesthesia, bowel/bladder dysfunction, weakness/sensory deficits in lower extremities, fever, history of IV drug use  Past medical history significant for hypertension, prediabetes, osteoarthritis, obesity  Home Medications Prior to Admission medications   Medication Sig Start Date End Date Taking? Authorizing Provider  meloxicam (MOBIC) 15 MG tablet Take 1 tablet (15 mg total) by mouth daily. 08/06/22  Yes Dion Saucier A, PA  metaxalone (SKELAXIN) 800 MG tablet Take 1 tablet (800 mg total) by mouth 3 (three) times daily. 08/06/22  Yes Dion Saucier A, PA  cephALEXin (KEFLEX) 500 MG capsule Take 1 capsule (500 mg total) by mouth 3 (three) times daily. 03/22/22     ciprofloxacin (CIPRO) 500 MG tablet Take 1 tablet by mouth twice daily. 06/18/22     cyclobenzaprine (FLEXERIL) 5 MG tablet Take 1 tablet (5 mg total) by mouth at bedtime as needed for  muscle spasms. 07/14/22     erythromycin ophthalmic ointment Apply to lower eyelid of affected eye four times daily for 10 days. 04/29/22     ferrous gluconate (FERGON) 324 MG tablet Take one tablet (324 mg dose) by mouth daily. 06/15/22     nirmatrelvir & ritonavir (PAXLOVID, 300/100,) 20 x 150 MG & 10 x 100MG TBPK Take as directed . 06/22/22     ondansetron (ZOFRAN-ODT) 4 MG disintegrating tablet Take 1 tablet (4 mg total) by mouth every 8 (eight) hours as needed for nausea or vomiting. 03/18/22   Gareth Morgan, MD  oxyCODONE (ROXICODONE) 5 MG immediate release tablet Take 1 tablet (5 mg total) by mouth every 4 (four) hours as needed for severe pain. 03/18/22   Gareth Morgan, MD  spironolactone (ALDACTONE) 50 MG tablet Take 1 - 2 tablet by mouth Once a day 90 days 12/07/21     tirzepatide [facility]) 12.5 MG/0.5ML Pen Inject 12.5 mg 12/07/21   [provider]  tirzepatide Darcel Bayley) 12.5 MG/0.5ML Pen Inject 12.5 mg Subcutaneous once a week 30 days 01/26/22     tirzepatide (MOUNJARO) 12.5 MG/0.5ML Pen Inject 12.5 mg Subcutaneous once a week 30 days 02/28/22     tirzepatide (MOUNJARO) 12.5 MG/0.5ML Pen Inject 12.5 mg into the skin once a week. 04/17/22     tirzepatide (MOUNJARO) 12.5 MG/0.5ML Pen Inject 12.5 mg into the skin once a week. 06/09/22     tirzepatide [facility]) 15 MG/0.5ML Pen Inject 66m 12/07/21   [provider]  tirzepatide (Darcel Bayley 15 MG/0.5ML Pen Inject 183mSubcutaneous once  a week 30 days 01/26/22     tirzepatide (MOUNJARO) 15 MG/0.5ML Pen Inject 15 mg into the skin once a week. 02/17/22     tirzepatide (MOUNJARO) 15 MG/0.5ML Pen Inject 15 mg into the skin once a week. 02/24/22     tirzepatide (MOUNJARO) 15 MG/0.5ML Pen Inject 82m under the skin once a week. 04/29/22     tirzepatide (MOUNJARO) 15 MG/0.5ML Pen Inject 15 mg into the skin once a week. 06/09/22     tirzepatide (MOUNJARO) 15 MG/0.5ML Pen Inject 122m(0.40m50munder the skin once weekly. 07/14/22     valsartan  (DIOVAN) 40 MG tablet Take 1 tablet (40 mg total) by mouth daily. 06/09/22     valsartan-hydrochlorothiazide (DIOVAN-HCT) 80-12.5 MG tablet Take 1/2 tablet by mouth once daily. 07/14/22     Vitamin D, Ergocalciferol, 50000 units CAPS 1 capsule Orally Once a Week 90 days 01/26/22         Allergies    Amlodipine, Metoprolol, Benicar [olmesartan], Hydrochlorothiazide, Lactose intolerance (gi), Lisinopril, Nebivolol, and Semaglutide(0.25 or 0.40mg4ms)    Review of Systems   Review of Systems  Musculoskeletal:  Positive for back pain.  All other systems reviewed and are negative.   Physical Exam Updated Vital Signs BP 135/83   Pulse (!) 102   Temp 97.8 F (36.6 C)   Resp (!) 24   Wt 110.9 kg   LMP 12/01/2013 Comment: hx ablation 03/23/2013  SpO2 100%   BMI 42.63 kg/m  Physical Exam Vitals and nursing note reviewed.  Constitutional:      General: She is not in acute distress.    Appearance: She is well-developed.  HENT:     Head: Normocephalic and atraumatic.  Eyes:     Conjunctiva/sclera: Conjunctivae normal.  Cardiovascular:     Rate and Rhythm: Normal rate and regular rhythm.     Heart sounds: No murmur heard. Pulmonary:     Effort: Pulmonary effort is normal. No respiratory distress.     Breath sounds: Normal breath sounds.  Abdominal:     Palpations: Abdomen is soft.     Tenderness: There is no abdominal tenderness. There is right CVA tenderness. There is no left CVA tenderness.  Musculoskeletal:        General: No swelling.     Cervical back: Neck supple.     Comments: No midline tenderness of verbal, thoracic, lumbar spine with no obvious step-off or deformity noted.  Paraspinal tenderness noted in the right lumbar region.  Difficult to assess whether or not there is right CVA tenderness given overlying muscular tenderness.  Muscle strength 5 out of 5 knee flexion and extension.  No sensory deficits along major nerve distributions of lower extremities.  DTR symmetric and  full bilaterally.  Pedal pulses 2+ bilaterally.  No overlying skin abnormalities appreciated.  Straight leg raise positive on the right.  Skin:    General: Skin is warm and dry.     Capillary Refill: Capillary refill takes less than 2 seconds.  Neurological:     Mental Status: She is alert.  Psychiatric:        Mood and Affect: Mood normal.     ED Results / Procedures / Treatments   Labs (all labs ordered are listed, but only abnormal results are displayed) Labs Reviewed  BASIC METABOLIC PANEL - Abnormal; Notable for the following components:      Result Value   Glucose, Bld 100 (*)    Anion gap 4 (*)    All  other components within normal limits  CBC WITH DIFFERENTIAL/PLATELET - Abnormal; Notable for the following components:   WBC 11.3 (*)    MCV 78.6 (*)    MCH 25.4 (*)    Neutro Abs 9.0 (*)    All other components within normal limits  URINALYSIS, ROUTINE W REFLEX MICROSCOPIC    EKG None  Radiology CT Renal Stone Study  Result Date: 08/06/2022 CLINICAL DATA:  Lower abdominal and flank pain radiating to the right leg, history of kidney stones EXAM: CT ABDOMEN AND PELVIS WITHOUT CONTRAST TECHNIQUE: Multidetector CT imaging of the abdomen and pelvis was performed following the standard protocol without IV contrast. RADIATION DOSE REDUCTION: This exam was performed according to the departmental dose-optimization program which includes automated exposure control, adjustment of the mA and/or kV according to patient size and/or use of iterative reconstruction technique. COMPARISON:  03/18/2022 FINDINGS: Lower chest: No acute abnormality. Hepatobiliary: No solid liver abnormality is seen. No gallstones, gallbladder wall thickening, or biliary dilatation. Pancreas: Unremarkable. No pancreatic ductal dilatation or surrounding inflammatory changes. Spleen: Normal in size without significant abnormality. Adrenals/Urinary Tract: Definitively benign, fat containing small left adrenal adenoma,  for which no further follow-up or characterization is required (series 2, image 25). Multiple bilateral parapelvic renal cysts, benign, for which no further follow-up or characterization is required. Kidneys are otherwise normal, without renal calculi, solid lesion, or hydronephrosis. Bladder is unremarkable. Stomach/Bowel: Stomach is within normal limits. Appendix is surgically absent. No evidence of bowel wall thickening, distention, or inflammatory changes. Occasional sigmoid diverticula. Vascular/Lymphatic: No significant vascular findings are present. No enlarged abdominal or pelvic lymph nodes. Reproductive: Status post hysterectomy. Other: No abdominal wall hernia or abnormality. No ascites. Musculoskeletal: No acute or significant osseous findings. IMPRESSION: 1. No acute noncontrast CT findings of the abdomen or pelvis to explain abdominal pain. No urinary tract calculi or hydronephrosis. 2. Occasional sigmoid diverticula without evidence of acute diverticulitis. 3. Status post hysterectomy and appendectomy. Electronically Signed   By: Delanna Ahmadi M.D.   On: 08/06/2022 14:46    Procedures Procedures    Medications Ordered in ED Medications  oxyCODONE-acetaminophen (PERCOCET/ROXICET) 5-325 MG per tablet 1 tablet (has no administration in time range)  HYDROcodone-acetaminophen (NORCO/VICODIN) 5-325 MG per tablet 1 tablet (1 tablet Oral Given 08/06/22 1415)  ondansetron (ZOFRAN-ODT) disintegrating tablet 8 mg (8 mg Oral Given 08/06/22 1402)  ketorolac (TORADOL) injection 30 mg (30 mg Intramuscular Given 08/06/22 1414)    ED Course/ Medical Decision Making/ A&P                             Medical Decision Making Amount and/or Complexity of Data Reviewed Labs: ordered. Radiology: ordered.  Risk Prescription drug management.   This patient presents to the ED for concern of back pain, this involves an extensive number of treatment options, and is a complaint that carries with it a high  risk of complications and morbidity.  The differential diagnosis includes pyelonephritis, nephrolithiasis, muscular strain/sprain, fracture, dislocation, cauda equina, spinal epidural abscess, AAA, aortic dissection   Co morbidities that complicate the patient evaluation  See HPI   Additional history obtained:  Additional history obtained from EMR External records from outside source obtained and reviewed including hospital records   Lab Tests:  I Ordered, and personally interpreted labs.  The pertinent results include: Mild leukocytosis 11.3.  No evidence of anemia.  Platelets within range.  No electrolyte abnormalities appreciated.  No renal dysfunction.  UA  without abnormality.   Imaging Studies ordered:  I ordered imaging studies including CT renal stone study I independently visualized and interpreted imaging which showed no acute abnormalities.  Sigmoid diverticula without acute diverticulitis. I agree with the radiologist interpretation  Cardiac Monitoring: / EKG:  The patient was maintained on a cardiac monitor.  I personally viewed and interpreted the cardiac monitored which showed an underlying rhythm of: Sinus rhythm   Consultations Obtained:  N/a   Problem List / ED Course / Critical interventions / Medication management  Low back pain with right sciatica I ordered medication including Norco, Percocet for pain.  Toradol for pain.  Zofran for nausea   Reevaluation of the patient after these medicines showed that the patient improved I have reviewed the patients home medicines and have made adjustments as needed   Social Determinants of Health:  Denies tobacco, illicit use.   Test / Admission - Considered:  Right low back pain with sciatica Vitals signs significant for initial tachycardia and tachypnea with a heart rate of 102 and respiratory rate of 24 which responded to time elapsed and medicines administered while in the emergency department.. Otherwise  within normal range and stable throughout visit. Laboratory/imaging studies significant for: See above Patient symptoms most consistent with right-sided low back pain with sciatica.  Patient responded well to medicines administered while in the emergency department.  Recommended continue outpatient medicines of the same with switching NSAID therapy to meloxicam and switching muscle laxer therapy to Skelaxin.  Patient diabetic; consider prednisone but due to patient's diabetes, we will avoid.  Doubt cauda equina/spinal epidural abscess given lack of red flag signs.  Doubt aortic dissection given lack of hypertension, pain consistent with aortic dissection, symmetric strength, sensation, pulses bilateral lower extremities.  Doubt intra-abdominal pathology.  Doubt acute fracture/dislocation given lack of traumatic mechanism.  Symptoms seems most consistent with sciatica type pain.  Will treat as mentioned before and will give information for outpatient follow-up with spinal specialist.  Treatment plan discussed at length with patient and she acknowledged understanding was agreeable to said plan. Worrisome signs and symptoms were discussed with the patient, and the patient acknowledged understanding to return to the ED if noticed. Patient was stable upon discharge.          Final Clinical Impression(s) / ED Diagnoses Final diagnoses:  Acute right-sided low back pain with right-sided sciatica    Rx / DC Orders ED Discharge Orders          Ordered    meloxicam (MOBIC) 15 MG tablet  Daily        08/06/22 1518    metaxalone (SKELAXIN) 800 MG tablet  3 times daily        08/06/22 1518              Wilnette Kales, [place] 08/06/22 1538    Elgie Congo, MD 08/07/22 1746

## 2022-08-06 NOTE — ED Notes (Signed)
Rt back pain x 2 days denies any injury or lifting anything heavy, hurts to move and turn she states

## 2022-08-06 NOTE — Discharge Instructions (Addendum)
The workup today was overall reassuring.  As discussed, CT imaging was negative for any kidney stone.  Symptoms was likely secondary to sciatica type pain.  As discussed, we will change her NSAID to meloxicam; this is in the same family as Motrin/ibuprofen so please do not take the medicines concurrently.  Will also switch muscle laxer to metaxalone which is in the same family as cyclobenzaprine so please do not take these medicines together.  Recommend follow-up with spinal specialist outpatient for reevaluation of your symptoms.  Please do not hesitate to return to emergency department for worrisome signs and symptoms we discussed become apparent.

## 2022-08-20 DIAGNOSIS — E118 Type 2 diabetes mellitus with unspecified complications: Secondary | ICD-10-CM | POA: Insufficient documentation

## 2022-08-20 DIAGNOSIS — E65 Localized adiposity: Secondary | ICD-10-CM | POA: Insufficient documentation

## 2022-08-20 DIAGNOSIS — G4700 Insomnia, unspecified: Secondary | ICD-10-CM | POA: Insufficient documentation

## 2022-08-20 DIAGNOSIS — F4310 Post-traumatic stress disorder, unspecified: Secondary | ICD-10-CM | POA: Insufficient documentation

## 2022-08-20 DIAGNOSIS — G4733 Obstructive sleep apnea (adult) (pediatric): Secondary | ICD-10-CM | POA: Insufficient documentation

## 2022-08-20 DIAGNOSIS — K219 Gastro-esophageal reflux disease without esophagitis: Secondary | ICD-10-CM | POA: Insufficient documentation

## 2022-08-20 DIAGNOSIS — N200 Calculus of kidney: Secondary | ICD-10-CM | POA: Insufficient documentation

## 2022-08-20 DIAGNOSIS — R0989 Other specified symptoms and signs involving the circulatory and respiratory systems: Secondary | ICD-10-CM | POA: Insufficient documentation

## 2022-08-25 ENCOUNTER — Other Ambulatory Visit (HOSPITAL_COMMUNITY): Payer: Self-pay

## 2022-08-25 ENCOUNTER — Other Ambulatory Visit (HOSPITAL_BASED_OUTPATIENT_CLINIC_OR_DEPARTMENT_OTHER): Payer: Self-pay

## 2022-08-25 DIAGNOSIS — R29898 Other symptoms and signs involving the musculoskeletal system: Secondary | ICD-10-CM | POA: Diagnosis not present

## 2022-08-25 DIAGNOSIS — Z7409 Other reduced mobility: Secondary | ICD-10-CM | POA: Diagnosis not present

## 2022-08-25 DIAGNOSIS — M25551 Pain in right hip: Secondary | ICD-10-CM | POA: Diagnosis not present

## 2022-08-25 DIAGNOSIS — M5441 Lumbago with sciatica, right side: Secondary | ICD-10-CM | POA: Diagnosis not present

## 2022-08-26 ENCOUNTER — Other Ambulatory Visit (HOSPITAL_COMMUNITY): Payer: Self-pay

## 2022-09-15 ENCOUNTER — Other Ambulatory Visit (HOSPITAL_COMMUNITY): Payer: Self-pay

## 2022-09-16 DIAGNOSIS — M5416 Radiculopathy, lumbar region: Secondary | ICD-10-CM | POA: Diagnosis not present

## 2022-09-16 DIAGNOSIS — E1169 Type 2 diabetes mellitus with other specified complication: Secondary | ICD-10-CM | POA: Diagnosis not present

## 2022-09-16 DIAGNOSIS — D508 Other iron deficiency anemias: Secondary | ICD-10-CM | POA: Diagnosis not present

## 2022-09-17 DIAGNOSIS — G4733 Obstructive sleep apnea (adult) (pediatric): Secondary | ICD-10-CM | POA: Diagnosis not present

## 2022-09-21 ENCOUNTER — Other Ambulatory Visit: Payer: Self-pay

## 2022-09-24 DIAGNOSIS — I83891 Varicose veins of right lower extremities with other complications: Secondary | ICD-10-CM | POA: Diagnosis not present

## 2022-09-24 DIAGNOSIS — M79661 Pain in right lower leg: Secondary | ICD-10-CM | POA: Diagnosis not present

## 2022-09-24 DIAGNOSIS — I872 Venous insufficiency (chronic) (peripheral): Secondary | ICD-10-CM | POA: Diagnosis not present

## 2022-09-24 DIAGNOSIS — R600 Localized edema: Secondary | ICD-10-CM | POA: Diagnosis not present

## 2022-09-24 DIAGNOSIS — M79604 Pain in right leg: Secondary | ICD-10-CM | POA: Diagnosis not present

## 2022-09-28 ENCOUNTER — Other Ambulatory Visit (HOSPITAL_COMMUNITY): Payer: Self-pay

## 2022-09-28 DIAGNOSIS — D509 Iron deficiency anemia, unspecified: Secondary | ICD-10-CM | POA: Diagnosis not present

## 2022-09-29 NOTE — Progress Notes (Addendum)
Diana Schultz D.[facility] [address] [place] [address] Phone: [phone]   Assessment and Plan:     1. Acute right-sided low back pain with right-sided sciatica 2. Right leg swelling  -Subacute, worsening, initial sports medicine visit - Unclear etiology of patient's ongoing right-sided low back pain, right-sided gluteal pain, radiating into right upper thigh, with right thigh swelling.  Patient states she was evaluated at [facility] for DVT and was negative for DVT, though did find venous reflux which may be the underlying cause for patient's thigh swelling.  Patient's pain does coincide with swelling as increased swelling leads to increased pain, so I recommend following up with venous specialist to discuss further workup and treatment plan - Patient does not appear to have musculoskeletal etiology of pain as well.  Difficult to say if it is being triggered by venous symptoms or if this is a separate issue.  Mild degenerative changes seen on x-ray and CT and lower lumbar spine, most significant at L5-S1 based on x-ray today. - Start meloxicam 15 mg daily x2 weeks.  If still having pain after 2 weeks, complete 3rd-week of meloxicam. May use remaining meloxicam as needed once daily for pain control.  Do not to use additional NSAIDs while taking meloxicam.  May use Tylenol [phone] mg 2 to 3 times a day for breakthrough pain.  Patient already had prescription, so no refill provided today - Continue gabapentin 300 mg nightly as needed for pain and radicular symptoms.  Patient does have some drowsiness with 300 mg tablets, so could consider decreasing to 100 mg tablets in the future. - Start HEP for low back - Patient elected for IM injection of methylprednisone 80 mg/Toradol 60 mg.  Injection given in clinic today and tolerated well.  Pertinent previous records reviewed include vascular summary note 09/24/22   Follow Up: 3 weeks for  reevaluation.  If no improvement or worsening of symptoms, could consider MRI of lumbar spine versus right hip, versus physical therapy   Subjective:   I, [name], CMA, am serving as a scribe for Doctor Richardean Sale  Chief Complaint: low back pain   HPI:   09/30/2022 Patient is a 57 year old female complaining of low back pain. Patient c/o lower back and sacral pain radiating into the right gluteal region and anterior thigh. Sx x 2 months, had eval at [facility]. Received steroid injection with some relief, then sx came back. Had CT to r/o renal calculi. Notes significant swelling in the right thigh. Has dx of vein reflux in the R LE.Also c/o right groin pain. Sx worse when turning, side-lying. Has tried Gabapentin, topical analgesic, Tylenol, muscle relaxer, menthol rub, heat ice;  nothing provides relief. Gabapentin causing drowsiness.   No recent imaging of the low back or hips, XR done today.    Relevant Historical Information: Hypertension, history of DVT at 57 years old thought to be related to birth control with no symptoms since that time after discontinuing birth control.  Additional pertinent review of systems negative.   Current Outpatient Medications:    cyclobenzaprine (FLEXERIL) 5 MG tablet, Take 1 tablet (5 mg total) by mouth at bedtime as needed for muscle spasms., Disp: 20 tablet, Rfl: 0   erythromycin ophthalmic ointment, Apply to lower eyelid of affected eye four times daily for 10 days., Disp: 3.5 g, Rfl: 0   ferrous gluconate (FERGON) 324 MG tablet, Take one tablet (324 mg dose) by mouth  daily., Disp: 100 tablet, Rfl: 1   meloxicam (MOBIC) 15 MG tablet, Take 1 tablet (15 mg total) by mouth daily., Disp: 30 tablet, Rfl: 0   metaxalone (SKELAXIN) 800 MG tablet, Take 1 tablet (800 mg total) by mouth 3 (three) times daily., Disp: 21 tablet, Rfl: 0   nirmatrelvir & ritonavir (PAXLOVID, 300/100,) 20 x 150 MG & 10 x 100MG  TBPK, Take as directed ., Disp: 30 tablet,  Rfl: 0   oxyCODONE (ROXICODONE) 5 MG immediate release tablet, Take 1 tablet (5 mg total) by mouth every 4 (four) hours as needed for severe pain., Disp: 20 tablet, Rfl: 0   spironolactone (ALDACTONE) 50 MG tablet, Take 1 - 2 tablet by mouth Once a day 90 days, Disp: 180 tablet, Rfl: 0   tirzepatide (MOUNJARO) 12.5 MG/0.5ML Pen, Inject 12.5 mg, Disp: , Rfl:    tirzepatide (MOUNJARO) 12.5 MG/0.5ML Pen, Inject 12.5 mg Subcutaneous once a week 30 days, Disp: 2 mL, Rfl: 2   tirzepatide (MOUNJARO) 12.5 MG/0.5ML Pen, Inject 12.5 mg Subcutaneous once a week 30 days, Disp: 2 mL, Rfl: 2   tirzepatide (MOUNJARO) 12.5 MG/0.5ML Pen, Inject 12.5 mg into the skin once a week., Disp: 2 mL, Rfl: 2   tirzepatide (MOUNJARO) 12.5 MG/0.5ML Pen, Inject 12.5 mg into the skin once a week., Disp: 6 mL, Rfl: 0   tirzepatide (MOUNJARO) 15 MG/0.5ML Pen, Inject 15mg , Disp: , Rfl:    tirzepatide (MOUNJARO) 15 MG/0.5ML Pen, Inject 15mg  Subcutaneous once a week 30 days, Disp: 2 mL, Rfl: 2   tirzepatide (MOUNJARO) 15 MG/0.5ML Pen, Inject 15 mg into the skin once a week., Disp: 2 mL, Rfl: 2   tirzepatide (MOUNJARO) 15 MG/0.5ML Pen, Inject 15 mg into the skin once a week., Disp: 2 mL, Rfl: 2   tirzepatide (MOUNJARO) 15 MG/0.5ML Pen, Inject 15mg  under the skin once a week., Disp: 2 mL, Rfl: 2   tirzepatide (MOUNJARO) 15 MG/0.5ML Pen, Inject 15 mg into the skin once a week., Disp: 6 mL, Rfl: 0   tirzepatide (MOUNJARO) 15 MG/0.5ML Pen, Inject 15mg  (0.17mL) under the skin once weekly., Disp: 6 mL, Rfl: 3   valsartan (DIOVAN) 40 MG tablet, Take 1 tablet (40 mg total) by mouth daily., Disp: 30 tablet, Rfl: 0   valsartan-hydrochlorothiazide (DIOVAN-HCT) 80-12.5 MG tablet, Take 1/2 tablet by mouth once daily., Disp: 45 tablet, Rfl: 1   Vitamin D, Ergocalciferol, 50000 units CAPS, 1 capsule Orally Once a Week 90 days, Disp: 12 capsule, Rfl: 0   cephALEXin (KEFLEX) 500 MG capsule, Take 1 capsule (500 mg total) by mouth 3 (three) times daily.  (Patient not taking: Reported on 09/30/2022), Disp: 21 capsule, Rfl: 0   ciprofloxacin (CIPRO) 500 MG tablet, Take 1 tablet by mouth twice daily. (Patient not taking: Reported on 09/30/2022), Disp: 14 tablet, Rfl: 0   ondansetron (ZOFRAN-ODT) 4 MG disintegrating tablet, Take 1 tablet (4 mg total) by mouth every 8 (eight) hours as needed for nausea or vomiting. (Patient not taking: Reported on 09/30/2022), Disp: 20 tablet, Rfl: 0   Objective:     Vitals:   09/30/22 0951  BP: 138/84  Pulse: 63  SpO2: 99%  Weight: 247 lb 12.8 oz (112.4 kg)  Height: 5' 3.5" (1.613 m)      Body mass index is 43.21 kg/m.    Physical Exam:    Gen: Appears well, nad, nontoxic and pleasant Psych: Alert and oriented, appropriate mood and affect Neuro: sensation intact, strength is 5/5 in upper and lower extremities,  muscle tone wnl Skin: no susupicious lesions or rashes  Back - Normal skin, Spine with normal alignment and no deformity.   Mild tenderness to lumbar vertebral process palpation.     right lumbar paraspinous muscles are significantly tender    TTP right gluteal musculature Straight leg raise positive right for low back pain without radicular symptoms Trendelenberg positive right Generalized nonpitting edema in right thigh extending down to patient's knee.  No erythema.  No shortness of breath.   Electronically signed by:  Diana Schultz D.[facility] 10:37 AM 09/30/22

## 2022-09-30 ENCOUNTER — Encounter: Payer: Self-pay | Admitting: Sports Medicine

## 2022-09-30 ENCOUNTER — Ambulatory Visit (INDEPENDENT_AMBULATORY_CARE_PROVIDER_SITE_OTHER): Payer: BC Managed Care – PPO | Admitting: Sports Medicine

## 2022-09-30 ENCOUNTER — Ambulatory Visit (INDEPENDENT_AMBULATORY_CARE_PROVIDER_SITE_OTHER): Payer: BC Managed Care – PPO

## 2022-09-30 VITALS — BP 138/84 | HR 63 | Ht 63.5 in | Wt 247.8 lb

## 2022-09-30 DIAGNOSIS — M545 Low back pain, unspecified: Secondary | ICD-10-CM | POA: Diagnosis not present

## 2022-09-30 DIAGNOSIS — M47816 Spondylosis without myelopathy or radiculopathy, lumbar region: Secondary | ICD-10-CM | POA: Diagnosis not present

## 2022-09-30 DIAGNOSIS — M7989 Other specified soft tissue disorders: Secondary | ICD-10-CM

## 2022-09-30 DIAGNOSIS — M5441 Lumbago with sciatica, right side: Secondary | ICD-10-CM

## 2022-09-30 MED ORDER — KETOROLAC TROMETHAMINE 60 MG/2ML IM SOLN
60.0000 mg | Freq: Once | INTRAMUSCULAR | Status: AC
  Administered 2022-09-30: 60 mg via INTRAMUSCULAR

## 2022-09-30 MED ORDER — METHYLPREDNISOLONE ACETATE 80 MG/ML IJ SUSP
80.0000 mg | Freq: Once | INTRAMUSCULAR | Status: AC
  Administered 2022-09-30: 80 mg via INTRAMUSCULAR

## 2022-09-30 NOTE — Patient Instructions (Signed)
-   Start meloxicam 15 mg daily x2 weeks.  If still having pain after 2 weeks, complete 3rd-week of meloxicam. May use remaining meloxicam as needed once daily for pain control.  Do not to use additional NSAIDs while taking meloxicam.  May use Tylenol [phone] mg 2 to 3 times a day for breakthrough pain.   Gabapentin 300 mg nightly as needed  You received an injection today. Seek immediate medical attention if the joint becomes red, extremely painful, or is oozing fluid.  Home Exercise Program for low back pain  Follow-up in 3 weeks

## 2022-10-01 ENCOUNTER — Other Ambulatory Visit: Payer: Self-pay

## 2022-10-01 ENCOUNTER — Other Ambulatory Visit (HOSPITAL_COMMUNITY): Payer: Self-pay

## 2022-10-05 DIAGNOSIS — D509 Iron deficiency anemia, unspecified: Secondary | ICD-10-CM | POA: Diagnosis not present

## 2022-10-06 ENCOUNTER — Other Ambulatory Visit (HOSPITAL_COMMUNITY): Payer: Self-pay

## 2022-10-12 DIAGNOSIS — D509 Iron deficiency anemia, unspecified: Secondary | ICD-10-CM | POA: Diagnosis not present

## 2022-10-13 ENCOUNTER — Other Ambulatory Visit (HOSPITAL_COMMUNITY): Payer: Self-pay

## 2022-10-14 ENCOUNTER — Other Ambulatory Visit (HOSPITAL_COMMUNITY): Payer: Self-pay

## 2022-10-14 DIAGNOSIS — I83893 Varicose veins of bilateral lower extremities with other complications: Secondary | ICD-10-CM | POA: Diagnosis not present

## 2022-10-14 DIAGNOSIS — I872 Venous insufficiency (chronic) (peripheral): Secondary | ICD-10-CM | POA: Diagnosis not present

## 2022-10-14 DIAGNOSIS — M79605 Pain in left leg: Secondary | ICD-10-CM | POA: Diagnosis not present

## 2022-10-14 DIAGNOSIS — M79662 Pain in left lower leg: Secondary | ICD-10-CM | POA: Diagnosis not present

## 2022-10-14 DIAGNOSIS — R600 Localized edema: Secondary | ICD-10-CM | POA: Diagnosis not present

## 2022-10-14 MED ORDER — TELMISARTAN 80 MG PO TABS
80.0000 mg | ORAL_TABLET | Freq: Every day | ORAL | 1 refills | Status: DC
  Filled 2022-10-14: qty 30, 30d supply, fill #0
  Filled 2022-11-22: qty 30, 30d supply, fill #1

## 2022-10-15 ENCOUNTER — Other Ambulatory Visit (HOSPITAL_COMMUNITY): Payer: Self-pay

## 2022-10-18 ENCOUNTER — Other Ambulatory Visit (HOSPITAL_COMMUNITY): Payer: Self-pay

## 2022-10-20 ENCOUNTER — Other Ambulatory Visit (HOSPITAL_COMMUNITY): Payer: Self-pay

## 2022-10-20 DIAGNOSIS — G8929 Other chronic pain: Secondary | ICD-10-CM | POA: Diagnosis not present

## 2022-10-20 DIAGNOSIS — Z9189 Other specified personal risk factors, not elsewhere classified: Secondary | ICD-10-CM | POA: Diagnosis not present

## 2022-10-20 DIAGNOSIS — R609 Edema, unspecified: Secondary | ICD-10-CM | POA: Diagnosis not present

## 2022-10-20 DIAGNOSIS — I10 Essential (primary) hypertension: Secondary | ICD-10-CM | POA: Diagnosis not present

## 2022-10-20 DIAGNOSIS — M545 Low back pain, unspecified: Secondary | ICD-10-CM | POA: Diagnosis not present

## 2022-10-20 MED ORDER — VALSARTAN-HYDROCHLOROTHIAZIDE 80-12.5 MG PO TABS
1.0000 | ORAL_TABLET | Freq: Every day | ORAL | 1 refills | Status: DC
  Filled 2022-10-20: qty 30, 30d supply, fill #0

## 2022-10-20 NOTE — Progress Notes (Addendum)
Diana Schultz D.[facility] [address] [place] [address] Phone: [phone]   Assessment and Plan:     1. Chronic right-sided low back pain with right-sided sciatica 2. Right leg swelling  -Chronic with exacerbation, subsequent visit - Overall no long-term improvement after methylprednisone/Toradol IM injection, HEP, course of meloxicam.  Patient did have temporary relief, however after medications were completed, patient's pain came back in its entirety - Unclear etiology of patient's ongoing pain.  Patient does have lower extremity edema likely of vascular origin and is undergoing workup with vascular department.  However, she does appear to have right-sided radicular symptoms which could be explained by moderate degenerative changes seen in lumbar x-ray from 09/30/2022 - Based on patient's failure to improve with >6 weeks of conservative therapy, pain frequently >6/10, pain limiting day-to-day activities, I feel it is necessary to proceed with lumbar MRI for further evaluation - Discontinue meloxicam - Recommend using Tylenol 500 to 1000 mg 2-3 times a day  Pertinent previous records reviewed include none   Follow Up: 3 days after MRI   Subjective:   I, [name], am serving as a Neurosurgeon for [facility] [employer]  Chief Complaint: low back pain    HPI:    09/30/2022 Patient is a 57 year old female complaining of low back pain. Patient c/o lower back and sacral pain radiating into the right gluteal region and anterior thigh. Sx x 2 months, had eval at [facility]. Received steroid injection with some relief, then sx came back. Had CT to r/o renal calculi. Notes significant swelling in the right thigh. Has dx of vein reflux in the R LE.Also c/o right groin pain. Sx worse when turning, side-lying. Has tried Gabapentin, topical analgesic, Tylenol, muscle relaxer, menthol rub, heat ice;  nothing provides relief. Gabapentin causing  drowsiness.    No recent imaging of the low back or hips, XR done today.    10/21/2022 Patient states that she is the same , finished meloxicam full 3 weeks and her pain was still a 6/10 , pain radiates to the right hip and down the right leg, doesn't like the way gabapentin makes her feel     Relevant Historical Information: Hypertension, history of DVT at 57 years old thought to be related to birth control with no symptoms since that time after discontinuing birth control, CSIs only gave her relief for 1 one day at that pain was 3/10   Additional pertinent review of systems negative.   Current Outpatient Medications:    cephALEXin (KEFLEX) 500 MG capsule, Take 1 capsule (500 mg total) by mouth 3 (three) times daily. (Patient not taking: Reported on 09/30/2022), Disp: 21 capsule, Rfl: 0   ciprofloxacin (CIPRO) 500 MG tablet, Take 1 tablet by mouth twice daily. (Patient not taking: Reported on 09/30/2022), Disp: 14 tablet, Rfl: 0   cyclobenzaprine (FLEXERIL) 5 MG tablet, Take 1 tablet (5 mg total) by mouth at bedtime as needed for muscle spasms., Disp: 20 tablet, Rfl: 0   erythromycin ophthalmic ointment, Apply to lower eyelid of affected eye four times daily for 10 days., Disp: 3.5 g, Rfl: 0   ferrous gluconate (FERGON) 324 MG tablet, Take one tablet (324 mg dose) by mouth daily., Disp: 100 tablet, Rfl: 1   meloxicam (MOBIC) 15 MG tablet, Take 1 tablet (15 mg total) by mouth daily., Disp: 30 tablet, Rfl: 0   metaxalone (SKELAXIN) 800 MG tablet, Take 1 tablet (800 mg total) by mouth  3 (three) times daily., Disp: 21 tablet, Rfl: 0   nirmatrelvir & ritonavir (PAXLOVID, 300/100,) 20 x 150 MG & 10 x 100MG  TBPK, Take as directed ., Disp: 30 tablet, Rfl: 0   ondansetron (ZOFRAN-ODT) 4 MG disintegrating tablet, Take 1 tablet (4 mg total) by mouth every 8 (eight) hours as needed for nausea or vomiting. (Patient not taking: Reported on 09/30/2022), Disp: 20 tablet, Rfl: 0   oxyCODONE (ROXICODONE) 5 MG  immediate release tablet, Take 1 tablet (5 mg total) by mouth every 4 (four) hours as needed for severe pain., Disp: 20 tablet, Rfl: 0   spironolactone (ALDACTONE) 50 MG tablet, Take 1 - 2 tablet by mouth Once a day 90 days, Disp: 180 tablet, Rfl: 0   telmisartan (MICARDIS) 80 MG tablet, Take 1 tablet (80 mg total) by mouth daily., Disp: 30 tablet, Rfl: 1   tirzepatide (MOUNJARO) 12.5 MG/0.5ML Pen, Inject 12.5 mg, Disp: , Rfl:    tirzepatide (MOUNJARO) 12.5 MG/0.5ML Pen, Inject 12.5 mg Subcutaneous once a week 30 days, Disp: 2 mL, Rfl: 2   tirzepatide (MOUNJARO) 12.5 MG/0.5ML Pen, Inject 12.5 mg Subcutaneous once a week 30 days, Disp: 2 mL, Rfl: 2   tirzepatide (MOUNJARO) 12.5 MG/0.5ML Pen, Inject 12.5 mg into the skin once a week., Disp: 2 mL, Rfl: 2   tirzepatide (MOUNJARO) 12.5 MG/0.5ML Pen, Inject 12.5 mg into the skin once a week., Disp: 6 mL, Rfl: 0   tirzepatide (MOUNJARO) 15 MG/0.5ML Pen, Inject 15mg , Disp: , Rfl:    tirzepatide (MOUNJARO) 15 MG/0.5ML Pen, Inject 15mg  Subcutaneous once a week 30 days, Disp: 2 mL, Rfl: 2   tirzepatide (MOUNJARO) 15 MG/0.5ML Pen, Inject 15 mg into the skin once a week., Disp: 2 mL, Rfl: 2   tirzepatide (MOUNJARO) 15 MG/0.5ML Pen, Inject 15 mg into the skin once a week., Disp: 2 mL, Rfl: 2   tirzepatide (MOUNJARO) 15 MG/0.5ML Pen, Inject 15 mg into the skin once a week., Disp: 2 mL, Rfl: 2   tirzepatide (MOUNJARO) 15 MG/0.5ML Pen, Inject 15 mg into the skin once a week., Disp: 6 mL, Rfl: 0   tirzepatide (MOUNJARO) 15 MG/0.5ML Pen, Inject 15mg  (0.77mL) under the skin once weekly., Disp: 6 mL, Rfl: 3   valsartan (DIOVAN) 40 MG tablet, Take 1 tablet (40 mg total) by mouth daily., Disp: 30 tablet, Rfl: 0   valsartan-hydrochlorothiazide (DIOVAN-HCT) 80-12.5 MG tablet, Take 1/2 tablet by mouth once daily., Disp: 45 tablet, Rfl: 1   valsartan-hydrochlorothiazide (DIOVAN-HCT) 80-12.5 MG tablet, Take 1 tablet by mouth daily., Disp: 30 tablet, Rfl: 1   Vitamin D,  Ergocalciferol, 50000 units CAPS, 1 capsule Orally Once a Week 90 days, Disp: 12 capsule, Rfl: 0   Objective:     Vitals:   10/21/22 0805  BP: 138/88  Pulse: (!) 59  SpO2: 98%  Weight: 238 lb (108 kg)  Height: 5\' 3"  (1.6 m)      Body mass index is 42.16 kg/m.    Physical Exam:    Gen: Appears well, nad, nontoxic and pleasant Psych: Alert and oriented, appropriate mood and affect Neuro: sensation intact, strength is 5/5 in upper and lower extremities, muscle tone wnl Skin: no susupicious lesions or rashes   Back - Normal skin, Spine with normal alignment and no deformity.   Mild tenderness to lumbar vertebral process palpation.     right lumbar paraspinous muscles are significantly tender    TTP right gluteal musculature Straight leg raise positive right for low back pain  without radicular symptoms Trendelenberg positive right Generalized nonpitting edema in right thigh extending down to patient's knee.  No erythema.  No shortness of breath.  Electronically signed by:  Diana Schultz D.[facility] 8:29 AM 10/21/22

## 2022-10-21 ENCOUNTER — Ambulatory Visit (INDEPENDENT_AMBULATORY_CARE_PROVIDER_SITE_OTHER): Payer: BC Managed Care – PPO | Admitting: Sports Medicine

## 2022-10-21 VITALS — BP 138/88 | HR 59 | Ht 63.0 in | Wt 238.0 lb

## 2022-10-21 DIAGNOSIS — G8929 Other chronic pain: Secondary | ICD-10-CM

## 2022-10-21 DIAGNOSIS — M5441 Lumbago with sciatica, right side: Secondary | ICD-10-CM | POA: Diagnosis not present

## 2022-10-21 DIAGNOSIS — M7989 Other specified soft tissue disorders: Secondary | ICD-10-CM

## 2022-10-21 NOTE — Patient Instructions (Addendum)
Good to see you  Discontinue meloxicam  Tylenol [phone] mg 2-3 times a day for pain relief  MRI referral  Follow up 3 days after Mri to discuss results

## 2022-10-25 ENCOUNTER — Other Ambulatory Visit (HOSPITAL_COMMUNITY): Payer: Self-pay

## 2022-10-26 ENCOUNTER — Other Ambulatory Visit: Payer: Self-pay | Admitting: Sports Medicine

## 2022-10-26 ENCOUNTER — Other Ambulatory Visit (HOSPITAL_COMMUNITY): Payer: Self-pay

## 2022-10-26 ENCOUNTER — Telehealth: Payer: Self-pay | Admitting: Sports Medicine

## 2022-10-26 DIAGNOSIS — M7989 Other specified soft tissue disorders: Secondary | ICD-10-CM | POA: Diagnosis not present

## 2022-10-26 DIAGNOSIS — G8929 Other chronic pain: Secondary | ICD-10-CM

## 2022-10-26 DIAGNOSIS — L299 Pruritus, unspecified: Secondary | ICD-10-CM | POA: Diagnosis not present

## 2022-10-26 DIAGNOSIS — I10 Essential (primary) hypertension: Secondary | ICD-10-CM | POA: Diagnosis not present

## 2022-10-26 DIAGNOSIS — I83891 Varicose veins of right lower extremities with other complications: Secondary | ICD-10-CM | POA: Diagnosis not present

## 2022-10-26 DIAGNOSIS — R600 Localized edema: Secondary | ICD-10-CM | POA: Diagnosis not present

## 2022-10-26 DIAGNOSIS — M5441 Lumbago with sciatica, right side: Secondary | ICD-10-CM

## 2022-10-26 DIAGNOSIS — I87393 Chronic venous hypertension (idiopathic) with other complications of bilateral lower extremity: Secondary | ICD-10-CM | POA: Diagnosis not present

## 2022-10-26 NOTE — Progress Notes (Unsigned)
CT lumbar spine without contras

## 2022-10-26 NOTE — Telephone Encounter (Signed)
Referral for CT placed called and spoke with patient and she was okay with the referral

## 2022-10-26 NOTE — Telephone Encounter (Signed)
Due to recent iron infusion, Imaging will not schedule MRI for another 3 months.  Pt upset, and unsure what plan will be and what to do with her pain.

## 2022-10-27 ENCOUNTER — Other Ambulatory Visit (HOSPITAL_COMMUNITY): Payer: Self-pay

## 2022-10-27 MED ORDER — HYDROCODONE-ACETAMINOPHEN 5-325 MG PO TABS
1.0000 | ORAL_TABLET | Freq: Four times a day (QID) | ORAL | 0 refills | Status: DC | PRN
  Filled 2022-10-27: qty 24, 6d supply, fill #0

## 2022-10-27 NOTE — Telephone Encounter (Signed)
Pt called, first avail CT at [facility] is June 6.  Can we look into another location that can get her in sooner?

## 2022-10-28 ENCOUNTER — Ambulatory Visit (HOSPITAL_BASED_OUTPATIENT_CLINIC_OR_DEPARTMENT_OTHER)
Admission: RE | Admit: 2022-10-28 | Discharge: 2022-10-28 | Disposition: A | Discharge status: Home / Self Care | Payer: BC Managed Care – PPO | Source: Ambulatory Visit | Attending: Sports Medicine | Admitting: Sports Medicine

## 2022-10-28 ENCOUNTER — Other Ambulatory Visit (HOSPITAL_COMMUNITY): Payer: Self-pay

## 2022-10-28 DIAGNOSIS — M5441 Lumbago with sciatica, right side: Secondary | ICD-10-CM | POA: Insufficient documentation

## 2022-10-28 DIAGNOSIS — M7989 Other specified soft tissue disorders: Secondary | ICD-10-CM | POA: Insufficient documentation

## 2022-10-28 DIAGNOSIS — M4316 Spondylolisthesis, lumbar region: Secondary | ICD-10-CM | POA: Diagnosis not present

## 2022-10-28 DIAGNOSIS — M5126 Other intervertebral disc displacement, lumbar region: Secondary | ICD-10-CM | POA: Diagnosis not present

## 2022-10-28 DIAGNOSIS — G8929 Other chronic pain: Secondary | ICD-10-CM | POA: Diagnosis not present

## 2022-10-28 MED ORDER — FUROSEMIDE 20 MG PO TABS
20.0000 mg | ORAL_TABLET | Freq: Every day | ORAL | 0 refills | Status: DC
  Filled 2022-10-28: qty 30, 30d supply, fill #0

## 2022-10-28 MED ORDER — POTASSIUM CHLORIDE ER 10 MEQ PO TBCR
10.0000 meq | EXTENDED_RELEASE_TABLET | Freq: Every day | ORAL | 0 refills | Status: DC
  Filled 2022-10-28: qty 30, 30d supply, fill #0

## 2022-11-01 DIAGNOSIS — Z0001 Encounter for general adult medical examination with abnormal findings: Secondary | ICD-10-CM | POA: Diagnosis not present

## 2022-11-01 DIAGNOSIS — E063 Autoimmune thyroiditis: Secondary | ICD-10-CM | POA: Diagnosis not present

## 2022-11-02 ENCOUNTER — Ambulatory Visit (INDEPENDENT_AMBULATORY_CARE_PROVIDER_SITE_OTHER): Payer: BC Managed Care – PPO | Admitting: Neurology

## 2022-11-02 ENCOUNTER — Encounter: Payer: Self-pay | Admitting: Neurology

## 2022-11-02 VITALS — BP 126/79 | HR 64 | Ht 64.0 in | Wt 243.8 lb

## 2022-11-02 DIAGNOSIS — I280 Arteriovenous fistula of pulmonary vessels: Secondary | ICD-10-CM | POA: Diagnosis not present

## 2022-11-02 DIAGNOSIS — G4733 Obstructive sleep apnea (adult) (pediatric): Secondary | ICD-10-CM | POA: Diagnosis not present

## 2022-11-02 DIAGNOSIS — Z86718 Personal history of other venous thrombosis and embolism: Secondary | ICD-10-CM

## 2022-11-02 NOTE — Progress Notes (Signed)
Provider:  Melvyn Novas, MD  Primary Care Physician:  Laqueta Due., MD [address] [place] [address]     Referring Provider: Helane Rima, DO.       Chief Complaint according to patient   Patient presents with:     New Patient (Initial Visit)           HISTORY OF PRESENT ILLNESS:  Diana Schultz is a 57 y.o. female patient who is here for revisit 11/02/2022 for  CPAP compliance visit, new on CPAP.  Marland Kitchen  Chief concern according to patient :  I have been getting used to CPAP for a year now- doing well with memory foam. This is really Compliance has been excellent.  97% by days, 87% by hours with an average on days used of 6 hours 45 minutes.  AutoSet between 5 and 16 cm water pressure range 2 cm water pressure relief on expiration, residual AHI 0.1.  This has to be compared to a baseline of 35 AHI per home sleep test and is having very significant reduction.  95th percentile pressure is 8.6 cm water 95th percentile air leak is 0.4 L a minute this also is a rare finding.  Epworth Sleepiness Scale endorsed at 4 points, fatigue severity score at 27 points.    Medication list reviewed and shrunken.     Diana Schultz is a 57 y.o.  Biracial female patient seen here as a referral on 09/22/2021 from weight and wellness  Chief concern according to patient :  Pt referred for sleep consult, morbid obesity and sleep disorder.  Previous PSG done in 2019, no sleep apnea then at [facility] point. In lab test- . Pt c/o of insomnia,loud snoring, and sleepiness. She reports her mind racing and her husband reports her snoring loudly, but she feels she wasn't sleeping.   May be due to the medications she is on. Works now day shift. Here to r/o sleep apnea. 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    Her sleep test is listed in EPIC as a SPLIT night but no results were filed .    09-22-2021 Reality D Boudoin  has a past medical  history of Adrenal adenoma, Anemia, Arteriovenous malformation, Depression, Diabetes mellitus without complication (HCC), Diverticulitis, DVT (deep venous thrombosis) (HCC), Fatty liver, Gallbladder disease, Hypertension, Lactose intolerance, Morbid obesity (HCC), Ovarian cyst, Prediabetes, Thalassemia, Umbilical hernia, Vaginal delivery (1984, 1989), and Vitamin D deficiency..    Sleep relevant medical history: Nocturia 3-4 times (!) slowed down on Ozempic.     Family medical /sleep history: No other family member on CPAP with OSA, neither insomnia, nor sleep walkers.    Social history:  Patient is working as Magazine features editor- and lives in a household with spouse, grown children , 32 and 39. 1 grandchild, 72 years old.  The patient currently works now in daytime,  used to work in shifts( Chief Technology Officer,) Pets : 2 dogs present. Tobacco use-none .   ETOH use : none ,  Caffeine intake in form of Coffee( /) Soda( less frequent- on Ozempic) Tea ( sweet tea, 16 ounces) or energy drinks. Regular exercise in form of walking.   Hobbies :none          Review of Systems: Out of a complete 14 system review, the patient complains of only the following symptoms, and all other  reviewed systems are negative.:    Weight loss from 311 to 242 #. Mounjaro.  Fatigue, sleepiness , snoring, fragmented sleep,  No onger insomnia, no need for trazodone. How likely are you to doze in the following situations: 0 = not likely, 1 = slight chance, 2 = moderate chance, 3 = high chance   Sitting and Reading? Watching Television? Sitting inactive in a public place (theater or meeting)? As a passenger in a car for an hour without a break? Lying down in the afternoon when circumstances permit? Sitting and talking to someone? Sitting quietly after lunch without alcohol? In a car, while stopped for a few minutes in traffic?   Total = 4/ 24 points   FSS endorsed at 27/ 63 points.   Social History    Socioeconomic History   Marital status: Married    Spouse name: Susann Givens   Number of children: 2   Years of education: Not on file   Highest education level: Some college, no degree  Occupational History   Occupation: Lawyer, Scientist, clinical (histocompatibility and immunogenetics)  by training, does prn works  Tobacco Use   Smoking status: Never   Smokeless tobacco: Never  Vaping Use   Vaping Use: Never used  Substance and Sexual Activity   Alcohol use: No    Comment: rare    Drug use: No   Sexual activity: Not Currently    Birth control/protection: Surgical  Other Topics Concern   Not on file  Social History Narrative   Original from [place], household: pt and husband   Lives at home with husband and children   Right handed   Caffeine: 1 cup sweet tea every day   Social Determinants of Health   Financial Resource Strain: Not on file  Food Insecurity: Not on file  Transportation Needs: Not on file  Physical Activity: Not on file  Stress: Not on file  Social Connections: Not on file    Family History  Problem Relation Age of Onset   Cancer Mother    Hypertension Mother    Diabetes Mother    Stroke Mother        early in life   Endometrial cancer Mother    Hyperlipidemia Mother    Depression Mother    Mental retardation Father    Breast cancer Father    Depression Father    Hypertension Sister    Hypertension Brother    Heart failure Paternal Grandfather    CAD Other        GP, late in like   Colon cancer Neg Hx     Past Medical History:  Diagnosis Date   Adrenal adenoma    Anemia    Arteriovenous malformation    Depression    Diabetes mellitus without complication (HCC)    borderline - no meds   Diverticulitis    DVT (deep venous thrombosis) (HCC)    Hx at age 8 blood clot in right leg, d/c birth control pills and no other problems   Fatty liver    Gallbladder disease    Hypertension    history,lost weight, controlled with diet and exercise, no current med   Lactose intolerance    Morbid  obesity (HCC)    Ovarian cyst    Prediabetes    Thalassemia    Umbilical hernia    Vaginal delivery 1984, 1989   Vitamin D deficiency     Past Surgical History:  Procedure Laterality Date   ABDOMINAL HYSTERECTOMY     ABLATION  03/23/13   [facility]   APPENDECTOMY     CHOLECYSTECTOMY     DILATION AND CURETTAGE OF UTERUS     DILATION AND CURETTAGE OF UTERUS N/A 11/01/2013   Procedure: DILATATION AND CURETTAGE;  Surgeon: Catalina Antigua, MD;  Location: [facility];  Service: Gynecology;  Laterality: N/A;   DILITATION & CURRETTAGE/HYSTROSCOPY WITH NOVASURE ABLATION N/A 03/23/2013   Procedure: DILATATION & CURETTAGE/HYSTEROSCOPY WITH NOVASURE ABLATION;  Surgeon: Willodean Rosenthal, MD;  Location: [facility];  Service: Gynecology;  Laterality: N/A;   LEEP     TUBAL LIGATION     VAGINAL HYSTERECTOMY N/A 12/31/2013   Procedure: HYSTERECTOMY VAGINAL;  Surgeon: Willodean Rosenthal, MD;  Location: [facility];  Service: Gynecology;  Laterality: N/A;     Current Outpatient Medications on File Prior to Visit  Medication Sig Dispense Refill   furosemide (LASIX) 20 MG tablet Take 1 tablet (20 mg) by mouth daily. 30 tablet 0   HYDROcodone-acetaminophen (NORCO/VICODIN) 5-325 MG tablet Take 1 tablet by mouth every 6 (six) hours as needed for up to 6 days. 24 tablet 0   potassium chloride (KLOR-CON 10) 10 MEQ tablet Take 1 tablet (10 mEq) by mouth daily with furosemide 30 tablet 0   telmisartan (MICARDIS) 80 MG tablet Take 1 tablet (80 mg total) by mouth daily. 30 tablet 1   tirzepatide (MOUNJARO) 15 MG/0.5ML Pen Inject 15 mg into the skin once a week. 2 mL 2   No current facility-administered medications on file prior to visit.    Allergies  Allergen Reactions   Amlodipine Cough and Swelling    Other reaction(s): Cough (ALLERGY/intolerance)    Metoprolol Swelling   Benicar [Olmesartan] Cough   Hydrochlorothiazide     Other reaction(s): Cough (ALLERGY/intolerance)   Lactose Intolerance (Gi)    Lisinopril  Cough   Nebivolol     Other reaction(s): Other (See Comments) bradycardia   Semaglutide(0.25 Or 0.5mg -Dos) Itching    Itching all over     DIAGNOSTIC DATA (LABS, IMAGING, TESTING) - I reviewed patient records, labs, notes, testing and imaging myself where available.  Lab Results  Component Value Date   WBC 11.3 (H) 08/06/2022   HGB 12.0 08/06/2022   HCT 37.1 08/06/2022   MCV 78.6 (L) 08/06/2022   PLT 208 08/06/2022      Component Value Date/Time   NA 136 08/06/2022 1429   NA 138 09/11/2019 1251   K 3.7 08/06/2022 1429   CL 106 08/06/2022 1429   CO2 26 08/06/2022 1429   GLUCOSE 100 (H) 08/06/2022 1429   BUN 19 08/06/2022 1429   BUN 18 09/11/2019 1251   CREATININE 0.66 08/06/2022 1429   CALCIUM 8.9 08/06/2022 1429   PROT 8.5 (H) 03/18/2022 0909   PROT 7.3 09/11/2019 1251   ALBUMIN 4.3 03/18/2022 0909   ALBUMIN 4.3 09/11/2019 1251   AST 27 03/18/2022 0909   ALT 24 03/18/2022 0909   ALKPHOS 66 03/18/2022 0909   BILITOT 0.9 03/18/2022 0909   BILITOT 0.7 09/11/2019 1251   GFRNONAA >60 08/06/2022 1429   GFRAA 89 09/11/2019 1251   Lab Results  Component Value Date   CHOL 158 09/11/2019   HDL 58 09/11/2019   LDLCALC 84 09/11/2019   TRIG 87 09/11/2019   CHOLHDL 2.7 09/11/2019   Lab Results  Component Value Date   HGBA1C 6.0 (H) 09/11/2019   Lab Results  Component Value Date   VITAMINB12 427 09/11/2019   Lab Results  Component Value Date   TSH 2.140 09/11/2019  IMPRESSION:  This HST confirms the presence of severe sleep apnea nearly all sleep was recorded in supine sleep position at the end the patient entered several phases of REM sleep.  These were associated with brief hypoxic events, unsustained.  No central apneas were recorded.   RECOMMENDATION: This patient has been benefiting from trazodone which helps her to fall asleep and somewhat stay asleep.  However she has reported that she did not feel she has restful and restorative sleep at all.  Her sleep  architecture shows fragmented sleep for the second half of the night while sleep until 2 AM showed fairly normal architecture.     I would recommend to treat this patient with autotitration CPAP , with a pressure range from 5 through 16 cmH2O . 2 centimeter H20 for EPR, heated humidification and I would very much prefer to have her fitted in person for a mask of her comfort and choice. PHYSICAL EXAM:  Today's Vitals   11/02/22 0814  BP: 126/79  Pulse: 64  Weight: 243 lb 12.8 oz (110.6 kg)  Height: 5\' 4"  (1.626 m)   Body mass index is 41.85 kg/m.   Wt Readings from Last 3 Encounters:  11/02/22 243 lb 12.8 oz (110.6 kg)  10/21/22 238 lb (108 kg)  09/30/22 247 lb 12.8 oz (112.4 kg)     Ht Readings from Last 3 Encounters:  11/02/22 5\' 4"  (1.626 m)  10/21/22 5\' 3"  (1.6 m)  09/30/22 5' 3.5" (1.613 m)      General: The patient is awake, alert and appears not in acute distress. The patient is well groomed. Head: Normocephalic, atraumatic. Neck is supple.  Mallampati  3 plus, partial dentures.,  neck circumference:16.5  inches . Nasal airflow  patent.   Dental status: partial Cardiovascular:  Regular rate and cardiac rhythm by pulse,  without distended neck veins. Respiratory: Lungs are clear to auscultation.  Skin:  Without evidence of ankle edema, or rash. Trunk: The patient's posture is erect.   Neurologic exam : The patient is awake and alert, oriented to place and time.   Memory subjective described as intact.  Attention span & concentration ability appears normal.  Speech is fluent,  without  dysarthria, dysphonia or aphasia.  Mood and affect are appropriate.   Cranial nerves: no loss of smell or taste reported , had covid 19 in 2022.  Pupils are equal and briskly reactive to light. Funduscopic exam .  Extraocular movements in vertical and horizontal planes were intact and without nystagmus. No Diplopia. Visual fields by finger perimetry are intact. Hearing was intact to  soft voice and finger rubbing.    Facial sensation intact to fine touch.  Facial motor strength is symmetric and tongue and uvula move midline.  Neck ROM : rotation, tilt and flexion extension were normal for age and shoulder shrug was symmetrical.    Motor exam:  Symmetric bulk, tone and ROM.  has knee pain.  Normal tone without cog-wheeling, symmetric grip strength .   Sensory:  Fine touch and vibration were normal.  Proprioception tested in the upper extremities was normal.   Coordination: Rapid alternating movements in the fingers/hands were of normal speed.  The Finger-to-nose maneuver was intact without evidence of ataxia, dysmetria or tremor.   Gait and station: Patient could rise unassisted from a seated position, walked without assistive device.  Stance is of normal width/ base and the patient turned with 3 steps.  Toe and heel walk were deferred.  Deep tendon reflexes:  in the upper and lower extremities are symmetric and intact.     ASSESSMENT AND PLAN 57 y.o. year old female  here with:    1) Severe OSA excellently controlled on CPAP auto- titration, well fitting mask.   2) successfully losing weight, on Mounjaro. BMI now under 42, no longer prediabetic, Hba1c 5.2, and less need for HTN medication, resolved hypersomnia,and improved fatigue.   3) Rv in 12 months.    Follow up  through our NP within 12 months.   I would like to thank Helane Rima, DO  and Laqueta Due., Md [address] [place],  [place] [address] for allowing me to meet with and to take care of this pleasant patient.    After spending a total time of  20  minutes face to face and additional time for physical and neurologic examination, review of laboratory studies,  personal review of imaging studies, reports and results of other testing and review of referral information / records as far as provided in visit,   Electronically signed by: Melvyn Novas, MD 11/02/2022 8:59 AM  [facility] Board certified by The [employer] of Sleep Medicine and Diplomate of the [employer] of Sleep Medicine. Board certified In Neurology through the ABPN, Fellow of the [employer] of Neu[employer]. [facility].

## 2022-11-02 NOTE — Patient Instructions (Signed)
Quality Sleep Information, Adult Quality sleep is important for your mental and physical health. It also improves your quality of life. Quality sleep means you: Are asleep for most of the time you are in bed. Fall asleep within 30 minutes. Wake up no more than once a night. Are awake for no longer than 20 minutes if you do wake up during the night. Most adults need 7-8 hours of quality sleep each night. How can poor sleep affect me? If you do not get enough quality sleep, you may have: Mood swings. Daytime sleepiness. Decreased alertness, reaction time, and concentration. Sleep disorders, such as insomnia and sleep apnea. Difficulty with: Solving problems. Coping with stress. Paying attention. These issues may affect your performance and productivity at work, school, and home. Lack of sleep may also put you at higher risk for accidents, suicide, and risky behaviors. If you do not get quality sleep, you may also be at higher risk for several health problems, including: Infections. Type 2 diabetes. Heart disease. High blood pressure. Obesity. Worsening of long-term conditions, like arthritis, kidney disease, depression, Parkinson's disease, and epilepsy. What actions can I take to get more quality sleep? Sleep schedule and routine Stick to a sleep schedule. Go to sleep and wake up at about the same time each day. Do not try to sleep less on weekdays and make up for lost sleep on weekends. This does not work. Limit naps during the day to 30 minutes or less. Do not take naps in the late afternoon. Make time to relax before bed. Reading, listening to music, or taking a hot bath promotes quality sleep. Make your bedroom a place that promotes quality sleep. Keep your bedroom dark, quiet, and at a comfortable room temperature. Make sure your bed is comfortable. Avoid using electronic devices that give off bright blue light for 30 minutes before bedtime. Your brain perceives bright blue light  as sunlight. This includes television, phones, and computers. If you are lying awake in bed for longer than 20 minutes, get up and do a relaxing activity until you feel sleepy. Lifestyle     Try to get at least 30 minutes of exercise on most days. Do not exercise 2-3 hours before going to bed. Do not use any products that contain nicotine or tobacco. These products include cigarettes, chewing tobacco, and vaping devices, such as e-cigarettes. If you need help quitting, ask your health care provider. Do not drink caffeinated beverages for at least 8 hours before going to bed. Coffee, tea, and some sodas contain caffeine. Do not drink alcohol or eat large meals close to bedtime. Try to get at least 30 minutes of sunlight every day. Morning sunlight is best. Medical concerns Work with your health care provider to treat medical conditions that may affect sleeping, such as: Nasal obstruction. Snoring. Sleep apnea and other sleep disorders. Talk to your health care provider if you think any of your prescription medicines may cause you to have difficulty falling or staying asleep. If you have sleep problems, talk with a sleep consultant. If you think you have a sleep disorder, talk with your health care provider about getting evaluated by a specialist. Where to find more information Sleep Foundation: sleepfoundation.org American Academy of Sleep Medicine: aasm.org Centers for Disease Control and Prevention (CDC): cdc.gov Contact a health care provider if: You have trouble getting to sleep or staying asleep. You often wake up very early in the morning and cannot get back to sleep. You have daytime sleepiness. You   have daytime sleep attacks of suddenly falling asleep and sudden muscle weakness (narcolepsy). You have a tingling sensation in your legs with a strong urge to move your legs (restless legs syndrome). You stop breathing briefly during sleep (sleep apnea). You think you have a sleep  disorder or are taking a medicine that is affecting your quality of sleep. Summary Most adults need 7-8 hours of quality sleep each night. Getting enough quality sleep is important for your mental and physical health. Make your bedroom a place that promotes quality sleep, and avoid things that may cause you to have poor sleep, such as alcohol, caffeine, smoking, or large meals. Talk to your health care provider if you have trouble falling asleep or staying asleep. This information is not intended to replace advice given to you by your health care provider. Make sure you discuss any questions you have with your health care provider. Document Revised: 09/30/2021 Document Reviewed: 09/30/2021 Elsevier Patient Education  2023 Elsevier Inc.  

## 2022-11-03 ENCOUNTER — Ambulatory Visit (INDEPENDENT_AMBULATORY_CARE_PROVIDER_SITE_OTHER): Payer: BC Managed Care – PPO | Admitting: Sports Medicine

## 2022-11-03 VITALS — HR 66 | Ht 64.0 in | Wt 244.0 lb

## 2022-11-03 DIAGNOSIS — M47816 Spondylosis without myelopathy or radiculopathy, lumbar region: Secondary | ICD-10-CM | POA: Diagnosis not present

## 2022-11-03 DIAGNOSIS — M48062 Spinal stenosis, lumbar region with neurogenic claudication: Secondary | ICD-10-CM

## 2022-11-03 DIAGNOSIS — M5441 Lumbago with sciatica, right side: Secondary | ICD-10-CM | POA: Diagnosis not present

## 2022-11-03 DIAGNOSIS — G8929 Other chronic pain: Secondary | ICD-10-CM | POA: Diagnosis not present

## 2022-11-03 NOTE — Patient Instructions (Addendum)
Good to see you  Epidural right L4-5 Follow up 2 weeks after injection to discuss results

## 2022-11-03 NOTE — Progress Notes (Addendum)
Diana Schultz D.[facility] [address] [place] [address] Phone: [phone]   Assessment and Plan:     1. Chronic right-sided low back pain with right-sided sciatica 2. Spinal stenosis of lumbar region with neurogenic claudication 3. Lumbar facet arthropathy  -Chronic with exacerbation, subsequent visit - Reviewed patient's CT lumbar spine with patient which showed right greater than left lateral recess and neuroforaminal stenosis at L4-5, which is most likely causing patient's back pain and right-sided radicular symptoms.  Also showed severe lower lumbar facet arthrosis with grade 1 anterior listhesis L4 on L5, and moderate neuroforaminal stenosis at L3-4 and L5-S1 - Patient has not improved with conservative therapy which has included methylprednisone/Toradol IM injection, HEP, meloxicam course, hydrocodone. - Patient elected to proceed with epidural CSI to right-sided L4-5 - Continue Tylenol for day-to-day pain relief  Pertinent previous records reviewed include lumbar CT from 10/28/2022   Follow Up: 2 weeks after epidural to review benefit and discuss treatment plan   Subjective:   I, Diana Schultz, am serving as a Neurosurgeon for Doctor [employer]  Chief Complaint: low back pain    HPI:    09/30/2022 Patient is a 57 year old female complaining of low back pain. Patient c/o lower back and sacral pain radiating into the right gluteal region and anterior thigh. Sx x 2 months, had eval at [facility]. Received steroid injection with some relief, then sx came back. Had CT to r/o renal calculi. Notes significant swelling in the right thigh. Has dx of vein reflux in the R LE.Also c/o right groin pain. Sx worse when turning, side-lying. Has tried Gabapentin, topical analgesic, Tylenol, muscle relaxer, menthol rub, heat ice;  nothing provides relief. Gabapentin causing drowsiness.    No recent imaging of the low back or hips, XR done today.      10/21/2022 Patient states that she is the same , finished meloxicam full 3 weeks and her pain was still a 6/10 , pain radiates to the right hip and down the right leg, doesn't like the way gabapentin makes her feel    11/04/2022 Patient states pain is getting worse  norcos aren't touching the pain     Relevant Historical Information: Hypertension, history of DVT at 57 years old thought to be related to birth control with no symptoms since that time after discontinuing birth control, CSIs only gave her relief for 1 one day at that pain was 3/10     Additional pertinent review of systems negative.   Current Outpatient Medications:    furosemide (LASIX) 20 MG tablet, Take 1 tablet (20 mg) by mouth daily., Disp: 30 tablet, Rfl: 0   potassium chloride (KLOR-CON 10) 10 MEQ tablet, Take 1 tablet (10 mEq) by mouth daily with furosemide, Disp: 30 tablet, Rfl: 0   telmisartan (MICARDIS) 80 MG tablet, Take 1 tablet (80 mg total) by mouth daily., Disp: 30 tablet, Rfl: 1   tirzepatide (MOUNJARO) 15 MG/0.5ML Pen, Inject 15 mg into the skin once a week., Disp: 2 mL, Rfl: 2   Objective:     Vitals:   11/03/22 0948  Pulse: 66  SpO2: 98%  Weight: 244 lb (110.7 kg)  Height: 5\' 4"  (1.626 m)      Body mass index is 41.88 kg/m.    Physical Exam:    Gen: Appears well, nad, nontoxic and pleasant Psych: Alert and oriented, appropriate mood and affect Neuro: sensation intact, strength is 5/5 in upper and lower  extremities, muscle tone wnl Skin: no susupicious lesions or rashes   Back - Normal skin, Spine with normal alignment and no deformity.   Mild tenderness to lumbar vertebral process palpation.   right lumbar paraspinous muscles are significantly tender   TTP right gluteal musculature Straight leg raise positive right for low back pain without radicular symptoms Trendelenberg positive right Generalized nonpitting edema in right thigh extending down to patient's knee.  No erythema.  No  shortness of breath.    Electronically signed by:  Diana Schultz D.[facility] 10:05 AM 11/03/22

## 2022-11-10 ENCOUNTER — Other Ambulatory Visit: Payer: Self-pay | Admitting: *Deleted

## 2022-11-10 ENCOUNTER — Ambulatory Visit
Admission: RE | Admit: 2022-11-10 | Discharge: 2022-11-10 | Disposition: A | Discharge status: Home / Self Care | Payer: BC Managed Care – PPO | Source: Ambulatory Visit | Attending: Sports Medicine | Admitting: Sports Medicine

## 2022-11-10 DIAGNOSIS — D3501 Benign neoplasm of right adrenal gland: Secondary | ICD-10-CM

## 2022-11-10 DIAGNOSIS — G8929 Other chronic pain: Secondary | ICD-10-CM

## 2022-11-10 DIAGNOSIS — E213 Hyperparathyroidism, unspecified: Secondary | ICD-10-CM

## 2022-11-10 DIAGNOSIS — M545 Low back pain, unspecified: Secondary | ICD-10-CM | POA: Diagnosis not present

## 2022-11-10 DIAGNOSIS — M5416 Radiculopathy, lumbar region: Secondary | ICD-10-CM | POA: Diagnosis not present

## 2022-11-10 MED ORDER — METHYLPREDNISOLONE ACETATE 40 MG/ML INJ SUSP (RADIOLOG
80.0000 mg | Freq: Once | INTRAMUSCULAR | Status: AC
  Administered 2022-11-10: 80 mg via EPIDURAL

## 2022-11-10 MED ORDER — IOPAMIDOL (ISOVUE-M 200) INJECTION 41%
1.0000 mL | Freq: Once | INTRAMUSCULAR | Status: AC
  Administered 2022-11-10: 1 mL via EPIDURAL

## 2022-11-10 NOTE — Discharge Instructions (Signed)

## 2022-11-16 ENCOUNTER — Ambulatory Visit
Admission: RE | Admit: 2022-11-16 | Discharge: 2022-11-16 | Disposition: A | Discharge status: Home / Self Care | Payer: BC Managed Care – PPO | Source: Ambulatory Visit | Attending: *Deleted | Admitting: *Deleted

## 2022-11-16 DIAGNOSIS — D3501 Benign neoplasm of right adrenal gland: Secondary | ICD-10-CM

## 2022-11-16 DIAGNOSIS — E213 Hyperparathyroidism, unspecified: Secondary | ICD-10-CM | POA: Diagnosis not present

## 2022-11-16 DIAGNOSIS — D3502 Benign neoplasm of left adrenal gland: Secondary | ICD-10-CM | POA: Diagnosis not present

## 2022-11-22 ENCOUNTER — Ambulatory Visit (INDEPENDENT_AMBULATORY_CARE_PROVIDER_SITE_OTHER): Payer: BC Managed Care – PPO | Admitting: Sports Medicine

## 2022-11-22 ENCOUNTER — Other Ambulatory Visit: Payer: Self-pay | Admitting: Sports Medicine

## 2022-11-22 ENCOUNTER — Other Ambulatory Visit (HOSPITAL_COMMUNITY): Payer: Self-pay

## 2022-11-22 VITALS — BP 122/78 | HR 69 | Ht 64.0 in | Wt 244.0 lb

## 2022-11-22 DIAGNOSIS — E1169 Type 2 diabetes mellitus with other specified complication: Secondary | ICD-10-CM | POA: Diagnosis not present

## 2022-11-22 DIAGNOSIS — M47816 Spondylosis without myelopathy or radiculopathy, lumbar region: Secondary | ICD-10-CM | POA: Diagnosis not present

## 2022-11-22 DIAGNOSIS — I152 Hypertension secondary to endocrine disorders: Secondary | ICD-10-CM | POA: Diagnosis not present

## 2022-11-22 DIAGNOSIS — E559 Vitamin D deficiency, unspecified: Secondary | ICD-10-CM | POA: Diagnosis not present

## 2022-11-22 DIAGNOSIS — M48062 Spinal stenosis, lumbar region with neurogenic claudication: Secondary | ICD-10-CM

## 2022-11-22 DIAGNOSIS — K760 Fatty (change of) liver, not elsewhere classified: Secondary | ICD-10-CM | POA: Diagnosis not present

## 2022-11-22 DIAGNOSIS — M5441 Lumbago with sciatica, right side: Secondary | ICD-10-CM

## 2022-11-22 DIAGNOSIS — G8929 Other chronic pain: Secondary | ICD-10-CM | POA: Diagnosis not present

## 2022-11-22 DIAGNOSIS — M7989 Other specified soft tissue disorders: Secondary | ICD-10-CM

## 2022-11-22 NOTE — Progress Notes (Unsigned)
Neurosurgery referral.

## 2022-11-22 NOTE — Progress Notes (Signed)
Diana Schultz D.[facility] [address] [place] [address] Phone: [phone]   Assessment and Plan:     1. Spinal stenosis of lumbar region with neurogenic claudication 2. Chronic right-sided low back pain with right-sided sciatica 3. Lumbar facet arthropathy  -Chronic with exacerbation, subsequent visit - Patient received mild and short-term relief after right-sided L4-5 epidural CSI performed on 11/10/2022 with "not even 20% relief" in pain returning to prior injection levels.  Patient did not see significant improvement in function or decreased need for pain medication after epidural CSI - Due to failed improvement with epidural CSI, and patient's ongoing low back pain, discussed bilateral L4-L5 facet injections which patient is agreeable to.  Order placed today. - Neurosurgery referral placed today  Pertinent previous records reviewed include Epidural procedure note from 11/10/2022   Follow Up: As needed if no improvement or worsening of symptoms or if patient needs assistance before she is able to see neurosurgery.  Could discuss repeat facet versus epidural injections based on patient's improvement   Subjective:   I, Diana Schultz, am serving as a Neurosurgeon for Doctor Richardean Sale  Chief Complaint: low back pain    HPI:    09/30/2022 Patient is a 57 year old female complaining of low back pain. Patient c/o lower back and sacral pain radiating into the right gluteal region and anterior thigh. Sx x 2 months, had eval at [facility]. Received steroid injection with some relief, then sx came back. Had CT to r/o renal calculi. Notes significant swelling in the right thigh. Has dx of vein reflux in the R LE.Also c/o right groin pain. Sx worse when turning, side-lying. Has tried Gabapentin, topical analgesic, Tylenol, muscle relaxer, menthol rub, heat ice;  nothing provides relief. Gabapentin causing drowsiness.    10/21/2022 Patient states that  she is the same , finished meloxicam full 3 weeks and her pain was still a 6/10 , pain radiates to the right hip and down the right leg, doesn't like the way gabapentin makes her feel    11/04/2022 Patient states pain is getting worse  norcos aren't touching the pain    11/23/2022 Patient states that she is still in pain her pain had decreased to a 8/10 but now it is 10/10     Relevant Historical Information: Hypertension, history of DVT at 57 years old thought to be related to birth control with no symptoms since that time after discontinuing birth control, CSIs only gave her relief for 1 one day at that pain was 3/10   Additional pertinent review of systems negative.   Current Outpatient Medications:    furosemide (LASIX) 20 MG tablet, Take 1 tablet (20 mg) by mouth daily., Disp: 30 tablet, Rfl: 0   potassium chloride (KLOR-CON 10) 10 MEQ tablet, Take 1 tablet (10 mEq) by mouth daily with furosemide, Disp: 30 tablet, Rfl: 0   telmisartan (MICARDIS) 80 MG tablet, Take 1 tablet (80 mg total) by mouth daily., Disp: 30 tablet, Rfl: 1   tirzepatide (MOUNJARO) 15 MG/0.5ML Pen, Inject 15 mg into the skin once a week., Disp: 2 mL, Rfl: 2   Objective:     Vitals:   11/22/22 1359  BP: 122/78  Pulse: 69  SpO2: 98%  Weight: 244 lb (110.7 kg)  Height: 5\' 4"  (1.626 m)      Body mass index is 41.88 kg/m.    Physical Exam:     Gen: Appears well, nad, nontoxic and pleasant  Psych: Alert and oriented, appropriate mood and affect Neuro: sensation intact, strength is 5/5 in upper and lower extremities, muscle tone wnl Skin: no susupicious lesions or rashes   Back - Normal skin, Spine with normal alignment and no deformity.   Mild tenderness to lumbar vertebral process palpation.   right lumbar paraspinous muscles are significantly tender   TTP right gluteal musculature Straight leg raise positive right for low back pain without radicular symptoms Trendelenberg positive right Generalized  nonpitting edema in right thigh extending down to patient's knee.  No erythema.  No shortness of breath.    Electronically signed by:  Diana Schultz D.[facility] 2:11 PM 11/22/22

## 2022-11-22 NOTE — Patient Instructions (Addendum)
Good to see you  Facet bilat L4-L5 As needed follow up

## 2022-11-22 NOTE — Telephone Encounter (Signed)
Pt will follow up in clinic and was notified that Neurosurgery referral has been placed

## 2022-11-23 ENCOUNTER — Other Ambulatory Visit (HOSPITAL_COMMUNITY): Payer: Self-pay

## 2022-11-25 ENCOUNTER — Other Ambulatory Visit: Payer: BC Managed Care – PPO

## 2022-11-30 ENCOUNTER — Telehealth: Payer: Self-pay | Admitting: Sports Medicine

## 2022-11-30 ENCOUNTER — Telehealth: Payer: Self-pay

## 2022-11-30 NOTE — Telephone Encounter (Signed)
Action items: Can we please try and make an urgent referral for patient to see neurosurgery.  She currently has an appointment scheduled in August but she is in severe pain and would like to be seen as soon as possible.  Also, I got approval therapy to peer for patient to have a median branch block.  If we can please help schedule this as soon as possible for patient.   I had a peer to peer earlier today and was told that facet injections would not be approved by patient's insurance company because patient did not have a systemic inflammatory condition causing pain or clear cystic structure that was causing radicular pain.  They would instead approve a median branch block to be performed possibly as a precursor to radiofrequency ablation.  Order was changed to median branch block.  I called and spoke with patient.  I discussed with her the denial for facet injections and instead the approval for medial branch block.  Patient is agreeable to have MBB performed.  She states when she made an establishing care visit with neurosurgery, they would not schedule her until after her MRI could be performed, so currently her establishing care visit is not until mid August.  Patient already has a CT scan on file that is up-to-date.  MRI cannot be performed as patient had an iron infusion and has to wait 90 days until after fusion.  We will refer patient to alternative neurosurgery to hopefully find a sooner appointment.  In the meantime, recommend using OTC Aleve 220 mg 2 tablets twice a day and OTC acetaminophen 500 to 1000 mg 3 times a day for pain relief.  Patient has had no significant relief with gabapentin, meloxicam, hydrocodone.  Discussed the possibility of using prednisone, however patient's blood pressure has already been elevated due to her current pain level.

## 2022-11-30 NOTE — Telephone Encounter (Signed)
Patient called stating that her pain in getting worse and "this is awful!". She is currently waiting on approval for the facet injection.  She asked what else she can do in the mean time?  Please advise.

## 2022-11-30 NOTE — Telephone Encounter (Signed)
Received a faxed denial for patients facet injection. Peer to peer number to call at your convenience is [phone]  Order ID: [id number]  Denial reason as stated below:  Your doctor told us that you have lower back pain. Your doctor wants to give you an injection in between the bones of your lower back. This injection is needed when certain criteria are met. You can have this injection if you have joint pain from inflammation in your body. You can have this injection if you should not have the procedure that heats nerve tissue. You can have this injection if you have an implanted electrical device. You can have this injection if you have pain going down your leg that is due to a cyst pressing on a nerve from your spine.  - You should have pain that has stopped you from doing some of your normal activities for at least three months.   -A physical exam by your doctor should show signs that the pain is coming from the small joints of your lower back.   -You should have pain that has not improved after completing six weeks of treatment. You need at least two types of treatment. This could include medications as well as other treatments but must include special exercises that help make muscles stronger. This is called physical therapy.   We reviewed the notes we have. The notes do not show that you meet all the required criteria. As a result, this injection is not medically necessary.

## 2022-12-01 ENCOUNTER — Other Ambulatory Visit: Payer: Self-pay | Admitting: Sports Medicine

## 2022-12-01 DIAGNOSIS — M47816 Spondylosis without myelopathy or radiculopathy, lumbar region: Secondary | ICD-10-CM

## 2022-12-01 DIAGNOSIS — G8929 Other chronic pain: Secondary | ICD-10-CM

## 2022-12-01 DIAGNOSIS — M48062 Spinal stenosis, lumbar region with neurogenic claudication: Secondary | ICD-10-CM

## 2022-12-01 NOTE — Progress Notes (Unsigned)
Referral placed to neurosurgery along with MBB

## 2022-12-01 NOTE — Telephone Encounter (Signed)
Patient is going to contact [facility] to schedule the [employer].  She also said that Neurosurgery will not see her with the MRI which will put her out until after 01/15/23.

## 2022-12-01 NOTE — Telephone Encounter (Signed)
Referral placed for MBB and neurology

## 2022-12-02 ENCOUNTER — Other Ambulatory Visit (HOSPITAL_COMMUNITY): Payer: Self-pay

## 2022-12-06 NOTE — Discharge Instructions (Signed)
Medial Branch Block Discharge Instructions  Take over-the-counter and prescription medicines only as told by your health care provider.  Do not drive the day of your procedure  Return to your normal activities as told by your health care provider.   If injection site is sore you may ice the area for 20 minutes, 2-3 times a day.   Check your injection site every day for signs of infection. Check for: Redness, swelling, or pain. Fluid or blood. Warmth. Pus or a bad smell.  Please contact our office at [phone] if: You have a fever or chills. You have any signs of infection. You develop any numbness or weakness.   Thank you for visiting our office. Medial Branch Block Discharge Instructions  Take over-the-counter and prescription medicines only as told by your health care provider.  Do not drive the day of your procedure  Return to your normal activities as told by your health care provider.   If injection site is sore you may ice the area for 20 minutes, 2-3 times a day.   Check your injection site every day for signs of infection. Check for: Redness, swelling, or pain. Fluid or blood. Warmth. Pus or a bad smell.  Please contact our office at [phone] if: You have a fever or chills. You have any signs of infection. You develop any numbness or weakness.   Thank you for visiting our office.

## 2022-12-07 ENCOUNTER — Other Ambulatory Visit: Payer: Self-pay | Admitting: Sports Medicine

## 2022-12-07 ENCOUNTER — Ambulatory Visit
Admission: RE | Admit: 2022-12-07 | Discharge: 2022-12-07 | Disposition: A | Discharge status: Home / Self Care | Payer: BC Managed Care – PPO | Source: Ambulatory Visit | Attending: Sports Medicine | Admitting: Sports Medicine

## 2022-12-07 DIAGNOSIS — M47816 Spondylosis without myelopathy or radiculopathy, lumbar region: Secondary | ICD-10-CM

## 2022-12-07 DIAGNOSIS — G8929 Other chronic pain: Secondary | ICD-10-CM

## 2022-12-07 DIAGNOSIS — M79604 Pain in right leg: Secondary | ICD-10-CM | POA: Diagnosis not present

## 2022-12-07 DIAGNOSIS — M48062 Spinal stenosis, lumbar region with neurogenic claudication: Secondary | ICD-10-CM

## 2022-12-07 DIAGNOSIS — M545 Low back pain, unspecified: Secondary | ICD-10-CM | POA: Diagnosis not present

## 2022-12-09 ENCOUNTER — Other Ambulatory Visit (HOSPITAL_COMMUNITY): Payer: Self-pay

## 2022-12-13 NOTE — Progress Notes (Unsigned)
Referring Physician:  Richardean Sale, DO [address] [place],  [place] [address]  Primary Physician:  Laqueta Due., MD  History of Present Illness: 12/14/2022 Diana Schultz is here today with a chief complaint of back and right leg pain.  Her pain has been going down her right leg and is causing substantial debility for her.  All activities make it worse.  Aleve has helped.  She gets pain into her lower back that radiates into her right buttock, anterior lateral thigh, and anterior lateral shin.  Her pain is constant.  She tried physical therapy in March, but was unable to do many of the exercises. Bowel/Bladder Dysfunction: none  Conservative measures:  Physical therapy:  denies  Multimodal medical therapy including regular antiinflammatories:  aleve, gabapentin,moxicam Injections:  12/07/22 medial branch blocks at L4/5, diagnostic (approximately 50% relief in her lower back pain )  11/10/22 right  at L4-L5 (no help)   Past Surgery: none  Diana Schultz has no symptoms of cervical myelopathy.  The symptoms are causing a significant impact on the patient's life.   I have utilized the care everywhere function in epic to review the outside records available from external health systems.  Review of Systems:  A 10 point review of systems is negative, except for the pertinent positives and negatives detailed in the HPI.  Past Medical History: Past Medical History:  Diagnosis Date   Adrenal adenoma    Anemia    Arteriovenous malformation    Depression    Diabetes mellitus without complication (HCC)    borderline - no meds   Diverticulitis    DVT (deep venous thrombosis) (HCC)    Hx at age 61 blood clot in right leg, d/c birth control pills and no other problems   Fatty liver    Gallbladder disease    Hypertension    history,lost weight, controlled with diet and exercise, no current med   Lactose intolerance    Morbid obesity (HCC)    Ovarian cyst     Prediabetes    Thalassemia    Umbilical hernia    Vaginal delivery 1984, 1989   Vitamin D deficiency     Past Surgical History: Past Surgical History:  Procedure Laterality Date   ABDOMINAL HYSTERECTOMY     ABLATION  03/23/13   [facility]   APPENDECTOMY     CHOLECYSTECTOMY     DILATION AND CURETTAGE OF UTERUS     DILATION AND CURETTAGE OF UTERUS N/A 11/01/2013   Procedure: DILATATION AND CURETTAGE;  Surgeon: Catalina Antigua, MD;  Location: [facility];  Service: Gynecology;  Laterality: N/A;   DILITATION & CURRETTAGE/HYSTROSCOPY WITH NOVASURE ABLATION N/A 03/23/2013   Procedure: DILATATION & CURETTAGE/HYSTEROSCOPY WITH NOVASURE ABLATION;  Surgeon: Willodean Rosenthal, MD;  Location: [facility];  Service: Gynecology;  Laterality: N/A;   LEEP     TUBAL LIGATION     VAGINAL HYSTERECTOMY N/A 12/31/2013   Procedure: HYSTERECTOMY VAGINAL;  Surgeon: Willodean Rosenthal, MD;  Location: [facility];  Service: Gynecology;  Laterality: N/A;    Allergies: Allergies as of 12/14/2022 - Review Complete 11/03/2022  Allergen Reaction Noted   Amlodipine Cough and Swelling 02/28/2018   Metoprolol Swelling 06/03/2017   Benicar [olmesartan] Cough 10/24/2013   Hydrochlorothiazide  06/03/2017   Lactose intolerance (gi)  09/25/2019   Lisinopril Cough 06/18/2011   Nebivolol  06/03/2017   Semaglutide(0.25 or 0.5mg -dos) Itching 09/25/2019    Medications:  Current Outpatient Medications:    furosemide (LASIX) 20 MG  tablet, Take 1 tablet (20 mg) by mouth daily., Disp: 30 tablet, Rfl: 0   potassium chloride (KLOR-CON 10) 10 MEQ tablet, Take 1 tablet (10 mEq) by mouth daily with furosemide, Disp: 30 tablet, Rfl: 0   telmisartan (MICARDIS) 80 MG tablet, Take 1 tablet (80 mg total) by mouth daily., Disp: 30 tablet, Rfl: 1   tirzepatide (MOUNJARO) 15 MG/0.5ML Pen, Inject 15 mg into the skin once a week., Disp: 2 mL, Rfl: 2  Social History: Social History   Tobacco Use   Smoking status: Never   Smokeless tobacco: Never   Vaping Use   Vaping Use: Never used  Substance Use Topics   Alcohol use: No    Comment: rare    Drug use: No    Family Medical History: Family History  Problem Relation Age of Onset   Cancer Mother    Hypertension Mother    Diabetes Mother    Stroke Mother        early in life   Endometrial cancer Mother    Hyperlipidemia Mother    Depression Mother    Mental retardation Father    Breast cancer Father    Depression Father    Hypertension Sister    Hypertension Brother    Heart failure Paternal Grandfather    CAD Other        GP, late in like   Colon cancer Neg Hx     Physical Examination: Vitals:   12/14/22 1400  BP: 138/74    General: Patient is in no apparent distress. Attention to examination is appropriate.  Neck:   Supple.  Full range of motion.  Respiratory: Patient is breathing without any difficulty.   NEUROLOGICAL:     Awake, alert, oriented to person, place, and time.  Speech is clear and fluent.   Cranial Nerves: Pupils equal round and reactive to light.  Facial tone is symmetric.  Facial sensation is symmetric. Shoulder shrug is symmetric. Tongue protrusion is midline.  There is no pronator drift.  Strength: Side Biceps Triceps Deltoid Interossei Grip Wrist Ext. Wrist Flex.  R 5 5 5 5 5 5 5   L 5 5 5 5 5 5 5    Side Iliopsoas Quads Hamstring PF DF EHL  R 5 5 5 5 5 5   L 5 5 5 5 5 5    Reflexes are 1+ and symmetric at the biceps, triceps, brachioradialis, patella and achilles.   Hoffman's is absent.   Bilateral upper and lower extremity sensation is intact to light touch.    No evidence of dysmetria noted.  Gait is antalgic.  SLR positive at 30 degrees     Medical Decision Making  Imaging: CT L spine 10/28/2022 IMPRESSION: 1. Severe lower lumbar facet arthrosis with grade 1 anterolisthesis at L4 and L5 L4-5. 2. Potentially symptomatic right greater than left lateral recess and neural foraminal stenosis at L4-5. 3. Moderate neural  foraminal stenosis at L3-4 and L5-S1.     Electronically Signed   By: Sebastian Ache M.D.   On: 11/02/2022 12:41  I have personally reviewed the images and agree with the above interpretation.  Assessment and Plan: Diana Schultz is a pleasant 57 y.o. female with chronic back pain with right-sided sciatica.  She is having severe pain down her right leg.  I have reviewed her CT scan that is current in the once in the past.  She has severe degenerative changes at L4-5 and L5-S1.  Her L5-S1 facet joints appear to be  fused.  She also has small amount of spondylolisthesis of L4 and L5.  She may have a disc herniation at that level.  She is getting an MRI scan next month.  I would like to reevaluate her after that.  We discussed the utility of a L4-5 radiofrequency ablation.  I think this may help with her back pain.  I am certainly in favor of her considering moving forward with this.  We discussed that she is only been to physical therapy for 1 visit.  I would like her to be reevaluated by physical therapy.  I will see her back in clinic in the first week of August.    Thank you for involving me in the care of this patient.      Coty Student K. Myer Haff MD, [facility] Neurosurgery

## 2022-12-14 ENCOUNTER — Ambulatory Visit (INDEPENDENT_AMBULATORY_CARE_PROVIDER_SITE_OTHER): Payer: BC Managed Care – PPO | Admitting: Neurosurgery

## 2022-12-14 ENCOUNTER — Encounter: Payer: Self-pay | Admitting: Neurosurgery

## 2022-12-14 ENCOUNTER — Other Ambulatory Visit (HOSPITAL_COMMUNITY): Payer: Self-pay

## 2022-12-14 VITALS — BP 138/74 | Ht 63.0 in | Wt 248.2 lb

## 2022-12-14 DIAGNOSIS — M4316 Spondylolisthesis, lumbar region: Secondary | ICD-10-CM | POA: Diagnosis not present

## 2022-12-14 DIAGNOSIS — M5441 Lumbago with sciatica, right side: Secondary | ICD-10-CM

## 2022-12-14 DIAGNOSIS — G8929 Other chronic pain: Secondary | ICD-10-CM

## 2022-12-20 ENCOUNTER — Other Ambulatory Visit (HOSPITAL_COMMUNITY): Payer: Self-pay

## 2022-12-20 DIAGNOSIS — E1169 Type 2 diabetes mellitus with other specified complication: Secondary | ICD-10-CM | POA: Diagnosis not present

## 2022-12-20 DIAGNOSIS — R638 Other symptoms and signs concerning food and fluid intake: Secondary | ICD-10-CM | POA: Diagnosis not present

## 2022-12-20 DIAGNOSIS — G8929 Other chronic pain: Secondary | ICD-10-CM | POA: Diagnosis not present

## 2022-12-20 DIAGNOSIS — Z9189 Other specified personal risk factors, not elsewhere classified: Secondary | ICD-10-CM | POA: Diagnosis not present

## 2022-12-20 DIAGNOSIS — I152 Hypertension secondary to endocrine disorders: Secondary | ICD-10-CM | POA: Diagnosis not present

## 2022-12-20 MED ORDER — POTASSIUM CHLORIDE ER 10 MEQ PO TBCR
10.0000 meq | EXTENDED_RELEASE_TABLET | Freq: Every day | ORAL | 3 refills | Status: DC
  Filled 2022-12-20: qty 30, 30d supply, fill #0

## 2022-12-20 MED ORDER — FUROSEMIDE 20 MG PO TABS
20.0000 mg | ORAL_TABLET | Freq: Every day | ORAL | 3 refills | Status: DC
  Filled 2022-12-20: qty 30, 30d supply, fill #0

## 2022-12-20 MED ORDER — NALTREXONE HCL 50 MG PO TABS
50.0000 mg | ORAL_TABLET | Freq: Every day | ORAL | 0 refills | Status: DC
  Filled 2022-12-20: qty 15, 15d supply, fill #0

## 2022-12-20 MED ORDER — TELMISARTAN 80 MG PO TABS
80.0000 mg | ORAL_TABLET | Freq: Every day | ORAL | 3 refills | Status: DC
  Filled 2022-12-20: qty 30, 30d supply, fill #0

## 2022-12-21 ENCOUNTER — Telehealth: Payer: Self-pay | Admitting: Sports Medicine

## 2022-12-21 ENCOUNTER — Telehealth: Payer: Self-pay

## 2022-12-21 ENCOUNTER — Other Ambulatory Visit (HOSPITAL_COMMUNITY): Payer: Self-pay

## 2022-12-21 DIAGNOSIS — M4316 Spondylolisthesis, lumbar region: Secondary | ICD-10-CM

## 2022-12-21 DIAGNOSIS — M5387 Other specified dorsopathies, lumbosacral region: Secondary | ICD-10-CM | POA: Diagnosis not present

## 2022-12-21 DIAGNOSIS — M9905 Segmental and somatic dysfunction of pelvic region: Secondary | ICD-10-CM | POA: Diagnosis not present

## 2022-12-21 DIAGNOSIS — M5441 Lumbago with sciatica, right side: Secondary | ICD-10-CM | POA: Diagnosis not present

## 2022-12-21 DIAGNOSIS — M9903 Segmental and somatic dysfunction of lumbar region: Secondary | ICD-10-CM | POA: Diagnosis not present

## 2022-12-21 DIAGNOSIS — G8929 Other chronic pain: Secondary | ICD-10-CM

## 2022-12-21 NOTE — Telephone Encounter (Signed)
Pt had appt with Dr. Arvilla Market surgeon 6/25. Per pt, Dr. Myer Haff was going to contact Dr. Jean Rosenthal to order an ablation.  Has this contact/order occurred?

## 2022-12-21 NOTE — Telephone Encounter (Signed)
Dr. Jean Rosenthal received a message from a Dr. Marcell Barlow and he will be taking over care and ordering any injections from here on out

## 2022-12-21 NOTE — Telephone Encounter (Signed)
-----   Message from Diana Schultz sent at 12/21/2022  2:39 PM EDT ----- She is going to let Diana Schultz's office order it. The order from 6/12 was declined by her insurance but now that she seen Diana.Y maybe it will be approved. She will contact them back. ----- Message ----- From: Diana Gowda, RN Sent: 12/21/2022   2:20 PM EDT To: Diana Schultz  Diana  Diana Schultz ordered it on 6/12. Diana Schultz did send him a message. Diana Schultz can re-order it for [facility] since she lives in [facility]. ----- Message ----- From: Diana Schultz Sent: 12/21/2022  10:44 AM EDT To: Diana Night, MD  Patient calling, have you contacted Diana Schultz so he can move forward with ordering the patient an ablation? Patient understood that you were going to speak with Diana Schultz regarding her appt.  12/14/2022 Assessment and Plan: Diana Schultz is a pleasant 57 y.o. female with chronic back pain with right-sided sciatica.  She is having severe pain down her right leg.  I have reviewed her CT scan that is current in the once in the past.  She has severe degenerative changes at L4-5 and L5-S1.  Her L5-S1 facet joints appear to be fused.  She also has small amount of spondylolisthesis of L4 and L5.  She may have a disc herniation at that level.   She is getting an MRI scan next month.  I would like to reevaluate her after that.  We discussed the utility of a L4-5 radiofrequency ablation.  I think this may help with her back pain.  I am certainly in favor of her considering moving forward with this.   We discussed that she is only been to physical therapy for 1 visit.  I would like her to be reevaluated by physical therapy.  I will see her back in clinic in the first week of August.

## 2022-12-21 NOTE — Telephone Encounter (Signed)
Dr Jeannie Fend said we will take care of ordering the injection. Order has been placed. They should call her to schedule. If she doesn't hear from them soon, she can call them to check on the status [phone]).

## 2022-12-21 NOTE — Telephone Encounter (Signed)
I have no clue what patient is talking about. I have not seen anything come in from that Drs. Office

## 2022-12-21 NOTE — Telephone Encounter (Signed)
I will call patient and let her know that her neurosurgery needs to contact [facility]

## 2022-12-21 NOTE — Telephone Encounter (Signed)
Ablation was already ordered and put in by [facility]

## 2022-12-22 NOTE — Telephone Encounter (Signed)
We ordered an ablation

## 2022-12-22 NOTE — Telephone Encounter (Signed)
Is the injection the same thing has the ablation? She wants an ablation because she already had an injection.

## 2022-12-22 NOTE — Telephone Encounter (Signed)
Patient notified and she will followup with [facility].

## 2022-12-24 DIAGNOSIS — M9905 Segmental and somatic dysfunction of pelvic region: Secondary | ICD-10-CM | POA: Diagnosis not present

## 2022-12-24 DIAGNOSIS — M9906 Segmental and somatic dysfunction of lower extremity: Secondary | ICD-10-CM | POA: Diagnosis not present

## 2022-12-24 DIAGNOSIS — M5441 Lumbago with sciatica, right side: Secondary | ICD-10-CM | POA: Diagnosis not present

## 2022-12-24 DIAGNOSIS — M9903 Segmental and somatic dysfunction of lumbar region: Secondary | ICD-10-CM | POA: Diagnosis not present

## 2022-12-24 DIAGNOSIS — M5387 Other specified dorsopathies, lumbosacral region: Secondary | ICD-10-CM | POA: Diagnosis not present

## 2022-12-27 DIAGNOSIS — M9905 Segmental and somatic dysfunction of pelvic region: Secondary | ICD-10-CM | POA: Diagnosis not present

## 2022-12-27 DIAGNOSIS — M9903 Segmental and somatic dysfunction of lumbar region: Secondary | ICD-10-CM | POA: Diagnosis not present

## 2022-12-27 DIAGNOSIS — M5387 Other specified dorsopathies, lumbosacral region: Secondary | ICD-10-CM | POA: Diagnosis not present

## 2022-12-27 DIAGNOSIS — M9906 Segmental and somatic dysfunction of lower extremity: Secondary | ICD-10-CM | POA: Diagnosis not present

## 2022-12-27 DIAGNOSIS — M5441 Lumbago with sciatica, right side: Secondary | ICD-10-CM | POA: Diagnosis not present

## 2022-12-28 ENCOUNTER — Encounter: Payer: Self-pay | Admitting: Rehabilitative and Restorative Service Providers"

## 2022-12-28 ENCOUNTER — Ambulatory Visit: Payer: BC Managed Care – PPO | Attending: Neurosurgery | Admitting: Rehabilitative and Restorative Service Providers"

## 2022-12-28 ENCOUNTER — Other Ambulatory Visit: Payer: Self-pay

## 2022-12-28 DIAGNOSIS — M4316 Spondylolisthesis, lumbar region: Secondary | ICD-10-CM | POA: Insufficient documentation

## 2022-12-28 DIAGNOSIS — M5441 Lumbago with sciatica, right side: Secondary | ICD-10-CM | POA: Insufficient documentation

## 2022-12-28 DIAGNOSIS — G8929 Other chronic pain: Secondary | ICD-10-CM | POA: Insufficient documentation

## 2022-12-28 DIAGNOSIS — M5459 Other low back pain: Secondary | ICD-10-CM | POA: Diagnosis not present

## 2022-12-28 DIAGNOSIS — Z6841 Body Mass Index (BMI) 40.0 and over, adult: Secondary | ICD-10-CM | POA: Diagnosis not present

## 2022-12-28 DIAGNOSIS — M6281 Muscle weakness (generalized): Secondary | ICD-10-CM | POA: Insufficient documentation

## 2022-12-28 DIAGNOSIS — E611 Iron deficiency: Secondary | ICD-10-CM | POA: Diagnosis not present

## 2022-12-28 DIAGNOSIS — R252 Cramp and spasm: Secondary | ICD-10-CM | POA: Insufficient documentation

## 2022-12-28 DIAGNOSIS — R2689 Other abnormalities of gait and mobility: Secondary | ICD-10-CM | POA: Diagnosis not present

## 2022-12-28 DIAGNOSIS — R718 Other abnormality of red blood cells: Secondary | ICD-10-CM | POA: Diagnosis not present

## 2022-12-28 NOTE — Therapy (Signed)
OUTPATIENT PHYSICAL THERAPY THORACOLUMBAR EVALUATION   Patient Name: Diana Schultz MRN: [id number] DOB:June 01, 1966, 57 y.o., female Today's Date: 12/28/2022  END OF SESSION:  PT End of Session - 12/28/22 0855     Visit Number 1    Authorization Type BC/BS    PT Start Time 0847    PT Stop Time 0925    PT Time Calculation (min) 38 min    Activity Tolerance Patient tolerated treatment well    Behavior During Therapy WFL for tasks assessed/performed             Past Medical History:  Diagnosis Date   Adrenal adenoma    Anemia    Arteriovenous malformation    Depression    Diabetes mellitus without complication (HCC)    borderline - no meds   Diverticulitis    DVT (deep venous thrombosis) (HCC)    Hx at age 36 blood clot in right leg, d/c birth control pills and no other problems   Fatty liver    Gallbladder disease    Hypertension    history,lost weight, controlled with diet and exercise, no current med   Lactose intolerance    Morbid obesity (HCC)    Ovarian cyst    Prediabetes    Thalassemia    Umbilical hernia    Vaginal delivery 1984, 1989   Vitamin D deficiency    Past Surgical History:  Procedure Laterality Date   ABDOMINAL HYSTERECTOMY     ABLATION  03/23/13   [facility]   APPENDECTOMY     CHOLECYSTECTOMY     DILATION AND CURETTAGE OF UTERUS     DILATION AND CURETTAGE OF UTERUS N/A 11/01/2013   Procedure: DILATATION AND CURETTAGE;  Surgeon: Catalina Antigua, MD;  Location: [facility];  Service: Gynecology;  Laterality: N/A;   DILITATION & CURRETTAGE/HYSTROSCOPY WITH NOVASURE ABLATION N/A 03/23/2013   Procedure: DILATATION & CURETTAGE/HYSTEROSCOPY WITH NOVASURE ABLATION;  Surgeon: Willodean Rosenthal, MD;  Location: [facility];  Service: Gynecology;  Laterality: N/A;   LEEP     TUBAL LIGATION     VAGINAL HYSTERECTOMY N/A 12/31/2013   Procedure: HYSTERECTOMY VAGINAL;  Surgeon: Willodean Rosenthal, MD;  Location: [facility];  Service: Gynecology;  Laterality: N/A;   Patient  Active Problem List   Diagnosis Date Noted   Renovascular hypertension 09/22/2021   Class 3 obesity with alveolar hypoventilation, serious comorbidity, and body mass index (BMI) of 50.0 to 59.9 in adult (HCC) 09/22/2021   Excessive daytime sleepiness 09/22/2021   Nocturia more than twice per night 09/22/2021   Snoring 09/22/2021   History of DVT of lower extremity 08/04/2020   Postmenopausal 09/11/2019   History of cholecystectomy 09/11/2019   Osteoarthritis of left hip 09/11/2019   Pulmonary arteriovenous fistula (HCC) 03/24/2018   Prediabetes 02/14/2018   Adrenal adenoma, left 12/16/2017   Chronic RLQ pain 04/20/2017   Deep dyspareunia 04/20/2017   Morbid obesity (HCC) 03/15/2017   Vitamin D deficiency 03/15/2017   Essential hypertension 08/29/2013    PCP: Laqueta Due, MD  REFERRING PROVIDER: Venetia Night, MD  REFERRING DIAG: [phone] (ICD-10-CM) - Chronic bilateral low back pain with right-sided sciatica M43.16 (ICD-10-CM) - Spondylolisthesis of lumbar region  Rationale for Evaluation and Treatment: Rehabilitation  THERAPY DIAG:  Other low back pain - Plan: PT plan of care cert/re-cert  Cramp and spasm - Plan: PT plan of care cert/re-cert  Muscle weakness (generalized) - Plan: PT plan of care cert/re-cert  Other abnormalities of gait and mobility - Plan: PT plan of  care cert/re-cert  ONSET DATE: February 2024  SUBJECTIVE:                                                                                                                                                                                           SUBJECTIVE STATEMENT: Pt reports that years ago, 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  Pt reports that pain began in February 2024, and has progressively gotten worse since then.   Pt goes to see a chiropractor 2 days per week.  States that the chiropractor did dry needling with e-stim yesterday.  Pt present with a friend, Talbert Forest during session.  PERTINENT HISTORY:  Adrenal incidentaloma, HTN, anemia, pre-diabeties  PAIN:  Are you having pain? Yes: NPRS scale: 6-10/10 Pain location: low back radiating to right leg down to knee Pain description: sharp Aggravating factors: walking, movement Relieving factors: hot shower  PRECAUTIONS: None  WEIGHT BEARING RESTRICTIONS: No  FALLS:  Has patient fallen in last 6 months? No  LIVING ENVIRONMENT: Lives with: lives with their spouse Lives in: House/apartment Stairs: Yes: Internal: 12 steps; on right going up Has following equipment at home: None  OCCUPATION: Previously worked as a Lawyer  PLOF: Independent and Leisure: walking 2 shih-tzus  PATIENT GOALS: To decrease pain.  NEXT MD VISIT: Lumbar MRI on 01/15/2023  OBJECTIVE:   DIAGNOSTIC FINDINGS:  Lumbar CT on 10/28/2022 IMPRESSION: 1. Severe lower lumbar facet arthrosis with grade 1 anterolisthesis at L4 and L5 L4-5. 2. Potentially symptomatic right greater than left lateral recess and neural foraminal stenosis at L4-5. 3. Moderate neural foraminal stenosis at L3-4 and L5-S1.  PATIENT SURVEYS:  Eval:  FOTO 22 (projected 43 by visit 14)  SCREENING FOR RED FLAGS: Bowel or bladder incontinence: No Spinal tumors: No Cauda equina syndrome: No Compression fracture: No Abdominal aneurysm: No  COGNITION: Overall cognitive status: Within functional limits for tasks assessed     SENSATION: WFL  MUSCLE LENGTH: Muscle restrictions present  Could not extend knees to end range in seated position   POSTURE: rounded shoulders, forward head, decreased lumbar lordosis, posterior pelvic tilt, and weight shift left  PALPATION: Patient had tenderness along medial aspect of right knee that triggered concordant pain to back (L3) , tenderness on right lateral  hip, right glute medius   LUMBAR ROM:   12/28/2022: Flexion 24 degrees with pain Extension 10 degrees with sharp pain and wincing Limited at  least 75% with side bending with increased pain noted to right side, some pain to left side (reports more of SI joint pain region)  LOWER EXTREMITY ROM:    Global restrictions limited by 50%  LOWER EXTREMITY MMT:   12/28/2022:   Hip flexion: R 4-/5 limited by pain, L unable to perform limited by pain  Manual muscle testing terminated due to pain   LUMBAR SPECIAL TESTS:  Trendelenburg sign: Positive on left side  FUNCTIONAL TESTS:  12/28/2022: Timed up and go (TUG): 22.63 sec 30 seconds chair stand test:  2 stands  GAIT: Distance walked: 72ft Assistive device utilized: None Level of assistance: Modified independence Comments: Patient relied on contacting objects around her while ambulating (following along the wall) for stability, antalgic gait, trunk lean to left, right hip hike to clear right foot off ground   TODAY'S TREATMENT:                                                                                                                              DATE: 12/28/2022 Review of HEP (see below)  Reviewed aquatic PT and dry needling, provided with printed handouts   PATIENT EDUCATION:  Education details: Issued HEP Person educated: Patient Education method: Explanation, Demonstration, and Handouts Education comprehension: verbalized understanding and returned demonstration  HOME EXERCISE PROGRAM: Access Code: [email or web address] URL: [email or web address] Date: 12/28/2022 Prepared by: Clydie Braun Glennie Bose  Exercises - Standing Transverse Abdominis Contraction  - 1 x daily - 7 x weekly - 2 sets - 10 reps - Seated Hamstring Stretch  - 1 x daily - 7 x weekly - 1 sets - 2 reps - 20 sec hold - Supine Transversus Abdominis Bracing - Hands on Thighs  - 1 x daily - 7 x weekly - 2 sets - 10 reps - Supine Lower Trunk Rotation  - 1 x daily - 7 x  weekly - 1 sets - 10 reps - Supine Piriformis Stretch with Foot on Ground  - 1 x daily - 7 x weekly - 1 sets - 2 reps - 20 sec hold  ASSESSMENT:  CLINICAL IMPRESSION: Patient is a 57 y.o. female who was seen today for physical therapy evaluation and treatment for chronic low back pain with right-sided sciatica and prior grade 1 lumbar spondylolisthesis. Patient presented with 10/10 concordant pain that was easily reproduced via global lumbar AROM, palpation, and ambulation suggesting musculoskeletal system origin. Right lateral hip and thigh pain are likely referral pain patterns from the lumbar spine as the patient's end range lumbar motion reproduced a "catching sharp pain" indicative of potential vertebral disc involvement. Likewise, palpation along the L3 distribution of the right medial knee reproduced pain in the low back further suggesting involvement at the lumbar spine. Due to the high irritability and intensity of pain, the patient demonstrated several compensatory mechanisms in an effort to offload the right hip in order to decrease pain resulting in an antalgic gait, right hip hike, and left trunk  likely perpetuating the low back pain with overactive erector spinae and glute med muscles. Likewise, the patient admitted to making several lifestyle changes due to the intensity of the pain that included avoiding stair navigation within the household due to fear of falling, hiring a housekeeper to clean her home, relying on husband to reach for things above her head, and no longer able to walk her dog. Patient would benefit from skilled physical therapy interventions to address limitations in sleeping, mobility, and strength in order to improve participation in work related requirements and return to previous community activity level.   OBJECTIVE IMPAIRMENTS: Abnormal gait, decreased activity tolerance, decreased balance, decreased coordination, decreased endurance, decreased mobility, difficulty  walking, decreased ROM, decreased strength, impaired flexibility, improper body mechanics, postural dysfunction, and pain.   ACTIVITY LIMITATIONS: carrying, lifting, bending, sitting, standing, squatting, sleeping, stairs, transfers, bed mobility, bathing, toileting, dressing, reach over head, locomotion level, and caring for others  PARTICIPATION LIMITATIONS: cleaning, laundry, interpersonal relationship, driving, community activity, and occupation  PERSONAL FACTORS: Age, Fitness, Profession, Time since onset of injury/illness/exacerbation, and 3+ comorbidities: HTN, pre-diabetes, lumbar spondylolisthesis  are also affecting patient's functional outcome.   REHAB POTENTIAL: Good  CLINICAL DECISION MAKING: Evolving/moderate complexity  EVALUATION COMPLEXITY: Moderate   GOALS: Goals reviewed with patient? Yes  SHORT TERM GOALS: Target date: 01/21/2023  Pt will be independent with initial HEP. Baseline: Goal status: INITIAL  2.  Patient will report at least a 25% improvement in symptoms with ADLs. Baseline:  Goal status: INITIAL  3. Patient will demonstrate the proper log roll technique to protect the back from unnecessary spinal compression that could reproduce low back pain.   Baseline: patient reported intense pain with bed mobility   Goal status: INITIAL      LONG TERM GOALS: Target date: 02/18/2023  Pt will be independent with advanced HEP to allow for self-progression following discharge. Baseline:  Goal status: INITIAL  2.  Patient will increase FOTO to at least 43 to demonstrate improvements with functional tasks. Baseline: 22% Goal status: INITIAL  3.  Patient will increase 30 sec sit to stand reps to 10 reps in order to perform work related duties requiring rising and lowering into a chair frequently.  Baseline: 2 reps Goal status: INITIAL  4.  Patient will decrease TUG time to no greater than 11 secs to no longer be at an increased likelihood of falls.   Baseline: 22.63 sec Goal status: INITIAL  5.  Patient will demonstrate proper body biomechanics with lifting an object off the floor for decreased risk of LBP reproduction during work related activities.  Baseline:  Goal status: INITIAL  6.  Patient will demonstrate no increased pain with driving more than 30mins required for travel to and from clients for work.  Baseline:  Goal status: INITIAL  PLAN:  PT FREQUENCY: 2x/week  PT DURATION: 8 weeks  PLANNED INTERVENTIONS: Therapeutic exercises, Therapeutic activity, Neuromuscular re-education, Balance training, Gait training, Patient/Family education, Self Care, Joint mobilization, Joint manipulation, Stair training, Aquatic Therapy, Dry Needling, Electrical stimulation, Spinal manipulation, Spinal mobilization, Cryotherapy, Moist heat, Taping, Traction, Ultrasound, Ionotophoresis 4mg /ml Dexamethasone, Manual therapy, and Re-evaluation.  PLAN FOR NEXT SESSION: Assess and progress HEP as appropriate, core stability, strengthening, flexibility. Plan to follow up about aquatic therapy participation and potential dry needling session on low back and hip.   Ailene Ards, SPT Reather Laurence, PT, DPT 12/28/2022, 10:24 AM   [facility] [address] [place], [place] [address] Phone # [phone] Fax  [phone]  

## 2022-12-28 NOTE — Patient Instructions (Addendum)
   [facility] Welcome to [facility]! Here you will find all the information you will need regarding your pool therapy. If you have further questions at any time, please call our office at [phone]. After completing your initial evaluation in the [facility], you may be eligible to complete a portion of your therapy in the pool. A typical week of therapy will consist of 1-2 typical physical therapy visits at our [facility] location and an additional session of therapy in the pool located at the [facility] [address]. [address], [place] [address]. The phone number at the pool site is [phone]. Please call this number if you are running late or need to cancel your appointment.  Aquatic therapy will be offered on Wednesday mornings and Friday afternoons. Each session will last approximately 45 minutes. All scheduling and payments for aquatic therapy sessions, including cancelations, will be done through our [facility] location.  To be eligible for aquatic therapy, these criteria must be met: You must be able to independently change in the locker room and get to the pool deck. A caregiver can come with you to help if needed. There are benches for a caregiver to sit on next to the pool. No one with an open wound is permitted in the pool.  Handicap parking is available in the front and there is a drop off option for even closer accessibility. Please arrive 15 minutes prior to your appointment to prepare for your pool session. You must sign in at the front desk upon your arrival. Please be sure to attend to any toileting needs prior to entering the pool. Locker rooms for changing are available.  There is direct access to the pool deck from the locker room. You can lock your belongings in a locker or bring them with you poolside. Your therapist will greet you on the pool deck. There may be other swimmers in the pool at the  same time but your session is one-on-one with the therapist.    Trigger Point Dry Needling  What is Trigger Point Dry Needling (DN)? DN is a physical therapy technique used to treat muscle pain and dysfunction. Specifically, DN helps deactivate muscle trigger points (muscle knots).  A thin filiform needle is used to penetrate the skin and stimulate the underlying trigger point. The goal is for a local twitch response (LTR) to occur and for the trigger point to relax. No medication of any kind is injected during the procedure.   What Does Trigger Point Dry Needling Feel Like?  The procedure feels different for each individual patient. Some patients report that they do not actually feel the needle enter the skin and overall the process is not painful. Very mild bleeding may occur. However, many patients feel a deep cramping in the muscle in which the needle was inserted. This is the local twitch response.   How Will I feel after the treatment? Soreness is normal, and the onset of soreness may not occur for a few hours. Typically this soreness does not last longer than two days.  Bruising is uncommon, however; ice can be used to decrease any possible bruising.  In rare cases feeling tired or nauseous after the treatment is normal. In addition, your symptoms may get worse before they get better, this period will typically not last longer than 24 hours.   What Can I do After My Treatment? Increase your hydration by drinking more water for the next 24 hours.   You may place ice or heat on the areas treated that have become sore, however, do not use heat on inflamed or bruised areas. Heat often brings more relief post needling. You can continue your regular activities, but vigorous activity is not recommended initially after the treatment for 24 hours. DN is best combined with other physical therapy such as strengthening, stretching, and other therapies.  [facility]  [address] [place] [address].  [phone]   

## 2022-12-29 ENCOUNTER — Telehealth: Payer: Self-pay | Admitting: Neurosurgery

## 2022-12-29 DIAGNOSIS — M9903 Segmental and somatic dysfunction of lumbar region: Secondary | ICD-10-CM | POA: Diagnosis not present

## 2022-12-29 DIAGNOSIS — M5441 Lumbago with sciatica, right side: Secondary | ICD-10-CM | POA: Diagnosis not present

## 2022-12-29 DIAGNOSIS — M5387 Other specified dorsopathies, lumbosacral region: Secondary | ICD-10-CM | POA: Diagnosis not present

## 2022-12-29 DIAGNOSIS — M4316 Spondylolisthesis, lumbar region: Secondary | ICD-10-CM

## 2022-12-29 DIAGNOSIS — G8929 Other chronic pain: Secondary | ICD-10-CM

## 2022-12-29 DIAGNOSIS — M9905 Segmental and somatic dysfunction of pelvic region: Secondary | ICD-10-CM | POA: Diagnosis not present

## 2022-12-29 NOTE — Telephone Encounter (Signed)
I placed the orders for another bilateral L4-5 MBB (based on the same orders that were in her chart from the first MBB that she had).

## 2022-12-29 NOTE — Telephone Encounter (Signed)
[facility] called and said that Insurance says she needs a second medial branch block and will not approve the radiofrequency ablation  without it.

## 2022-12-30 ENCOUNTER — Encounter (HOSPITAL_BASED_OUTPATIENT_CLINIC_OR_DEPARTMENT_OTHER): Payer: Self-pay | Admitting: Physical Therapy

## 2022-12-30 ENCOUNTER — Encounter (HOSPITAL_BASED_OUTPATIENT_CLINIC_OR_DEPARTMENT_OTHER): Payer: BC Managed Care – PPO | Attending: Neurosurgery | Admitting: Physical Therapy

## 2022-12-30 DIAGNOSIS — M5459 Other low back pain: Secondary | ICD-10-CM | POA: Insufficient documentation

## 2022-12-30 DIAGNOSIS — R2689 Other abnormalities of gait and mobility: Secondary | ICD-10-CM | POA: Diagnosis not present

## 2022-12-30 DIAGNOSIS — R252 Cramp and spasm: Secondary | ICD-10-CM | POA: Insufficient documentation

## 2022-12-30 DIAGNOSIS — M6281 Muscle weakness (generalized): Secondary | ICD-10-CM | POA: Diagnosis not present

## 2022-12-30 NOTE — Therapy (Signed)
OUTPATIENT PHYSICAL THERAPY THORACOLUMBAR TREATMENT   Patient Name: Diana Schultz MRN: [id number] DOB:1966/01/30, 57 y.o., female Today's Date: 12/30/2022  END OF SESSION:  PT End of Session - 12/30/22 1213     Visit Number 2    Authorization Type BC/BS    PT Start Time 1202    PT Stop Time 1240    PT Time Calculation (min) 38 min    Activity Tolerance Patient limited by pain    Behavior During Therapy WFL for tasks assessed/performed             Past Medical History:  Diagnosis Date   Adrenal adenoma    Anemia    Arteriovenous malformation    Depression    Diabetes mellitus without complication (HCC)    borderline - no meds   Diverticulitis    DVT (deep venous thrombosis) (HCC)    Hx at age 71 blood clot in right leg, d/c birth control pills and no other problems   Fatty liver    Gallbladder disease    Hypertension    history,lost weight, controlled with diet and exercise, no current med   Lactose intolerance    Morbid obesity (HCC)    Ovarian cyst    Prediabetes    Thalassemia    Umbilical hernia    Vaginal delivery 1984, 1989   Vitamin D deficiency    Past Surgical History:  Procedure Laterality Date   ABDOMINAL HYSTERECTOMY     ABLATION  03/23/13   [facility]   APPENDECTOMY     CHOLECYSTECTOMY     DILATION AND CURETTAGE OF UTERUS     DILATION AND CURETTAGE OF UTERUS N/A 11/01/2013   Procedure: DILATATION AND CURETTAGE;  Surgeon: Catalina Antigua, MD;  Location: [facility];  Service: Gynecology;  Laterality: N/A;   DILITATION & CURRETTAGE/HYSTROSCOPY WITH NOVASURE ABLATION N/A 03/23/2013   Procedure: DILATATION & CURETTAGE/HYSTEROSCOPY WITH NOVASURE ABLATION;  Surgeon: Willodean Rosenthal, MD;  Location: [facility];  Service: Gynecology;  Laterality: N/A;   LEEP     TUBAL LIGATION     VAGINAL HYSTERECTOMY N/A 12/31/2013   Procedure: HYSTERECTOMY VAGINAL;  Surgeon: Willodean Rosenthal, MD;  Location: [facility];  Service: Gynecology;  Laterality: N/A;   Patient Active  Problem List   Diagnosis Date Noted   Renovascular hypertension 09/22/2021   Class 3 obesity with alveolar hypoventilation, serious comorbidity, and body mass index (BMI) of 50.0 to 59.9 in adult (HCC) 09/22/2021   Excessive daytime sleepiness 09/22/2021   Nocturia more than twice per night 09/22/2021   Snoring 09/22/2021   History of DVT of lower extremity 08/04/2020   Postmenopausal 09/11/2019   History of cholecystectomy 09/11/2019   Osteoarthritis of left hip 09/11/2019   Pulmonary arteriovenous fistula (HCC) 03/24/2018   Prediabetes 02/14/2018   Adrenal adenoma, left 12/16/2017   Chronic RLQ pain 04/20/2017   Deep dyspareunia 04/20/2017   Morbid obesity (HCC) 03/15/2017   Vitamin D deficiency 03/15/2017   Essential hypertension 08/29/2013    PCP: Laqueta Due, MD  REFERRING PROVIDER: Venetia Night, MD  REFERRING DIAG: [phone] (ICD-10-CM) - Chronic bilateral low back pain with right-sided sciatica M43.16 (ICD-10-CM) - Spondylolisthesis of lumbar region  Rationale for Evaluation and Treatment: Rehabilitation  THERAPY DIAG:  Other low back pain  Cramp and spasm  Muscle weakness (generalized)  Other abnormalities of gait and mobility  ONSET DATE: February 2024  SUBJECTIVE:  SUBJECTIVE STATEMENT: Pt reports she has been having spasms in her RLE.  She states she is not afraid of water, but hasn't been in pool in 5+ years and doesn't know how to swim.     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  Pt reports that pain began in February 2024, and has progressively  gotten worse since then.  Pt goes to see a chiropractor 2 days per week.  States that the chiropractor did dry needling with e-stim yesterday.  Pt present with a friend, Talbert Forest during session.  PERTINENT HISTORY:  Adrenal incidentaloma, HTN, anemia, pre-diabeties  PAIN:  Are you having pain? Yes: NPRS scale: 10/10 Pain location: low back radiating to right leg down to lateral knee Pain description: sharp Aggravating factors: walking, movement Relieving factors: hot shower  PRECAUTIONS: None  WEIGHT BEARING RESTRICTIONS: No  FALLS:  Has patient fallen in last 6 months? No  LIVING ENVIRONMENT: Lives with: lives with their spouse Lives in: House/apartment Stairs: Yes: Internal: 12 steps; on right going up Has following equipment at home: None  OCCUPATION: Previously worked as a Lawyer  PLOF: Independent and Leisure: walking 2 shih-tzus  PATIENT GOALS: To decrease pain.  NEXT MD VISIT: Lumbar MRI on 01/15/2023  OBJECTIVE:   DIAGNOSTIC FINDINGS:  Lumbar CT on 10/28/2022 IMPRESSION: 1. Severe lower lumbar facet arthrosis with grade 1 anterolisthesis at L4 and L5 L4-5. 2. Potentially symptomatic right greater than left lateral recess and neural foraminal stenosis at L4-5. 3. Moderate neural foraminal stenosis at L3-4 and L5-S1.  PATIENT SURVEYS:  Eval:  FOTO 22 (projected 51 by visit 14)  SCREENING FOR RED FLAGS: Bowel or bladder incontinence: No Spinal tumors: No Cauda equina syndrome: No Compression fracture: No Abdominal aneurysm: No  COGNITION: Overall cognitive status: Within functional limits for tasks assessed     SENSATION: WFL  MUSCLE LENGTH: Muscle restrictions present  Could not extend knees to end range in seated position   POSTURE: rounded shoulders, forward head, decreased lumbar lordosis, posterior pelvic tilt, and weight shift left  PALPATION: Patient had tenderness along medial aspect of right knee that triggered concordant pain to back (L3) ,  tenderness on right lateral hip, right glute medius   LUMBAR ROM:   12/28/2022: Flexion 24 degrees with pain Extension 10 degrees with sharp pain and wincing Limited at least 75% with side bending with increased pain noted to right side, some pain to left side (reports more of SI joint pain region)  LOWER EXTREMITY ROM:    Global restrictions limited by 50%  LOWER EXTREMITY MMT:   12/28/2022:   Hip flexion: R 4-/5 limited by pain, L unable to perform limited by pain  Manual muscle testing terminated due to pain   LUMBAR SPECIAL TESTS:  Trendelenburg sign: Positive on left side  FUNCTIONAL TESTS:  12/28/2022: Timed up and go (TUG): 22.63 sec 30 seconds chair stand test:  2 stands  GAIT: Distance walked: 69ft Assistive device utilized: None Level of assistance: Modified independence Comments: Patient relied on contacting objects around her while ambulating (following along the wall) for stability, antalgic gait, trunk lean to left, right hip hike to clear right foot off ground   TODAY'S TREATMENT:  DATE: 12/30/2022 Pt seen for aquatic therapy today.  Treatment took place in water 3.5-4.75 ft in depth at the [facility] pool. Temp of water was 91.  Pt entered/exited the pool via stairs independently in step-to pattern with heavy bilat rail. * intro to aquatic therapy concepts * UE support on barbell, walking next to wall:  multiple reps of walking forward/backward 10 feet, side stepping * holding wall: relaxed squats x 10 * UE support on barbell:  walking across width of pool backwards/forwards with cues for heel/toe and toe/heel (good tolerance) * holding wall: hip abdct/ addct 2 x 5; hip flex/ext (to toe touch) x 5 each (painful) * return to wall in relaxed squat position for decompression - then walking backwards/forwards across width of pool with  barbell * trial of straddling yellow noodle, holding wall   PATIENT EDUCATION:  Education details: Intro to aquatic therapy ; Issued list of pools  Person educated: Patient Education method: Explanation, Demonstration, and Handouts Education comprehension: verbalized understanding and returned demonstration  HOME EXERCISE PROGRAM: Access Code: [email or web address] URL: [email or web address] Date: 12/28/2022 Prepared by: Clydie Braun Menke  Exercises - Standing Transverse Abdominis Contraction  - 1 x daily - 7 x weekly - 2 sets - 10 reps - Seated Hamstring Stretch  - 1 x daily - 7 x weekly - 1 sets - 2 reps - 20 sec hold - Supine Transversus Abdominis Bracing - Hands on Thighs  - 1 x daily - 7 x weekly - 2 sets - 10 reps - Supine Lower Trunk Rotation  - 1 x daily - 7 x weekly - 1 sets - 10 reps - Supine Piriformis Stretch with Foot on Ground  - 1 x daily - 7 x weekly - 1 sets - 2 reps - 20 sec hold  ASSESSMENT:  CLINICAL IMPRESSION: Pt initially fearful of water, but acclimated well during session.  She was able to take direction from therapist. She requires UE support on wall or floatation device (barbell/noodle) until more confident.  At times during session, she reported reduction of pain to minimal 1/10. Pain spiked to 7/10 when in Rt SLS moving LLE into low hip flex/ext, as well as when exiting pool.  She reported feeling of nausea once fully out of water and sitting on bench; began to ease with rest.  Educated pt on what to expect in next few days (ie: fatigue, exercise soreness).  Goals are ongoing.     From eval:  Patient is a 57 y.o. female who was seen today for physical therapy evaluation and treatment for chronic low back pain with right-sided sciatica and prior grade 1 lumbar spondylolisthesis. Patient presented with 10/10 concordant pain that was easily reproduced via global lumbar AROM, palpation, and ambulation suggesting musculoskeletal system origin. Right lateral hip and  thigh pain are likely referral pain patterns from the lumbar spine as the patient's end range lumbar motion reproduced a "catching sharp pain" indicative of potential vertebral disc involvement. Likewise, palpation along the L3 distribution of the right medial knee reproduced pain in the low back further suggesting involvement at the lumbar spine. Due to the high irritability and intensity of pain, the patient demonstrated several compensatory mechanisms in an effort to offload the right hip in order to decrease pain resulting in an antalgic gait, right hip hike, and left trunk likely perpetuating the low back pain with overactive erector spinae and glute med muscles. Likewise, the patient admitted to making several lifestyle changes due to the intensity of the pain  that included avoiding stair navigation within the household due to fear of falling, hiring a housekeeper to clean her home, relying on husband to reach for things above her head, and no longer able to walk her dog. Patient would benefit from skilled physical therapy interventions to address limitations in sleeping, mobility, and strength in order to improve participation in work related requirements and return to previous community activity level.   OBJECTIVE IMPAIRMENTS: Abnormal gait, decreased activity tolerance, decreased balance, decreased coordination, decreased endurance, decreased mobility, difficulty walking, decreased ROM, decreased strength, impaired flexibility, improper body mechanics, postural dysfunction, and pain.   ACTIVITY LIMITATIONS: carrying, lifting, bending, sitting, standing, squatting, sleeping, stairs, transfers, bed mobility, bathing, toileting, dressing, reach over head, locomotion level, and caring for others  PARTICIPATION LIMITATIONS: cleaning, laundry, interpersonal relationship, driving, community activity, and occupation  PERSONAL FACTORS: Age, Fitness, Profession, Time since onset of  injury/illness/exacerbation, and 3+ comorbidities: HTN, pre-diabetes, lumbar spondylolisthesis  are also affecting patient's functional outcome.   REHAB POTENTIAL: Good  CLINICAL DECISION MAKING: Evolving/moderate complexity  EVALUATION COMPLEXITY: Moderate   GOALS: Goals reviewed with patient? Yes  SHORT TERM GOALS: Target date: 01/21/2023  Pt will be independent with initial HEP. Baseline: Goal status: INITIAL  2.  Patient will report at least a 25% improvement in symptoms with ADLs. Baseline:  Goal status: INITIAL  3. Patient will demonstrate the proper log roll technique to protect the back from unnecessary spinal compression that could reproduce low back pain.   Baseline: patient reported intense pain with bed mobility   Goal status: INITIAL      LONG TERM GOALS: Target date: 02/18/2023  Pt will be independent with advanced HEP to allow for self-progression following discharge. Baseline:  Goal status: INITIAL  2.  Patient will increase FOTO to at least 43 to demonstrate improvements with functional tasks. Baseline: 22% Goal status: INITIAL  3.  Patient will increase 30 sec sit to stand reps to 10 reps in order to perform work related duties requiring rising and lowering into a chair frequently.  Baseline: 2 reps Goal status: INITIAL  4.  Patient will decrease TUG time to no greater than 11 secs to no longer be at an increased likelihood of falls.  Baseline: 22.63 sec Goal status: INITIAL  5.  Patient will demonstrate proper body biomechanics with lifting an object off the floor for decreased risk of LBP reproduction during work related activities.  Baseline:  Goal status: INITIAL  6.  Patient will demonstrate no increased pain with driving more than 30mins required for travel to and from clients for work.  Baseline:  Goal status: INITIAL  PLAN:  PT FREQUENCY: 2x/week  PT DURATION: 8 weeks  PLANNED INTERVENTIONS: Therapeutic exercises, Therapeutic  activity, Neuromuscular re-education, Balance training, Gait training, Patient/Family education, Self Care, Joint mobilization, Joint manipulation, Stair training, Aquatic Therapy, Dry Needling, Electrical stimulation, Spinal manipulation, Spinal mobilization, Cryotherapy, Moist heat, Taping, Traction, Ultrasound, Ionotophoresis 4mg /ml Dexamethasone, Manual therapy, and Re-evaluation.  PLAN FOR NEXT SESSION: Assess and progress HEP as appropriate, core stability, strengthening, flexibility. Plan to follow up about aquatic therapy participation and potential dry needling session on low back and hip.   Mayer Camel, PTA 12/30/22 12:59 PM [facility] [address] [place], [place], [address] Phone: [phone]   Fax:  [phone]

## 2022-12-31 ENCOUNTER — Telehealth: Payer: Self-pay

## 2022-12-31 NOTE — Telephone Encounter (Signed)
Patient called stating that she is in so much pain and is wanting a call back to discuss if there is any Injection she can get to help with the pain to get her through to her nerve block procedure. everything she has been told to take is not working.

## 2023-01-03 DIAGNOSIS — M9906 Segmental and somatic dysfunction of lower extremity: Secondary | ICD-10-CM | POA: Diagnosis not present

## 2023-01-03 DIAGNOSIS — M9905 Segmental and somatic dysfunction of pelvic region: Secondary | ICD-10-CM | POA: Diagnosis not present

## 2023-01-03 DIAGNOSIS — G4733 Obstructive sleep apnea (adult) (pediatric): Secondary | ICD-10-CM | POA: Diagnosis not present

## 2023-01-03 DIAGNOSIS — M5387 Other specified dorsopathies, lumbosacral region: Secondary | ICD-10-CM | POA: Diagnosis not present

## 2023-01-03 DIAGNOSIS — M9903 Segmental and somatic dysfunction of lumbar region: Secondary | ICD-10-CM | POA: Diagnosis not present

## 2023-01-03 DIAGNOSIS — M5441 Lumbago with sciatica, right side: Secondary | ICD-10-CM | POA: Diagnosis not present

## 2023-01-05 DIAGNOSIS — M9906 Segmental and somatic dysfunction of lower extremity: Secondary | ICD-10-CM | POA: Diagnosis not present

## 2023-01-05 DIAGNOSIS — M5387 Other specified dorsopathies, lumbosacral region: Secondary | ICD-10-CM | POA: Diagnosis not present

## 2023-01-05 DIAGNOSIS — M9903 Segmental and somatic dysfunction of lumbar region: Secondary | ICD-10-CM | POA: Diagnosis not present

## 2023-01-05 DIAGNOSIS — M9905 Segmental and somatic dysfunction of pelvic region: Secondary | ICD-10-CM | POA: Diagnosis not present

## 2023-01-05 DIAGNOSIS — M5441 Lumbago with sciatica, right side: Secondary | ICD-10-CM | POA: Diagnosis not present

## 2023-01-06 ENCOUNTER — Other Ambulatory Visit (HOSPITAL_COMMUNITY): Payer: Self-pay

## 2023-01-07 ENCOUNTER — Ambulatory Visit: Payer: BC Managed Care – PPO | Admitting: Physical Therapy

## 2023-01-07 DIAGNOSIS — M5441 Lumbago with sciatica, right side: Secondary | ICD-10-CM | POA: Diagnosis not present

## 2023-01-07 DIAGNOSIS — R2689 Other abnormalities of gait and mobility: Secondary | ICD-10-CM | POA: Diagnosis not present

## 2023-01-07 DIAGNOSIS — R252 Cramp and spasm: Secondary | ICD-10-CM | POA: Diagnosis not present

## 2023-01-07 DIAGNOSIS — M5459 Other low back pain: Secondary | ICD-10-CM | POA: Diagnosis not present

## 2023-01-07 DIAGNOSIS — M4316 Spondylolisthesis, lumbar region: Secondary | ICD-10-CM | POA: Diagnosis not present

## 2023-01-07 DIAGNOSIS — G8929 Other chronic pain: Secondary | ICD-10-CM | POA: Diagnosis not present

## 2023-01-07 DIAGNOSIS — M6281 Muscle weakness (generalized): Secondary | ICD-10-CM | POA: Diagnosis not present

## 2023-01-07 NOTE — Therapy (Signed)
OUTPATIENT PHYSICAL THERAPY THORACOLUMBAR TREATMENT   Patient Name: Diana Schultz MRN: [id number] DOB:1966/06/19, 57 y.o., female Today's Date: 01/07/2023  END OF SESSION:  PT End of Session - 01/07/23 1225     Visit Number 3    Date for PT Re-Evaluation 02/18/23    Authorization Type BC/BS    PT Start Time 0930    PT Stop Time 1015    PT Time Calculation (min) 45 min    Activity Tolerance Patient limited by pain             Past Medical History:  Diagnosis Date   Adrenal adenoma    Anemia    Arteriovenous malformation    Depression    Diabetes mellitus without complication (HCC)    borderline - no meds   Diverticulitis    DVT (deep venous thrombosis) (HCC)    Hx at age 30 blood clot in right leg, d/c birth control pills and no other problems   Fatty liver    Gallbladder disease    Hypertension    history,lost weight, controlled with diet and exercise, no current med   Lactose intolerance    Morbid obesity (HCC)    Ovarian cyst    Prediabetes    Thalassemia    Umbilical hernia    Vaginal delivery 1984, 1989   Vitamin D deficiency    Past Surgical History:  Procedure Laterality Date   ABDOMINAL HYSTERECTOMY     ABLATION  03/23/13   [facility]   APPENDECTOMY     CHOLECYSTECTOMY     DILATION AND CURETTAGE OF UTERUS     DILATION AND CURETTAGE OF UTERUS N/A 11/01/2013   Procedure: DILATATION AND CURETTAGE;  Surgeon: Catalina Antigua, MD;  Location: [facility];  Service: Gynecology;  Laterality: N/A;   DILITATION & CURRETTAGE/HYSTROSCOPY WITH NOVASURE ABLATION N/A 03/23/2013   Procedure: DILATATION & CURETTAGE/HYSTEROSCOPY WITH NOVASURE ABLATION;  Surgeon: Willodean Rosenthal, MD;  Location: [facility];  Service: Gynecology;  Laterality: N/A;   LEEP     TUBAL LIGATION     VAGINAL HYSTERECTOMY N/A 12/31/2013   Procedure: HYSTERECTOMY VAGINAL;  Surgeon: Willodean Rosenthal, MD;  Location: [facility];  Service: Gynecology;  Laterality: N/A;   Patient Active Problem List    Diagnosis Date Noted   Renovascular hypertension 09/22/2021   Class 3 obesity with alveolar hypoventilation, serious comorbidity, and body mass index (BMI) of 50.0 to 59.9 in adult (HCC) 09/22/2021   Excessive daytime sleepiness 09/22/2021   Nocturia more than twice per night 09/22/2021   Snoring 09/22/2021   History of DVT of lower extremity 08/04/2020   Postmenopausal 09/11/2019   History of cholecystectomy 09/11/2019   Osteoarthritis of left hip 09/11/2019   Pulmonary arteriovenous fistula (HCC) 03/24/2018   Prediabetes 02/14/2018   Adrenal adenoma, left 12/16/2017   Chronic RLQ pain 04/20/2017   Deep dyspareunia 04/20/2017   Morbid obesity (HCC) 03/15/2017   Vitamin D deficiency 03/15/2017   Essential hypertension 08/29/2013    PCP: Laqueta Due, MD  REFERRING PROVIDER: Venetia Night, MD  REFERRING DIAG: [phone] (ICD-10-CM) - Chronic bilateral low back pain with right-sided sciatica M43.16 (ICD-10-CM) - Spondylolisthesis of lumbar region  Rationale for Evaluation and Treatment: Rehabilitation  THERAPY DIAG:  Other low back pain  Cramp and spasm  Muscle weakness (generalized)  ONSET DATE: February 2024  SUBJECTIVE:  SUBJECTIVE STATEMENT: The patient arrives in excruciating pain, stating this is the worst her right leg has hurt.  She states she was awesome while in the pool but afterward the heavy return of symptoms caused her to throw up.  She states even though she is wearing 2 Lidocaine patches her pain level is over 10/10.  She also reports the new onset of right tingling in toes.    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  Patient is a Sales promotion account executive and  present for companionship working 14 hour shifts 5 days a week.  Pt reports that pain began in February 2024, and has progressively gotten worse since then.  Pt goes to see a chiropractor 2 days per week.  States that the chiropractor did dry needling with e-stim yesterday.  Pt present with a friend, Talbert Forest during session.  PERTINENT HISTORY:  Adrenal incidentaloma, HTN, anemia, pre-diabeties 1st nerve block 40-50% relief 2nd nerve block scheduled for 7/30 MRI 7/27 PAIN:  Are you having pain? Yes: NPRS scale: >10/10 Pain location: low back radiating to right leg down to lateral knee Pain description: sharp Aggravating factors: walking, movement Relieving factors: hot shower  PRECAUTIONS: None  WEIGHT BEARING RESTRICTIONS: No  FALLS:  Has patient fallen in last 6 months? No  LIVING ENVIRONMENT: Lives with: lives with their spouse Lives in: House/apartment Stairs: Yes: Internal: 12 steps; on right going up Has following equipment at home: None  OCCUPATION: Previously worked as a Lawyer  PLOF: Independent and Leisure: walking 2 shih-tzus  PATIENT GOALS: To decrease pain.  NEXT MD VISIT: Lumbar MRI on 01/15/2023  OBJECTIVE:   DIAGNOSTIC FINDINGS:  Lumbar CT on 10/28/2022 IMPRESSION: 1. Severe lower lumbar facet arthrosis with grade 1 anterolisthesis at L4 and L5 L4-5. 2. Potentially symptomatic right greater than left lateral recess and neural foraminal stenosis at L4-5. 3. Moderate neural foraminal stenosis at L3-4 and L5-S1.  PATIENT SURVEYS:  Eval:  FOTO 22 (projected 88 by visit 14)  SCREENING FOR RED FLAGS: Bowel or bladder incontinence: No Spinal tumors: No Cauda equina syndrome: No Compression fracture: No Abdominal aneurysm: No  COGNITION: Overall cognitive status: Within functional limits for tasks assessed     SENSATION: WFL  MUSCLE LENGTH: Muscle restrictions present  Could not extend knees to end range in seated position   POSTURE: rounded  shoulders, forward head, decreased lumbar lordosis, posterior pelvic tilt, and weight shift left  PALPATION: Patient had tenderness along medial aspect of right knee that triggered concordant pain to back (L3) , tenderness on right lateral hip, right glute medius   LUMBAR ROM:   12/28/2022: Flexion 24 degrees with pain Extension 10 degrees with sharp pain and wincing Limited at least 75% with side bending with increased pain noted to right side, some pain to left side (reports more of SI joint pain region)  LOWER EXTREMITY ROM:    Global restrictions limited by 50%  LOWER EXTREMITY MMT:   12/28/2022:   Hip flexion: R 4-/5 limited by pain, L unable to perform limited by pain  Manual muscle testing terminated due to pain   LUMBAR SPECIAL TESTS:  Trendelenburg sign: Positive on left side  FUNCTIONAL TESTS:  12/28/2022: Timed up and go (TUG): 22.63 sec 30 seconds chair stand test:  2 stands  GAIT: Distance walked: 78ft Assistive device utilized: None Level  of assistance: Modified independence Comments: Patient relied on contacting objects around her while ambulating (following along the wall) for stability, antalgic gait, trunk lean to left, right hip hike to clear right foot off ground   TODAY'S TREATMENT:                   DATE: 01/07/2023  Discussed symptoms and positions of less pain Discussed strategy of left sidelying over pillow to open and decompress nerve root Left Sidelying over pillow with pillow b/w legs: ES IFC to right lumbar, lateral hip, just above the knee with heat 12 ma 12 min Patient given info on home TENs unit, where to purchase                                                                                                                DATE: 12/30/2022 Pt seen for aquatic therapy today.  Treatment took place in water 3.5-4.75 ft in depth at the [facility] pool. Temp of water was 91.  Pt entered/exited the pool via stairs independently in step-to  pattern with heavy bilat rail. * intro to aquatic therapy concepts * UE support on barbell, walking next to wall:  multiple reps of walking forward/backward 10 feet, side stepping * holding wall: relaxed squats x 10 * UE support on barbell:  walking across width of pool backwards/forwards with cues for heel/toe and toe/heel (good tolerance) * holding wall: hip abdct/ addct 2 x 5; hip flex/ext (to toe touch) x 5 each (painful) * return to wall in relaxed squat position for decompression - then walking backwards/forwards across width of pool with barbell * trial of straddling yellow noodle, holding wall   PATIENT EDUCATION:  Education details: Intro to aquatic therapy ; Issued list of pools  Person educated: Patient Education method: Explanation, Demonstration, and Handouts Education comprehension: verbalized understanding and returned demonstration  HOME EXERCISE PROGRAM: Access Code: [email or web address] URL: [email or web address] Date: 12/28/2022 Prepared by: Clydie Braun Menke  Exercises - Standing Transverse Abdominis Contraction  - 1 x daily - 7 x weekly - 2 sets - 10 reps - Seated Hamstring Stretch  - 1 x daily - 7 x weekly - 1 sets - 2 reps - 20 sec hold - Supine Transversus Abdominis Bracing - Hands on Thighs  - 1 x daily - 7 x weekly - 2 sets - 10 reps - Supine Lower Trunk Rotation  - 1 x daily - 7 x weekly - 1 sets - 10 reps - Supine Piriformis Stretch with Foot on Ground  - 1 x daily - 7 x weekly - 1 sets - 2 reps - 20 sec hold  ASSESSMENT:  CLINICAL IMPRESSION: The patient is limited by severe pain.  Symptoms remain peripheral and have recently moving into her foot as well.  She reports some relief with positioning over pillow with ES/heat but symptoms not centralized.  She has an upcoming MRI and nerve block #2.  Therapist closely monitoring symptoms and modifying symptoms accordingly.     From eval:  Patient is  a 57 y.o. female who was seen today for physical therapy  evaluation and treatment for chronic low back pain with right-sided sciatica and prior grade 1 lumbar spondylolisthesis. Patient presented with 10/10 concordant pain that was easily reproduced via global lumbar AROM, palpation, and ambulation suggesting musculoskeletal system origin. Right lateral hip and thigh pain are likely referral pain patterns from the lumbar spine as the patient's end range lumbar motion reproduced a "catching sharp pain" indicative of potential vertebral disc involvement. Likewise, palpation along the L3 distribution of the right medial knee reproduced pain in the low back further suggesting involvement at the lumbar spine. Due to the high irritability and intensity of pain, the patient demonstrated several compensatory mechanisms in an effort to offload the right hip in order to decrease pain resulting in an antalgic gait, right hip hike, and left trunk likely perpetuating the low back pain with overactive erector spinae and glute med muscles. Likewise, the patient admitted to making several lifestyle changes due to the intensity of the pain that included avoiding stair navigation within the household due to fear of falling, hiring a housekeeper to clean her home, relying on husband to reach for things above her head, and no longer able to walk her dog. Patient would benefit from skilled physical therapy interventions to address limitations in sleeping, mobility, and strength in order to improve participation in work related requirements and return to previous community activity level.   OBJECTIVE IMPAIRMENTS: Abnormal gait, decreased activity tolerance, decreased balance, decreased coordination, decreased endurance, decreased mobility, difficulty walking, decreased ROM, decreased strength, impaired flexibility, improper body mechanics, postural dysfunction, and pain.   ACTIVITY LIMITATIONS: carrying, lifting, bending, sitting, standing, squatting, sleeping, stairs, transfers, bed  mobility, bathing, toileting, dressing, reach over head, locomotion level, and caring for others  PARTICIPATION LIMITATIONS: cleaning, laundry, interpersonal relationship, driving, community activity, and occupation  PERSONAL FACTORS: Age, Fitness, Profession, Time since onset of injury/illness/exacerbation, and 3+ comorbidities: HTN, pre-diabetes, lumbar spondylolisthesis  are also affecting patient's functional outcome.   REHAB POTENTIAL: Good  CLINICAL DECISION MAKING: Evolving/moderate complexity  EVALUATION COMPLEXITY: Moderate   GOALS: Goals reviewed with patient? Yes  SHORT TERM GOALS: Target date: 01/21/2023  Pt will be independent with initial HEP. Baseline: Goal status: INITIAL  2.  Patient will report at least a 25% improvement in symptoms with ADLs. Baseline:  Goal status: INITIAL  3. Patient will demonstrate the proper log roll technique to protect the back from unnecessary spinal compression that could reproduce low back pain.   Baseline: patient reported intense pain with bed mobility   Goal status: INITIAL      LONG TERM GOALS: Target date: 02/18/2023  Pt will be independent with advanced HEP to allow for self-progression following discharge. Baseline:  Goal status: INITIAL  2.  Patient will increase FOTO to at least 43 to demonstrate improvements with functional tasks. Baseline: 22% Goal status: INITIAL  3.  Patient will increase 30 sec sit to stand reps to 10 reps in order to perform work related duties requiring rising and lowering into a chair frequently.  Baseline: 2 reps Goal status: INITIAL  4.  Patient will decrease TUG time to no greater than 11 secs to no longer be at an increased likelihood of falls.  Baseline: 22.63 sec Goal status: INITIAL  5.  Patient will demonstrate proper body biomechanics with lifting an object off the floor for decreased risk of LBP reproduction during work related activities.  Baseline:  Goal status: INITIAL  6.  Patient will demonstrate no increased pain with driving more than 30mins required for travel to and from clients for work.  Baseline:  Goal status: INITIAL  PLAN:  PT FREQUENCY: 2x/week  PT DURATION: 8 weeks  PLANNED INTERVENTIONS: Therapeutic exercises, Therapeutic activity, Neuromuscular re-education, Balance training, Gait training, Patient/Family education, Self Care, Joint mobilization, Joint manipulation, Stair training, Aquatic Therapy, Dry Needling, Electrical stimulation, Spinal manipulation, Spinal mobilization, Cryotherapy, Moist heat, Taping, Traction, Ultrasound, Ionotophoresis 4mg /ml Dexamethasone, Manual therapy, and Re-evaluation.  PLAN FOR NEXT SESSION: Assess response to sidelying lateral compartment from today; see if pt got TENS; may try standing lateral glides;  core stability, strengthening, flexibility. Plan to follow up about aquatic therapy participation and potential dry needling session on low back and hip.   Lavinia Sharps, PT 01/07/23 12:27 PM Phone: [phone] Fax: [phone]

## 2023-01-10 ENCOUNTER — Other Ambulatory Visit (HOSPITAL_COMMUNITY): Payer: Self-pay

## 2023-01-10 DIAGNOSIS — M5441 Lumbago with sciatica, right side: Secondary | ICD-10-CM | POA: Diagnosis not present

## 2023-01-10 DIAGNOSIS — M9903 Segmental and somatic dysfunction of lumbar region: Secondary | ICD-10-CM | POA: Diagnosis not present

## 2023-01-10 DIAGNOSIS — M9905 Segmental and somatic dysfunction of pelvic region: Secondary | ICD-10-CM | POA: Diagnosis not present

## 2023-01-10 DIAGNOSIS — M5387 Other specified dorsopathies, lumbosacral region: Secondary | ICD-10-CM | POA: Diagnosis not present

## 2023-01-10 DIAGNOSIS — M9906 Segmental and somatic dysfunction of lower extremity: Secondary | ICD-10-CM | POA: Diagnosis not present

## 2023-01-11 ENCOUNTER — Ambulatory Visit: Payer: BC Managed Care – PPO | Admitting: Physical Therapy

## 2023-01-11 DIAGNOSIS — R252 Cramp and spasm: Secondary | ICD-10-CM | POA: Diagnosis not present

## 2023-01-11 DIAGNOSIS — M6281 Muscle weakness (generalized): Secondary | ICD-10-CM

## 2023-01-11 DIAGNOSIS — M5441 Lumbago with sciatica, right side: Secondary | ICD-10-CM | POA: Diagnosis not present

## 2023-01-11 DIAGNOSIS — M4316 Spondylolisthesis, lumbar region: Secondary | ICD-10-CM | POA: Diagnosis not present

## 2023-01-11 DIAGNOSIS — G4733 Obstructive sleep apnea (adult) (pediatric): Secondary | ICD-10-CM | POA: Diagnosis not present

## 2023-01-11 DIAGNOSIS — G8929 Other chronic pain: Secondary | ICD-10-CM | POA: Diagnosis not present

## 2023-01-11 DIAGNOSIS — R2689 Other abnormalities of gait and mobility: Secondary | ICD-10-CM | POA: Diagnosis not present

## 2023-01-11 DIAGNOSIS — M5459 Other low back pain: Secondary | ICD-10-CM | POA: Diagnosis not present

## 2023-01-11 NOTE — Therapy (Signed)
OUTPATIENT PHYSICAL THERAPY THORACOLUMBAR TREATMENT   Patient Name: Diana Schultz MRN: [id number] DOB:1966-01-09, 57 y.o., female Today's Date: 01/11/2023  END OF SESSION:  PT End of Session - 01/11/23 1019     Visit Number 4    Date for PT Re-Evaluation 02/18/23    Authorization Type BC/BS    PT Start Time 1018    PT Stop Time 1100    PT Time Calculation (min) 42 min    Activity Tolerance Patient limited by pain             Past Medical History:  Diagnosis Date   Adrenal adenoma    Anemia    Arteriovenous malformation    Depression    Diabetes mellitus without complication (HCC)    borderline - no meds   Diverticulitis    DVT (deep venous thrombosis) (HCC)    Hx at age 67 blood clot in right leg, d/c birth control pills and no other problems   Fatty liver    Gallbladder disease    Hypertension    history,lost weight, controlled with diet and exercise, no current med   Lactose intolerance    Morbid obesity (HCC)    Ovarian cyst    Prediabetes    Thalassemia    Umbilical hernia    Vaginal delivery 1984, 1989   Vitamin D deficiency    Past Surgical History:  Procedure Laterality Date   ABDOMINAL HYSTERECTOMY     ABLATION  03/23/13   [facility]   APPENDECTOMY     CHOLECYSTECTOMY     DILATION AND CURETTAGE OF UTERUS     DILATION AND CURETTAGE OF UTERUS N/A 11/01/2013   Procedure: DILATATION AND CURETTAGE;  Surgeon: Catalina Antigua, MD;  Location: [facility];  Service: Gynecology;  Laterality: N/A;   DILITATION & CURRETTAGE/HYSTROSCOPY WITH NOVASURE ABLATION N/A 03/23/2013   Procedure: DILATATION & CURETTAGE/HYSTEROSCOPY WITH NOVASURE ABLATION;  Surgeon: Willodean Rosenthal, MD;  Location: [facility];  Service: Gynecology;  Laterality: N/A;   LEEP     TUBAL LIGATION     VAGINAL HYSTERECTOMY N/A 12/31/2013   Procedure: HYSTERECTOMY VAGINAL;  Surgeon: Willodean Rosenthal, MD;  Location: [facility];  Service: Gynecology;  Laterality: N/A;   Patient Active Problem List    Diagnosis Date Noted   Renovascular hypertension 09/22/2021   Class 3 obesity with alveolar hypoventilation, serious comorbidity, and body mass index (BMI) of 50.0 to 59.9 in adult (HCC) 09/22/2021   Excessive daytime sleepiness 09/22/2021   Nocturia more than twice per night 09/22/2021   Snoring 09/22/2021   History of DVT of lower extremity 08/04/2020   Postmenopausal 09/11/2019   History of cholecystectomy 09/11/2019   Osteoarthritis of left hip 09/11/2019   Pulmonary arteriovenous fistula (HCC) 03/24/2018   Prediabetes 02/14/2018   Adrenal adenoma, left 12/16/2017   Chronic RLQ pain 04/20/2017   Deep dyspareunia 04/20/2017   Morbid obesity (HCC) 03/15/2017   Vitamin D deficiency 03/15/2017   Essential hypertension 08/29/2013    PCP: Laqueta Due, MD  REFERRING PROVIDER: Venetia Night, MD  REFERRING DIAG: [phone] (ICD-10-CM) - Chronic bilateral low back pain with right-sided sciatica M43.16 (ICD-10-CM) - Spondylolisthesis of lumbar region  Rationale for Evaluation and Treatment: Rehabilitation  THERAPY DIAG:  Other low back pain  Cramp and spasm  Muscle weakness (generalized)  ONSET DATE: February 2024  SUBJECTIVE:  SUBJECTIVE STATEMENT: The tingling in my toes has worsened.  Right posterior knee pain.  I hurt all night.  I was miserable trying to sleep.  Things just help temporarily.   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  Pt reports that pain began in February 2024, and has progressively gotten worse since then.  Pt goes to see a chiropractor 2 days per week.   States that the chiropractor did dry needling with e-stim yesterday.  Pt present with a friend, Talbert Forest during session.  PERTINENT HISTORY:  Adrenal incidentaloma, HTN, anemia, pre-diabeties 1st nerve block 40-50% relief 2nd nerve block scheduled for 7/30 MRI 7/27 PAIN:  Are you having pain? Yes: NPRS scale: >10/10 Pain location: low back radiating to right leg down to lateral knee Pain description: sharp Aggravating factors: walking, movement Relieving factors: hot shower  PRECAUTIONS: None  WEIGHT BEARING RESTRICTIONS: No  FALLS:  Has patient fallen in last 6 months? No  LIVING ENVIRONMENT: Lives with: lives with their spouse Lives in: House/apartment Stairs: Yes: Internal: 12 steps; on right going up Has following equipment at home: None  OCCUPATION: Previously worked as a Lawyer  PLOF: Independent and Leisure: walking 2 shih-tzus  PATIENT GOALS: To decrease pain.  NEXT MD VISIT: Lumbar MRI on 01/15/2023  OBJECTIVE:   DIAGNOSTIC FINDINGS:  Lumbar CT on 10/28/2022 IMPRESSION: 1. Severe lower lumbar facet arthrosis with grade 1 anterolisthesis at L4 and L5 L4-5. 2. Potentially symptomatic right greater than left lateral recess and neural foraminal stenosis at L4-5. 3. Moderate neural foraminal stenosis at L3-4 and L5-S1.  PATIENT SURVEYS:  Eval:  FOTO 22 (projected 74 by visit 14)  SCREENING FOR RED FLAGS: Bowel or bladder incontinence: No Spinal tumors: No Cauda equina syndrome: No Compression fracture: No Abdominal aneurysm: No  COGNITION: Overall cognitive status: Within functional limits for tasks assessed     SENSATION: WFL  MUSCLE LENGTH: Muscle restrictions present  Could not extend knees to end range in seated position   POSTURE: rounded shoulders, forward head, decreased lumbar lordosis, posterior pelvic tilt, and weight shift left  PALPATION: Patient had tenderness along medial aspect of right knee that triggered concordant pain to back (L3)  , tenderness on right lateral hip, right glute medius   LUMBAR ROM:   12/28/2022: Flexion 24 degrees with pain Extension 10 degrees with sharp pain and wincing Limited at least 75% with side bending with increased pain noted to right side, some pain to left side (reports more of SI joint pain region)  LOWER EXTREMITY ROM:    Global restrictions limited by 50%  LOWER EXTREMITY MMT:   12/28/2022:   Hip flexion: R 4-/5 limited by pain, L unable to perform limited by pain  Manual muscle testing terminated due to pain   LUMBAR SPECIAL TESTS:  Trendelenburg sign: Positive on left side  FUNCTIONAL TESTS:  12/28/2022: Timed up and go (TUG): 22.63 sec 30 seconds chair stand test:  2 stands  GAIT: Distance walked: 55ft Assistive device utilized: None Level of assistance: Modified independence Comments: Patient relied on contacting objects around her while ambulating (following along the wall) for stability, antalgic gait, trunk lean to left, right hip hike to clear right foot off ground   TODAY'S TREATMENT:  DATE: 01/11/2023  Patient education of referred/radicular pain and discussion of upcoming MRI and ESI Discussed next week's pool visit and limiting time in the water since the transition out of the water was so difficult Trigger Point Dry-Needling  Treatment instructions: Expect mild to moderate muscle soreness. S/S of pneumothorax if dry needled over a lung field, and to seek immediate medical attention should they occur. Patient verbalized understanding of these instructions and education.  Patient Consent Given: Yes Education handout provided: Previously provided Muscles treated: in sidelying: right gluteals, right ITB Electrical stimulation performed: Yes Parameters: 15 min to right gluteals and ITB 1.5 ma 80 pps Treatment response/outcome: following treatment pt rises with ease and demonstrates much improved hip, knee and trunk ROM                 DATE: 01/07/2023  Discussed  symptoms and positions of less pain Discussed strategy of left sidelying over pillow to open and decompress nerve root Left Sidelying over pillow with pillow b/w legs: ES IFC to right lumbar, lateral hip, just above the knee with heat 12 ma 12 min Patient given info on home TENs unit, where to purchase                                                                                                                DATE: 12/30/2022 Pt seen for aquatic therapy today.  Treatment took place in water 3.5-4.75 ft in depth at the [facility] pool. Temp of water was 91.  Pt entered/exited the pool via stairs independently in step-to pattern with heavy bilat rail. * intro to aquatic therapy concepts * UE support on barbell, walking next to wall:  multiple reps of walking forward/backward 10 feet, side stepping * holding wall: relaxed squats x 10 * UE support on barbell:  walking across width of pool backwards/forwards with cues for heel/toe and toe/heel (good tolerance) * holding wall: hip abdct/ addct 2 x 5; hip flex/ext (to toe touch) x 5 each (painful) * return to wall in relaxed squat position for decompression - then walking backwards/forwards across width of pool with barbell * trial of straddling yellow noodle, holding wall   PATIENT EDUCATION:  Education details: Intro to aquatic therapy ; Issued list of pools  Person educated: Patient Education method: Explanation, Demonstration, and Handouts Education comprehension: verbalized understanding and returned demonstration  HOME EXERCISE PROGRAM: Access Code: [email or web address] URL: [email or web address] Date: 12/28/2022 Prepared by: Clydie Braun Menke  Exercises - Standing Transverse Abdominis Contraction  - 1 x daily - 7 x weekly - 2 sets - 10 reps - Seated Hamstring Stretch  - 1 x daily - 7 x weekly - 1 sets - 2 reps - 20 sec hold - Supine Transversus Abdominis Bracing - Hands on Thighs  - 1 x daily - 7 x weekly - 2 sets - 10  reps - Supine Lower Trunk Rotation  - 1 x daily - 7 x weekly - 1 sets - 10 reps - Supine Piriformis Stretch with Foot on Ground  -  1 x daily - 7 x weekly - 1 sets - 2 reps - 20 sec hold  ASSESSMENT:  CLINICAL IMPRESSION: The patient arrives with 10/10 pain and antalgic, guarded movement.  Patient open to trying DN with ES (had once before).  She was quite sensitive during the treatment but less painful with application of ES.  Following the session the patient is able to move her right leg with ease.  She is able to transfer sidelying to sitting without assistance and is able to shift her weight onto her right side.  She is able to walk normally and at a faster speed.  She is excited with the dramatic improvement and rates her pain at a 2-3/10.  She was encouraged to keep moving to help maintain soft tissue mobility.  From eval:  Patient is a 57 y.o. female who was seen today for physical therapy evaluation and treatment for chronic low back pain with right-sided sciatica and prior grade 1 lumbar spondylolisthesis. Patient presented with 10/10 concordant pain that was easily reproduced via global lumbar AROM, palpation, and ambulation suggesting musculoskeletal system origin. Right lateral hip and thigh pain are likely referral pain patterns from the lumbar spine as the patient's end range lumbar motion reproduced a "catching sharp pain" indicative of potential vertebral disc involvement. Likewise, palpation along the L3 distribution of the right medial knee reproduced pain in the low back further suggesting involvement at the lumbar spine. Due to the high irritability and intensity of pain, the patient demonstrated several compensatory mechanisms in an effort to offload the right hip in order to decrease pain resulting in an antalgic gait, right hip hike, and left trunk likely perpetuating the low back pain with overactive erector spinae and glute med muscles. Likewise, the patient admitted to making  several lifestyle changes due to the intensity of the pain that included avoiding stair navigation within the household due to fear of falling, hiring a housekeeper to clean her home, relying on husband to reach for things above her head, and no longer able to walk her dog. Patient would benefit from skilled physical therapy interventions to address limitations in sleeping, mobility, and strength in order to improve participation in work related requirements and return to previous community activity level.   OBJECTIVE IMPAIRMENTS: Abnormal gait, decreased activity tolerance, decreased balance, decreased coordination, decreased endurance, decreased mobility, difficulty walking, decreased ROM, decreased strength, impaired flexibility, improper body mechanics, postural dysfunction, and pain.   ACTIVITY LIMITATIONS: carrying, lifting, bending, sitting, standing, squatting, sleeping, stairs, transfers, bed mobility, bathing, toileting, dressing, reach over head, locomotion level, and caring for others  PARTICIPATION LIMITATIONS: cleaning, laundry, interpersonal relationship, driving, community activity, and occupation  PERSONAL FACTORS: Age, Fitness, Profession, Time since onset of injury/illness/exacerbation, and 3+ comorbidities: HTN, pre-diabetes, lumbar spondylolisthesis  are also affecting patient's functional outcome.   REHAB POTENTIAL: Good  CLINICAL DECISION MAKING: Evolving/moderate complexity  EVALUATION COMPLEXITY: Moderate   GOALS: Goals reviewed with patient? Yes  SHORT TERM GOALS: Target date: 01/21/2023  Pt will be independent with initial HEP. Baseline: Goal status: INITIAL  2.  Patient will report at least a 25% improvement in symptoms with ADLs. Baseline:  Goal status: INITIAL  3. Patient will demonstrate the proper log roll technique to protect the back from unnecessary spinal compression that could reproduce low back pain.   Baseline: patient reported intense pain with bed  mobility   Goal status: INITIAL      LONG TERM GOALS: Target date: 02/18/2023  Pt will be  independent with advanced HEP to allow for self-progression following discharge. Baseline:  Goal status: INITIAL  2.  Patient will increase FOTO to at least 43 to demonstrate improvements with functional tasks. Baseline: 22% Goal status: INITIAL  3.  Patient will increase 30 sec sit to stand reps to 10 reps in order to perform work related duties requiring rising and lowering into a chair frequently.  Baseline: 2 reps Goal status: INITIAL  4.  Patient will decrease TUG time to no greater than 11 secs to no longer be at an increased likelihood of falls.  Baseline: 22.63 sec Goal status: INITIAL  5.  Patient will demonstrate proper body biomechanics with lifting an object off the floor for decreased risk of LBP reproduction during work related activities.  Baseline:  Goal status: INITIAL  6.  Patient will demonstrate no increased pain with driving more than 30mins required for travel to and from clients for work.  Baseline:  Goal status: INITIAL  PLAN:  PT FREQUENCY: 2x/week  PT DURATION: 8 weeks  PLANNED INTERVENTIONS: Therapeutic exercises, Therapeutic activity, Neuromuscular re-education, Balance training, Gait training, Patient/Family education, Self Care, Joint mobilization, Joint manipulation, Stair training, Aquatic Therapy, Dry Needling, Electrical stimulation, Spinal manipulation, Spinal mobilization, Cryotherapy, Moist heat, Taping, Traction, Ultrasound, Ionotophoresis 4mg /ml Dexamethasone, Manual therapy, and Re-evaluation.  PLAN FOR NEXT SESSION: add DN with ES to lumbar multifidi, continue DN with ES to right gluteals and ITB;  increase mobility as pain level decreases; aquatic therapy; upcoming MRI and ESI #2  Lavinia Sharps, PT 01/11/23 7:45 PM Phone: [phone] Fax: [phone]

## 2023-01-12 DIAGNOSIS — M9902 Segmental and somatic dysfunction of thoracic region: Secondary | ICD-10-CM | POA: Diagnosis not present

## 2023-01-12 DIAGNOSIS — M5387 Other specified dorsopathies, lumbosacral region: Secondary | ICD-10-CM | POA: Diagnosis not present

## 2023-01-12 DIAGNOSIS — M9901 Segmental and somatic dysfunction of cervical region: Secondary | ICD-10-CM | POA: Diagnosis not present

## 2023-01-12 DIAGNOSIS — M9906 Segmental and somatic dysfunction of lower extremity: Secondary | ICD-10-CM | POA: Diagnosis not present

## 2023-01-12 DIAGNOSIS — M9903 Segmental and somatic dysfunction of lumbar region: Secondary | ICD-10-CM | POA: Diagnosis not present

## 2023-01-12 DIAGNOSIS — M5441 Lumbago with sciatica, right side: Secondary | ICD-10-CM | POA: Diagnosis not present

## 2023-01-12 DIAGNOSIS — M9905 Segmental and somatic dysfunction of pelvic region: Secondary | ICD-10-CM | POA: Diagnosis not present

## 2023-01-15 ENCOUNTER — Ambulatory Visit: Payer: BC Managed Care – PPO

## 2023-01-15 DIAGNOSIS — M5441 Lumbago with sciatica, right side: Secondary | ICD-10-CM

## 2023-01-15 DIAGNOSIS — M47816 Spondylosis without myelopathy or radiculopathy, lumbar region: Secondary | ICD-10-CM | POA: Diagnosis not present

## 2023-01-15 DIAGNOSIS — M5136 Other intervertebral disc degeneration, lumbar region: Secondary | ICD-10-CM | POA: Diagnosis not present

## 2023-01-15 DIAGNOSIS — G8929 Other chronic pain: Secondary | ICD-10-CM | POA: Diagnosis not present

## 2023-01-15 DIAGNOSIS — M5137 Other intervertebral disc degeneration, lumbosacral region: Secondary | ICD-10-CM | POA: Diagnosis not present

## 2023-01-15 DIAGNOSIS — M7989 Other specified soft tissue disorders: Secondary | ICD-10-CM

## 2023-01-15 DIAGNOSIS — M48061 Spinal stenosis, lumbar region without neurogenic claudication: Secondary | ICD-10-CM | POA: Diagnosis not present

## 2023-01-17 ENCOUNTER — Ambulatory Visit (HOSPITAL_BASED_OUTPATIENT_CLINIC_OR_DEPARTMENT_OTHER): Payer: BC Managed Care – PPO | Admitting: Physical Therapy

## 2023-01-17 DIAGNOSIS — M9903 Segmental and somatic dysfunction of lumbar region: Secondary | ICD-10-CM | POA: Diagnosis not present

## 2023-01-17 DIAGNOSIS — M9906 Segmental and somatic dysfunction of lower extremity: Secondary | ICD-10-CM | POA: Diagnosis not present

## 2023-01-17 DIAGNOSIS — M5387 Other specified dorsopathies, lumbosacral region: Secondary | ICD-10-CM | POA: Diagnosis not present

## 2023-01-17 DIAGNOSIS — M5441 Lumbago with sciatica, right side: Secondary | ICD-10-CM | POA: Diagnosis not present

## 2023-01-17 DIAGNOSIS — M9905 Segmental and somatic dysfunction of pelvic region: Secondary | ICD-10-CM | POA: Diagnosis not present

## 2023-01-17 NOTE — Discharge Instructions (Signed)
Medial Branch Block Discharge Instructions  Take over-the-counter and prescription medicines only as told by your health care provider.  Do not drive the day of your procedure  Return to your normal activities as told by your health care provider.   If injection site is sore you may ice the area for 20 minutes, 2-3 times a day.   Check your injection site every day for signs of infection. Check for: Redness, swelling, or pain. Fluid or blood. Warmth. Pus or a bad smell.  Please contact our office at [phone] if: You have a fever or chills. You have any signs of infection. You develop any numbness or weakness.   Thank you for visiting our office.  

## 2023-01-18 ENCOUNTER — Inpatient Hospital Stay
Admission: RE | Admit: 2023-01-18 | Discharge: 2023-01-18 | Disposition: A | Discharge status: Home / Self Care | Payer: BC Managed Care – PPO | Source: Ambulatory Visit | Attending: Neurosurgery | Admitting: Neurosurgery

## 2023-01-18 ENCOUNTER — Other Ambulatory Visit: Payer: BC Managed Care – PPO

## 2023-01-18 ENCOUNTER — Telehealth: Payer: Self-pay

## 2023-01-18 NOTE — Telephone Encounter (Signed)
I spoke with Mrs Fagerstrom. She reports 40-50% relief from the previous MBB and no relief from the previous ESI. She is doing PT, chiropractor, dry needling, NSAIDS, gabapentin and is still experiencing significant pain. I explained the above to the nurse reviewer with [facility]. Their usual requirement is 80% relief. They will forward this to the medical director, but they still suggest a peer to peer because this is the only thing that has given her ANY relief. The patient is aware that this auth request will likely be denied due to reeiving less than 80 relief.   # for peer to peer scheduling is [phone] and should be done within the next 10 days. Case # [id number]  She had a new MRI on 01/15/23 ordered by Dr Jahmal Dunavant Rosenthal. She has a follow up with Dr Myer Haff on 02/03/23. She is agreeable to trying another type of injection or anything else that is recommended once her new MRI is reviewed. She is aware that Dr Myer Haff is on vacation 8/1-8/11.

## 2023-01-18 NOTE — Telephone Encounter (Signed)
Patient just called please make sure that you notify her once you have approval. She is in a lot of pain.

## 2023-01-18 NOTE — Telephone Encounter (Signed)
Per Zollie Scale with [facility]: Diana Schultz is requesting a peer to peer for the pt's injections. The phone number is [phone] to schedule the peer to peer with [facility]. Dr. Malachi Pro will be the one doing the injection his NPI  [id number]. Facility NPI is [id number] [facility]. CPT codes are 22025 and [phone] for a medial branch block. The order ID is [id number].

## 2023-01-18 NOTE — Telephone Encounter (Signed)
Peer to peer scheduled for 12:15 tomorrow.

## 2023-01-19 DIAGNOSIS — M9906 Segmental and somatic dysfunction of lower extremity: Secondary | ICD-10-CM | POA: Diagnosis not present

## 2023-01-19 DIAGNOSIS — M9901 Segmental and somatic dysfunction of cervical region: Secondary | ICD-10-CM | POA: Diagnosis not present

## 2023-01-19 DIAGNOSIS — M9903 Segmental and somatic dysfunction of lumbar region: Secondary | ICD-10-CM | POA: Diagnosis not present

## 2023-01-19 DIAGNOSIS — M9902 Segmental and somatic dysfunction of thoracic region: Secondary | ICD-10-CM | POA: Diagnosis not present

## 2023-01-19 DIAGNOSIS — M5441 Lumbago with sciatica, right side: Secondary | ICD-10-CM | POA: Diagnosis not present

## 2023-01-19 DIAGNOSIS — M9905 Segmental and somatic dysfunction of pelvic region: Secondary | ICD-10-CM | POA: Diagnosis not present

## 2023-01-19 DIAGNOSIS — M5387 Other specified dorsopathies, lumbosacral region: Secondary | ICD-10-CM | POA: Diagnosis not present

## 2023-01-19 NOTE — Telephone Encounter (Signed)
Spoke with Dr. Morton Amy and MBB are denied because she did not get 80% better.   He recommends doing a formal appeal to her health plan once we get the denial. May want to see what her updated lumbar MRI shows and go from there.   Please let her know.

## 2023-01-19 NOTE — Telephone Encounter (Signed)
She has been notified.

## 2023-01-19 NOTE — Telephone Encounter (Signed)
She is calling that he MRI results are in her chart. Can she please get an appt with Dr.Yarbrough now.

## 2023-01-19 NOTE — Telephone Encounter (Signed)
Please let her know that Dr Myer Haff looked at her MRI report and he said  he will discuss with her at her appointment on 8/15. Please remind her that he is out of the office 8/1-8/11 and make sure she is on the cancellation list and he apologizes that he doesn't have anything any sooner.

## 2023-01-21 ENCOUNTER — Ambulatory Visit: Payer: BC Managed Care – PPO | Attending: Neurosurgery | Admitting: Rehabilitative and Restorative Service Providers"

## 2023-01-21 ENCOUNTER — Encounter: Payer: Self-pay | Admitting: Rehabilitative and Restorative Service Providers"

## 2023-01-21 DIAGNOSIS — R252 Cramp and spasm: Secondary | ICD-10-CM | POA: Diagnosis not present

## 2023-01-21 DIAGNOSIS — R2689 Other abnormalities of gait and mobility: Secondary | ICD-10-CM | POA: Diagnosis not present

## 2023-01-21 DIAGNOSIS — M6281 Muscle weakness (generalized): Secondary | ICD-10-CM | POA: Diagnosis not present

## 2023-01-21 DIAGNOSIS — M5459 Other low back pain: Secondary | ICD-10-CM | POA: Diagnosis not present

## 2023-01-21 NOTE — Therapy (Addendum)
OUTPATIENT PHYSICAL THERAPY TREATMENT NOTE AND LATE ENTRY DISCHARGE SUMMARY   Patient Name: Diana Schultz MRN: [id number] DOB:1965/12/16, 57 y.o., female Today's Date: 01/21/2023  END OF SESSION:  PT End of Session - 01/21/23 0855     Visit Number 5    Date for PT Re-Evaluation 02/18/23    Authorization Type BC/BS    PT Start Time [phone]    PT Stop Time 0930    PT Time Calculation (min) 38 min    Activity Tolerance Patient limited by pain    Behavior During Therapy WFL for tasks assessed/performed             Past Medical History:  Diagnosis Date   Adrenal adenoma    Anemia    Arteriovenous malformation    Depression    Diabetes mellitus without complication (HCC)    borderline - no meds   Diverticulitis    DVT (deep venous thrombosis) (HCC)    Hx at age 26 blood clot in right leg, d/c birth control pills and no other problems   Fatty liver    Gallbladder disease    Hypertension    history,lost weight, controlled with diet and exercise, no current med   Lactose intolerance    Morbid obesity (HCC)    Ovarian cyst    Prediabetes    Thalassemia    Umbilical hernia    Vaginal delivery 1984, 1989   Vitamin D deficiency    Past Surgical History:  Procedure Laterality Date   ABDOMINAL HYSTERECTOMY     ABLATION  03/23/13   [facility]   APPENDECTOMY     CHOLECYSTECTOMY     DILATION AND CURETTAGE OF UTERUS     DILATION AND CURETTAGE OF UTERUS N/A 11/01/2013   Procedure: DILATATION AND CURETTAGE;  Surgeon: Catalina Antigua, MD;  Location: [facility];  Service: Gynecology;  Laterality: N/A;   DILITATION & CURRETTAGE/HYSTROSCOPY WITH NOVASURE ABLATION N/A 03/23/2013   Procedure: DILATATION & CURETTAGE/HYSTEROSCOPY WITH NOVASURE ABLATION;  Surgeon: Willodean Rosenthal, MD;  Location: [facility];  Service: Gynecology;  Laterality: N/A;   LEEP     TUBAL LIGATION     VAGINAL HYSTERECTOMY N/A 12/31/2013   Procedure: HYSTERECTOMY VAGINAL;  Surgeon: Willodean Rosenthal, MD;  Location: [facility];  Service: Gynecology;  Laterality: N/A;   Patient Active Problem List   Diagnosis Date Noted   Renovascular hypertension 09/22/2021   Class 3 obesity with alveolar hypoventilation, serious comorbidity, and body mass index (BMI) of 50.0 to 59.9 in adult (HCC) 09/22/2021   Excessive daytime sleepiness 09/22/2021   Nocturia more than twice per night 09/22/2021   Snoring 09/22/2021   History of DVT of lower extremity 08/04/2020   Postmenopausal 09/11/2019   History of cholecystectomy 09/11/2019   Osteoarthritis of left hip 09/11/2019   Pulmonary arteriovenous fistula (HCC) 03/24/2018   Prediabetes 02/14/2018   Adrenal adenoma, left 12/16/2017   Chronic RLQ pain 04/20/2017   Deep dyspareunia 04/20/2017   Morbid obesity (HCC) 03/15/2017   Vitamin D deficiency 03/15/2017   Essential hypertension 08/29/2013    PCP: Laqueta Due, MD  REFERRING PROVIDER: Venetia Night, MD  REFERRING DIAG: [phone] (ICD-10-CM) - Chronic bilateral low back pain with right-sided sciatica M43.16 (ICD-10-CM) - Spondylolisthesis of lumbar region  Rationale for Evaluation and Treatment: Rehabilitation  THERAPY DIAG:  Other low back pain  Cramp and spasm  Muscle weakness (generalized)  Other abnormalities of gait and mobility  ONSET DATE: February 2024  SUBJECTIVE:  SUBJECTIVE STATEMENT: Pt reports that the dry needling with e-stim really helped and the benefits lasted until that evening.  Pt reports that she has made at least 25-30% improvements since starting PT.  Pt present with a friend, Diana Schultz during session.  PERTINENT HISTORY:  Adrenal incidentaloma, HTN, anemia, pre-diabeties 1st nerve block 40-50% relief   PAIN:  Are you having pain? Yes: NPRS scale: 8-10/10 Pain location: low back  radiating to right leg down to lateral knee Pain description: sharp Aggravating factors: walking, movement Relieving factors: hot shower  PRECAUTIONS: None  WEIGHT BEARING RESTRICTIONS: No  FALLS:  Has patient fallen in last 6 months? No  LIVING ENVIRONMENT: Lives with: lives with their spouse Lives in: House/apartment Stairs: Yes: Internal: 12 steps; on right going up Has following equipment at home: None  OCCUPATION: Previously worked as a Lawyer  PLOF: Independent and Leisure: walking 2 shih-tzus  PATIENT GOALS: To decrease pain.  NEXT MD VISIT: Dr Marcell Barlow on 02/03/2023 for MRI follow-up  OBJECTIVE:   DIAGNOSTIC FINDINGS:  Lumbar CT on 10/28/2022 IMPRESSION: 1. Severe lower lumbar facet arthrosis with grade 1 anterolisthesis at L4 and L5 L4-5. 2. Potentially symptomatic right greater than left lateral recess and neural foraminal stenosis at L4-5. 3. Moderate neural foraminal stenosis at L3-4 and L5-S1.  Lumbar MRI on 01/15/2023: IMPRESSION: 1. Multilevel lumbar spondylosis as described above. 10 mm partially calcified synovial cyst in the right lateral recess at L4-L5 with impingement of the descending right L5 nerve root. The cyst is not amenable to percutaneous aspiration or rupture.  PATIENT SURVEYS:  Eval:  FOTO 22 (projected 51 by visit 14)  SCREENING FOR RED FLAGS: Bowel or bladder incontinence: No Spinal tumors: No Cauda equina syndrome: No Compression fracture: No Abdominal aneurysm: No  COGNITION: Overall cognitive status: Within functional limits for tasks assessed     SENSATION: WFL  MUSCLE LENGTH: Muscle restrictions present  Could not extend knees to end range in seated position   POSTURE: rounded shoulders, forward head, decreased lumbar lordosis, posterior pelvic tilt, and weight shift left  PALPATION: Patient had tenderness along medial aspect of right knee that triggered concordant pain to back (L3) , tenderness on right lateral hip,  right glute medius   LUMBAR ROM:   12/28/2022: Flexion 24 degrees with pain Extension 10 degrees with sharp pain and wincing Limited at least 75% with side bending with increased pain noted to right side, some pain to left side (reports more of SI joint pain region)  LOWER EXTREMITY ROM:    Global restrictions limited by 50%  LOWER EXTREMITY MMT:   12/28/2022:   Hip flexion: R 4-/5 limited by pain, L unable to perform limited by pain  Manual muscle testing terminated due to pain   LUMBAR SPECIAL TESTS:  Trendelenburg sign: Positive on left side  FUNCTIONAL TESTS:  12/28/2022: Timed up and go (TUG): 22.63 sec 30 seconds chair stand test:  2 stands  GAIT: Distance walked: 40ft Assistive device utilized: None Level of assistance: Modified independence Comments: Patient relied on contacting objects around her while ambulating (following along the wall) for stability, antalgic gait, trunk lean to left, right hip hike to clear right foot off ground   TODAY'S TREATMENT:      DATE: 01/21/2023  Nustep level 1 x2 min with PT present to discuss status Seated hip adduction ball squeeze 2x10 Seated TA contraction with pressing ball into thighs 2x10 Seated hamstring stretch 3x20 sec bilat (with cuing for improved technique and to no overstretch  to the point of pain) Review of log roll for supine to/from sit Supine lower trunk rotation x10 bilat Supine TA contraction with hands on thighs x10 bilat Trigger Point Dry-Needling  Treatment instructions: Expect mild to moderate muscle soreness. S/S of pneumothorax if dry needled over a lung field, and to seek immediate medical attention should they occur. Patient verbalized understanding of these instructions and education. Patient Consent Given: Yes Education handout provided: Previously provided Muscles treated: bilateral lumbar multifidi Electrical stimulation performed: Yes Parameters:  80 miliamps, with intensity set to tolerance x10 min to  bilateral lumbar multifidi Treatment response/outcome: Utilized skilled palpation to identify bony landmarks and trigger points.  Able to illicit twitch response and muscle elongation.  Soft tissue mobilization following to further promote tissue elongation.      DATE: 01/11/2023  Patient education of referred/radicular pain and discussion of upcoming MRI and ESI Discussed next week's pool visit and limiting time in the water since the transition out of the water was so difficult Trigger Point Dry-Needling  Treatment instructions: Expect mild to moderate muscle soreness. S/S of pneumothorax if dry needled over a lung field, and to seek immediate medical attention should they occur. Patient verbalized understanding of these instructions and education.  Patient Consent Given: Yes Education handout provided: Previously provided Muscles treated: in sidelying: right gluteals, right ITB Electrical stimulation performed: Yes Parameters: 15 min to right gluteals and ITB 1.5 ma 80 pps Treatment response/outcome: following treatment pt rises with ease and demonstrates much improved hip, knee and trunk ROM                 DATE: 01/07/2023  Discussed symptoms and positions of less pain Discussed strategy of left sidelying over pillow to open and decompress nerve root Left Sidelying over pillow with pillow b/w legs: ES IFC to right lumbar, lateral hip, just above the knee with heat 12 ma 12 min Patient given info on home TENs unit, where to purchase     PATIENT EDUCATION:  Education details: Intro to aquatic therapy ; Issued list of pools  Person educated: Patient Education method: Explanation, Demonstration, and Handouts Education comprehension: verbalized understanding and returned demonstration  HOME EXERCISE PROGRAM: Access Code: [email or web address] URL: [email or web address] Date: 12/28/2022 Prepared by: Clydie Braun Danett Palazzo  Exercises - Standing Transverse Abdominis Contraction  - 1 x  daily - 7 x weekly - 2 sets - 10 reps - Seated Hamstring Stretch  - 1 x daily - 7 x weekly - 1 sets - 2 reps - 20 sec hold - Supine Transversus Abdominis Bracing - Hands on Thighs  - 1 x daily - 7 x weekly - 2 sets - 10 reps - Supine Lower Trunk Rotation  - 1 x daily - 7 x weekly - 1 sets - 10 reps - Supine Piriformis Stretch with Foot on Ground  - 1 x daily - 7 x weekly - 1 sets - 2 reps - 20 sec hold  ASSESSMENT:  CLINICAL IMPRESSION: Diana Schultz presents to skilled PT reporting that she continues to have pain between 8-10/10.  During the session, able to review HEP with patient and provide cuing, as needed.  Patient able to progress with exercises during session.  Pt educated about the importance of core stability and strengthening to allow for decreased strain on lumbar paraspinals, she verbalizes understanding.  Patient overall states that least 25% improvement since starting skilled PT and has a follow up with her neurosurgeon on 02/03/2023.  OBJECTIVE IMPAIRMENTS: Abnormal gait, decreased activity tolerance, decreased  balance, decreased coordination, decreased endurance, decreased mobility, difficulty walking, decreased ROM, decreased strength, impaired flexibility, improper body mechanics, postural dysfunction, and pain.   ACTIVITY LIMITATIONS: carrying, lifting, bending, sitting, standing, squatting, sleeping, stairs, transfers, bed mobility, bathing, toileting, dressing, reach over head, locomotion level, and caring for others  PARTICIPATION LIMITATIONS: cleaning, laundry, interpersonal relationship, driving, community activity, and occupation  PERSONAL FACTORS: Age, Fitness, Profession, Time since onset of injury/illness/exacerbation, and 3+ comorbidities: HTN, pre-diabetes, lumbar spondylolisthesis  are also affecting patient's functional outcome.   REHAB POTENTIAL: Good  CLINICAL DECISION MAKING: Evolving/moderate complexity  EVALUATION COMPLEXITY: Moderate   GOALS: Goals reviewed  with patient? Yes  SHORT TERM GOALS: Target date: 01/21/2023  Pt will be independent with initial HEP. Baseline: Goal status: MET  2.  Patient will report at least a 25% improvement in symptoms with ADLs. Baseline:  Goal status: MET  3. Patient will demonstrate the proper log roll technique to protect the back from unnecessary spinal compression that could reproduce low back pain.   Baseline: patient reported intense pain with bed mobility   Goal status: MET on 01/21/2023     LONG TERM GOALS: Target date: 02/18/2023  Pt will be independent with advanced HEP to allow for self-progression following discharge. Baseline:  Goal status: INITIAL  2.  Patient will increase FOTO to at least 43 to demonstrate improvements with functional tasks. Baseline: 22% Goal status: INITIAL  3.  Patient will increase 30 sec sit to stand reps to 10 reps in order to perform work related duties requiring rising and lowering into a chair frequently.  Baseline: 2 reps Goal status: INITIAL  4.  Patient will decrease TUG time to no greater than 11 secs to no longer be at an increased likelihood of falls.  Baseline: 22.63 sec Goal status: INITIAL  5.  Patient will demonstrate proper body biomechanics with lifting an object off the floor for decreased risk of LBP reproduction during work related activities.  Baseline:  Goal status: INITIAL  6.  Patient will demonstrate no increased pain with driving more than 30mins required for travel to and from clients for work.  Baseline:  Goal status: INITIAL  PLAN:  PT FREQUENCY: 2x/week  PT DURATION: 8 weeks  PLANNED INTERVENTIONS: Therapeutic exercises, Therapeutic activity, Neuromuscular re-education, Balance training, Gait training, Patient/Family education, Self Care, Joint mobilization, Joint manipulation, Stair training, Aquatic Therapy, Dry Needling, Electrical stimulation, Spinal manipulation, Spinal mobilization, Cryotherapy, Moist heat, Taping,  Traction, Ultrasound, Ionotophoresis 4mg /ml Dexamethasone, Manual therapy, and Re-evaluation.  PLAN FOR NEXT SESSION: add DN with ES to lumbar paraspinals as needed;  increase mobility as pain level decreases, core stability; aquatic therapy  Reather Laurence, PT, DPT 01/21/23, 10:11 AM  [facility] [address], [address] [place], [place] [address] Phone # [phone] Fax [phone]    PHYSICAL THERAPY DISCHARGE SUMMARY  As of 07/25/2023, patient has not returned for further visits and discharged from PT at this time.  Please see above for most recent PT update.  Clydie Braun Prophet Renwick, PT, DPT 07/25/23, 10:18 AM

## 2023-01-24 DIAGNOSIS — D509 Iron deficiency anemia, unspecified: Secondary | ICD-10-CM | POA: Diagnosis not present

## 2023-01-24 DIAGNOSIS — I152 Hypertension secondary to endocrine disorders: Secondary | ICD-10-CM | POA: Diagnosis not present

## 2023-01-24 DIAGNOSIS — E1169 Type 2 diabetes mellitus with other specified complication: Secondary | ICD-10-CM | POA: Diagnosis not present

## 2023-01-24 DIAGNOSIS — E7849 Other hyperlipidemia: Secondary | ICD-10-CM | POA: Diagnosis not present

## 2023-01-24 DIAGNOSIS — G8929 Other chronic pain: Secondary | ICD-10-CM | POA: Diagnosis not present

## 2023-01-25 ENCOUNTER — Ambulatory Visit: Payer: BC Managed Care – PPO | Admitting: Rehabilitative and Restorative Service Providers"

## 2023-01-27 ENCOUNTER — Ambulatory Visit (HOSPITAL_BASED_OUTPATIENT_CLINIC_OR_DEPARTMENT_OTHER): Payer: BC Managed Care – PPO | Admitting: Physical Therapy

## 2023-01-31 ENCOUNTER — Telehealth: Payer: Self-pay | Admitting: Neurosurgery

## 2023-01-31 NOTE — Telephone Encounter (Signed)
Pt called in stated that she has canceled all further PT treatments per her PCP's recommendation. Her PCP said she did not believe Diana Schultz should continue the PT due to the cyst in her back and the pain and trouble its been causing her. She states that the dry needling made her pain worse and the water treatments made her pain so bad it made her sick to her stomach.  Wants to discuss other options at her appt scheduled for 8/15. I did offer her the opening for tomorrow 8/13 @ 9:30 but she declined due to transportation issues.

## 2023-02-01 ENCOUNTER — Ambulatory Visit (HOSPITAL_BASED_OUTPATIENT_CLINIC_OR_DEPARTMENT_OTHER): Payer: BC Managed Care – PPO | Admitting: Physical Therapy

## 2023-02-03 ENCOUNTER — Ambulatory Visit: Payer: BC Managed Care – PPO | Admitting: Neurosurgery

## 2023-02-03 ENCOUNTER — Other Ambulatory Visit: Payer: Self-pay

## 2023-02-03 ENCOUNTER — Ambulatory Visit (INDEPENDENT_AMBULATORY_CARE_PROVIDER_SITE_OTHER): Payer: BC Managed Care – PPO | Admitting: Neurosurgery

## 2023-02-03 ENCOUNTER — Encounter: Payer: Self-pay | Admitting: Neurosurgery

## 2023-02-03 ENCOUNTER — Encounter
Admission: RE | Admit: 2023-02-03 | Discharge: 2023-02-03 | Disposition: A | Discharge status: Home / Self Care | Payer: BC Managed Care – PPO | Source: Ambulatory Visit | Attending: Neurosurgery | Admitting: Neurosurgery

## 2023-02-03 VITALS — BP 130/78 | Ht 63.5 in | Wt 248.0 lb

## 2023-02-03 DIAGNOSIS — M7138 Other bursal cyst, other site: Secondary | ICD-10-CM | POA: Diagnosis not present

## 2023-02-03 DIAGNOSIS — I10 Essential (primary) hypertension: Secondary | ICD-10-CM | POA: Diagnosis not present

## 2023-02-03 DIAGNOSIS — E662 Morbid (severe) obesity with alveolar hypoventilation: Secondary | ICD-10-CM | POA: Diagnosis not present

## 2023-02-03 DIAGNOSIS — I150 Renovascular hypertension: Secondary | ICD-10-CM | POA: Insufficient documentation

## 2023-02-03 DIAGNOSIS — Z6841 Body Mass Index (BMI) 40.0 and over, adult: Secondary | ICD-10-CM | POA: Insufficient documentation

## 2023-02-03 DIAGNOSIS — R7303 Prediabetes: Secondary | ICD-10-CM | POA: Diagnosis not present

## 2023-02-03 DIAGNOSIS — M5416 Radiculopathy, lumbar region: Secondary | ICD-10-CM

## 2023-02-03 DIAGNOSIS — M4726 Other spondylosis with radiculopathy, lumbar region: Secondary | ICD-10-CM | POA: Diagnosis not present

## 2023-02-03 DIAGNOSIS — Z01818 Encounter for other preprocedural examination: Secondary | ICD-10-CM

## 2023-02-03 DIAGNOSIS — Z01810 Encounter for preprocedural cardiovascular examination: Secondary | ICD-10-CM

## 2023-02-03 DIAGNOSIS — Z01812 Encounter for preprocedural laboratory examination: Secondary | ICD-10-CM

## 2023-02-03 LAB — TYPE AND SCREEN
ABO/RH(D): AB POS
Antibody Screen: NEGATIVE

## 2023-02-03 LAB — URINALYSIS, COMPLETE (UACMP) WITH MICROSCOPIC
Bacteria, UA: NONE SEEN
Bilirubin Urine: NEGATIVE
Glucose, UA: NEGATIVE mg/dL
Hgb urine dipstick: NEGATIVE
Ketones, ur: NEGATIVE mg/dL
Leukocytes,Ua: NEGATIVE
Nitrite: NEGATIVE
Protein, ur: NEGATIVE mg/dL
Specific Gravity, Urine: 1.011 (ref 1.005–1.030)
pH: 5 (ref 5.0–8.0)

## 2023-02-03 LAB — CBC
HCT: 39.7 % (ref 36.0–46.0)
Hemoglobin: 13.1 g/dL (ref 12.0–15.0)
MCH: 27.8 pg (ref 26.0–34.0)
MCHC: 33 g/dL (ref 30.0–36.0)
MCV: 84.1 fL (ref 80.0–100.0)
Platelets: 220 10*3/uL (ref 150–400)
RBC: 4.72 MIL/uL (ref 3.87–5.11)
RDW: 14.2 % (ref 11.5–15.5)
WBC: 13.5 10*3/uL — ABNORMAL HIGH (ref 4.0–10.5)
nRBC: 0 % (ref 0.0–0.2)

## 2023-02-03 LAB — BASIC METABOLIC PANEL
Anion gap: 9 (ref 5–15)
BUN: 26 mg/dL — ABNORMAL HIGH (ref 6–20)
CO2: 26 mmol/L (ref 22–32)
Calcium: 9.7 mg/dL (ref 8.9–10.3)
Chloride: 101 mmol/L (ref 98–111)
Creatinine, Ser: 0.7 mg/dL (ref 0.44–1.00)
GFR, Estimated: >60 mL/min (ref >60)
Glucose, Bld: 96 mg/dL (ref 70–99)
Potassium: 3.8 mmol/L (ref 3.5–5.1)
Sodium: 136 mmol/L (ref 135–145)

## 2023-02-03 LAB — SURGICAL PCR SCREEN
MRSA, PCR: NEGATIVE
Staphylococcus aureus: NEGATIVE

## 2023-02-03 NOTE — Patient Instructions (Addendum)
Please see below for information in regards to your upcoming surgery:  *Your husband should stay home with you through Sunday *You should not walk your dogs for 6 weeks after surgery  Planned surgery: Right L4-5 laminoforaminotomy   Surgery date: 02/09/23 at [facility] (Medical Mall: [address], [place], [place] [address]) - you will find out your arrival time the business day before your surgery.   Pre-op appointment at [facility] Pre-admit Testing: If you are scheduled for an in person appointment, Pre-admit Testing is located on the first floor of the [facility] building, [address], [address]. Please bring all prescriptions in the original prescription bottles to your appointment, even if you have reviewed medications by phone with a pharmacy representative. During this appointment, they will advise you which medications you can take the morning of surgery, and which medications you will need to hold for surgery. Pre-op labs may be done at your pre-op appointment. You are not required to fast for these labs. Should you need to change your pre-op appointment, please call Pre-admit testing at [phone].     Diabetes medications:  Mounjaro: hold 7 days prior to surgery    Surgical clearance: we will send a clearance form to Dr Kristen Loader      Common restrictions after surgery: No bending, lifting, or twisting ("BLT"). Avoid lifting objects heavier than 10 pounds for the first 6 weeks after surgery. Where possible, avoid household activities that involve lifting, bending, reaching, pushing, or pulling such as laundry, vacuuming, grocery shopping, and childcare. Try to arrange for help from friends and family for these activities while you heal. Do not drive while taking prescription pain medication. Weeks 6 through 12 after surgery: avoid lifting more than 25 pounds.    How to contact us:  If you have any questions/concerns before or after  surgery, you can reach [place] at [phone], or you can send a mychart message. We can be reached by phone or mychart 8am-4pm, Monday-Friday.  *Please note: Calls after 4pm are forwarded to a third party answering service. Mychart messages are not routinely monitored during evenings, weekends, and holidays. Please call our office to contact the answering service for urgent concerns during non-business hours.     If you have FMLA/disability paperwork, please drop it off or fax it to [phone], attention Patty.   Appointments/FMLA & disability paperwork: Patty & Cristin  Nurse: Royston Cowper  Medical assistants: Laurann Montana, & Lyla Son Physician Assistants: Manning Charity & Drake Leach Surgeons: Venetia Night, MD & Ernestine Mcmurray, MD

## 2023-02-03 NOTE — H&P (View-Only) (Signed)
 Referring Physician:  Laqueta Due., MD [address] [place],  [place] [address]  Primary Physician:  Laqueta Due., MD  History of Present Illness: 02/03/2023 Diana Schultz returns to see me.  Since I last saw her, she has had an MRI scan.  Her back and leg pain has gotten worse.  She feels like she is having some weakness in her right leg.  She has also had some trouble when she has had difficulty making it to the restroom when she needs to urinate.  She has increasing numbness and pain.  She has tried physical therapy, but her symptoms have been worsening.  Physical therapy has been worsening her symptoms.  12/14/2022 Diana Schultz is here today with a chief complaint of back and right leg pain.  Her pain has been going down her right leg and is causing substantial debility for her.  All activities make it worse.  Aleve has helped.  She gets pain into her lower back that radiates into her right buttock, anterior lateral thigh, and anterior lateral shin.  Her pain is constant.  She tried physical therapy in March, but was unable to do many of the exercises. Bowel/Bladder Dysfunction: none  Conservative measures:  Physical therapy:  denies  Multimodal medical therapy including regular antiinflammatories:  aleve, gabapentin,moxicam Injections:  12/07/22 medial branch blocks at L4/5, diagnostic (approximately 50% relief in her lower back pain )  11/10/22 right  at L4-L5 (no help)   Past Surgery: none  Diana Schultz has no symptoms of cervical myelopathy.  The symptoms are causing a significant impact on the patient's life.   I have utilized the care everywhere function in epic to review the outside records available from external health systems.  Review of Systems:  A 10 point review of systems is negative, except for the pertinent positives and negatives detailed in the HPI.  Past Medical History: Past Medical History:  Diagnosis Date   Adrenal adenoma    Anemia     Arteriovenous malformation    Depression    Diabetes mellitus without complication (HCC)    borderline - no meds   Diverticulitis    DVT (deep venous thrombosis) (HCC)    Hx at age 30 blood clot in right leg, d/c birth control pills and no other problems   Fatty liver    Gallbladder disease    Hypertension    history,lost weight, controlled with diet and exercise, no current med   Lactose intolerance    Morbid obesity (HCC)    Ovarian cyst    Prediabetes    Thalassemia    Umbilical hernia    Vaginal delivery 1984, 1989   Vitamin D deficiency     Past Surgical History: Past Surgical History:  Procedure Laterality Date   ABDOMINAL HYSTERECTOMY     ABLATION  03/23/13   [facility]   APPENDECTOMY     CHOLECYSTECTOMY     DILATION AND CURETTAGE OF UTERUS     DILATION AND CURETTAGE OF UTERUS N/A 11/01/2013   Procedure: DILATATION AND CURETTAGE;  Surgeon: Catalina Antigua, MD;  Location: [facility];  Service: Gynecology;  Laterality: N/A;   DILITATION & CURRETTAGE/HYSTROSCOPY WITH NOVASURE ABLATION N/A 03/23/2013   Procedure: DILATATION & CURETTAGE/HYSTEROSCOPY WITH NOVASURE ABLATION;  Surgeon: Willodean Rosenthal, MD;  Location: [facility];  Service: Gynecology;  Laterality: N/A;   LEEP     TUBAL LIGATION     VAGINAL HYSTERECTOMY N/A 12/31/2013   Procedure: HYSTERECTOMY  VAGINAL;  Surgeon: Willodean Rosenthal, MD;  Location: [facility];  Service: Gynecology;  Laterality: N/A;    Allergies: Allergies as of 02/03/2023 - Review Complete 01/21/2023  Allergen Reaction Noted   Amlodipine Cough and Swelling 02/28/2018   Metoprolol Swelling 06/03/2017   Benicar [olmesartan] Cough 10/24/2013   Hydrochlorothiazide  06/03/2017   Lactose intolerance (gi)  09/25/2019   Lisinopril Cough 06/18/2011   Nebivolol  06/03/2017   Semaglutide(0.25 or 0.5mg -dos) Itching 09/25/2019    Medications:  Current Outpatient Medications:    furosemide (LASIX) 20 MG tablet, Take 1 tablet (20 mg total) by mouth daily.,  Disp: 30 tablet, Rfl: 3   naltrexone (DEPADE) 50 MG tablet, Take 1/2 tablet by mouth daily, Disp: 15 tablet, Rfl: 0   potassium chloride (KLOR-CON 10) 10 MEQ tablet, Take 1 tablet by mouth along with lasix. Take with food., Disp: 30 tablet, Rfl: 3   telmisartan (MICARDIS) 80 MG tablet, Take 1 tablet (80 mg total) by mouth daily., Disp: 30 tablet, Rfl: 3   tirzepatide (MOUNJARO) 15 MG/0.5ML Pen, Inject 15 mg into the skin once a week., Disp: 2 mL, Rfl: 2  Social History: Social History   Tobacco Use   Smoking status: Never   Smokeless tobacco: Never  Vaping Use   Vaping status: Never Used  Substance Use Topics   Alcohol use: No    Comment: rare    Drug use: No    Family Medical History: Family History  Problem Relation Age of Onset   Cancer Mother    Hypertension Mother    Diabetes Mother    Stroke Mother        early in life   Endometrial cancer Mother    Hyperlipidemia Mother    Depression Mother    Mental retardation Father    Breast cancer Father    Depression Father    Hypertension Sister    Hypertension Brother    Heart failure Paternal Grandfather    CAD Other        GP, late in like   Colon cancer Neg Hx     Physical Examination: There were no vitals filed for this visit.   General: Patient is in no apparent distress. Attention to examination is appropriate.  Neck:   Supple.  Full range of motion.  Respiratory: Patient is breathing without any difficulty.   NEUROLOGICAL:     Awake, alert, oriented to person, place, and time.  Speech is clear and fluent.   Cranial Nerves: Pupils equal round and reactive to light.  Facial tone is symmetric.  Facial sensation is symmetric. Shoulder shrug is symmetric. Tongue protrusion is midline.  There is no pronator drift.  Strength: Side Biceps Triceps Deltoid Interossei Grip Wrist Ext. Wrist Flex.  R 5 5 5 5 5 5 5   L 5 5 5 5 5 5 5    Side Iliopsoas Quads Hamstring PF DF EHL  R 5 5 5 5 4  4-  L 5 5 5 5 5 5     Reflexes are 1+ and symmetric at the biceps, triceps, brachioradialis, patella and achilles.   Hoffman's is absent.   Bilateral upper and lower extremity sensation is intact to light touch.    No evidence of dysmetria noted.  Gait is antalgic.  SLR positive at 30 degrees on R   Medical Decision Making  Imaging: CT L spine 10/28/2022 IMPRESSION: 1. Severe lower lumbar facet arthrosis with grade 1 anterolisthesis at L4 and L5 L4-5. 2. Potentially symptomatic right greater  than left lateral recess and neural foraminal stenosis at L4-5. 3. Moderate neural foraminal stenosis at L3-4 and L5-S1.     Electronically Signed   By: Sebastian Ache M.D.   On: 11/02/2022 12:41  MRI L spine 01/15/2023 IMPRESSION: 1. Multilevel lumbar spondylosis as described above. 10 mm partially calcified synovial cyst in the right lateral recess at L4-L5 with impingement of the descending right L5 nerve root. The cyst is not amenable to percutaneous aspiration or rupture.     Electronically Signed   By: Obie Dredge M.D.   On: 01/19/2023 13:17  I have personally reviewed the images and agree with the above interpretation.  Assessment and Plan: Diana Schultz is a pleasant 57 y.o. female with chronic back pain with right-sided sciatica.  She has lumbar radiculopathy consistent with right L5 involvement.  On her MRI scan, there is a synovial cyst that causes severe right lateral recess compression at L4-5.  Her symptoms have worsened since I last saw her.  She now has weakness in an L5 distribution.  At this point, it is no longer indicated to consider conservative management.  I have recommended moving forward with a right-sided laminoforaminotomy for decompression of the right L5 nerve root.    I discussed the planned procedure at length with the patient, including the risks, benefits, alternatives, and indications. The risks discussed include but are not limited to bleeding, infection, need for  reoperation, spinal fluid leak, stroke, vision loss, anesthetic complication, coma, paralysis, and even death. I also described in detail that improvement was not guaranteed.  The patient expressed understanding of these risks, and asked that we proceed with surgery. I described the surgery in layman's terms, and gave ample opportunity for questions, which were answered to the best of my ability.  Thank you for involving me in the care of this patient.      Chester K. Myer Haff MD, [facility]

## 2023-02-03 NOTE — Progress Notes (Addendum)
Referring Physician:  Laqueta Due., MD [address] [place],  [place] [address]  Primary Physician:  Laqueta Due., MD  History of Present Illness: 02/03/2023 Diana Schultz returns to see me.  Since I last saw her, she has had an MRI scan.  Her back and leg pain has gotten worse.  She feels like she is having some weakness in her right leg.  She has also had some trouble when she has had difficulty making it to the restroom when she needs to urinate.  She has increasing numbness and pain.  She has tried physical therapy, but her symptoms have been worsening.  Physical therapy has been worsening her symptoms.  12/14/2022 Diana Schultz is here today with a chief complaint of back and right leg pain.  Her pain has been going down her right leg and is causing substantial debility for her.  All activities make it worse.  Aleve has helped.  She gets pain into her lower back that radiates into her right buttock, anterior lateral thigh, and anterior lateral shin.  Her pain is constant.  She tried physical therapy in March, but was unable to do many of the exercises. Bowel/Bladder Dysfunction: none  Conservative measures:  Physical therapy:  denies  Multimodal medical therapy including regular antiinflammatories:  aleve, gabapentin,moxicam Injections:  12/07/22 medial branch blocks at L4/5, diagnostic (approximately 50% relief in her lower back pain )  11/10/22 right  at L4-L5 (no help)   Past Surgery: none  Diana Schultz has no symptoms of cervical myelopathy.  The symptoms are causing a significant impact on the patient's life.   I have utilized the care everywhere function in epic to review the outside records available from external health systems.  Review of Systems:  A 10 point review of systems is negative, except for the pertinent positives and negatives detailed in the HPI.  Past Medical History: Past Medical History:  Diagnosis Date   Adrenal adenoma    Anemia     Arteriovenous malformation    Depression    Diabetes mellitus without complication (HCC)    borderline - no meds   Diverticulitis    DVT (deep venous thrombosis) (HCC)    Hx at age 30 blood clot in right leg, d/c birth control pills and no other problems   Fatty liver    Gallbladder disease    Hypertension    history,lost weight, controlled with diet and exercise, no current med   Lactose intolerance    Morbid obesity (HCC)    Ovarian cyst    Prediabetes    Thalassemia    Umbilical hernia    Vaginal delivery 1984, 1989   Vitamin D deficiency     Past Surgical History: Past Surgical History:  Procedure Laterality Date   ABDOMINAL HYSTERECTOMY     ABLATION  03/23/13   [facility]   APPENDECTOMY     CHOLECYSTECTOMY     DILATION AND CURETTAGE OF UTERUS     DILATION AND CURETTAGE OF UTERUS N/A 11/01/2013   Procedure: DILATATION AND CURETTAGE;  Surgeon: Catalina Antigua, MD;  Location: [facility];  Service: Gynecology;  Laterality: N/A;   DILITATION & CURRETTAGE/HYSTROSCOPY WITH NOVASURE ABLATION N/A 03/23/2013   Procedure: DILATATION & CURETTAGE/HYSTEROSCOPY WITH NOVASURE ABLATION;  Surgeon: Willodean Rosenthal, MD;  Location: [facility];  Service: Gynecology;  Laterality: N/A;   LEEP     TUBAL LIGATION     VAGINAL HYSTERECTOMY N/A 12/31/2013   Procedure: HYSTERECTOMY  VAGINAL;  Surgeon: Willodean Rosenthal, MD;  Location: [facility];  Service: Gynecology;  Laterality: N/A;    Allergies: Allergies as of 02/03/2023 - Review Complete 01/21/2023  Allergen Reaction Noted   Amlodipine Cough and Swelling 02/28/2018   Metoprolol Swelling 06/03/2017   Benicar [olmesartan] Cough 10/24/2013   Hydrochlorothiazide  06/03/2017   Lactose intolerance (gi)  09/25/2019   Lisinopril Cough 06/18/2011   Nebivolol  06/03/2017   Semaglutide(0.25 or 0.5mg -dos) Itching 09/25/2019    Medications:  Current Outpatient Medications:    furosemide (LASIX) 20 MG tablet, Take 1 tablet (20 mg total) by mouth daily.,  Disp: 30 tablet, Rfl: 3   naltrexone (DEPADE) 50 MG tablet, Take 1/2 tablet by mouth daily, Disp: 15 tablet, Rfl: 0   potassium chloride (KLOR-CON 10) 10 MEQ tablet, Take 1 tablet by mouth along with lasix. Take with food., Disp: 30 tablet, Rfl: 3   telmisartan (MICARDIS) 80 MG tablet, Take 1 tablet (80 mg total) by mouth daily., Disp: 30 tablet, Rfl: 3   tirzepatide (MOUNJARO) 15 MG/0.5ML Pen, Inject 15 mg into the skin once a week., Disp: 2 mL, Rfl: 2  Social History: Social History   Tobacco Use   Smoking status: Never   Smokeless tobacco: Never  Vaping Use   Vaping status: Never Used  Substance Use Topics   Alcohol use: No    Comment: rare    Drug use: No    Family Medical History: Family History  Problem Relation Age of Onset   Cancer Mother    Hypertension Mother    Diabetes Mother    Stroke Mother        early in life   Endometrial cancer Mother    Hyperlipidemia Mother    Depression Mother    Mental retardation Father    Breast cancer Father    Depression Father    Hypertension Sister    Hypertension Brother    Heart failure Paternal Grandfather    CAD Other        GP, late in like   Colon cancer Neg Hx     Physical Examination: There were no vitals filed for this visit.   General: Patient is in no apparent distress. Attention to examination is appropriate.  Neck:   Supple.  Full range of motion.  Respiratory: Patient is breathing without any difficulty.   NEUROLOGICAL:     Awake, alert, oriented to person, place, and time.  Speech is clear and fluent.   Cranial Nerves: Pupils equal round and reactive to light.  Facial tone is symmetric.  Facial sensation is symmetric. Shoulder shrug is symmetric. Tongue protrusion is midline.  There is no pronator drift.  Strength: Side Biceps Triceps Deltoid Interossei Grip Wrist Ext. Wrist Flex.  R 5 5 5 5 5 5 5   L 5 5 5 5 5 5 5    Side Iliopsoas Quads Hamstring PF DF EHL  R 5 5 5 5 4  4-  L 5 5 5 5 5 5     Reflexes are 1+ and symmetric at the biceps, triceps, brachioradialis, patella and achilles.   Hoffman's is absent.   Bilateral upper and lower extremity sensation is intact to light touch.    No evidence of dysmetria noted.  Gait is antalgic.  SLR positive at 30 degrees on R   Medical Decision Making  Imaging: CT L spine 10/28/2022 IMPRESSION: 1. Severe lower lumbar facet arthrosis with grade 1 anterolisthesis at L4 and L5 L4-5. 2. Potentially symptomatic right greater  than left lateral recess and neural foraminal stenosis at L4-5. 3. Moderate neural foraminal stenosis at L3-4 and L5-S1.     Electronically Signed   By: Sebastian Ache M.D.   On: 11/02/2022 12:41  MRI L spine 01/15/2023 IMPRESSION: 1. Multilevel lumbar spondylosis as described above. 10 mm partially calcified synovial cyst in the right lateral recess at L4-L5 with impingement of the descending right L5 nerve root. The cyst is not amenable to percutaneous aspiration or rupture.     Electronically Signed   By: Obie Dredge M.D.   On: 01/19/2023 13:17  I have personally reviewed the images and agree with the above interpretation.  Assessment and Plan: Diana Schultz is a pleasant 57 y.o. female with chronic back pain with right-sided sciatica.  She has lumbar radiculopathy consistent with right L5 involvement.  On her MRI scan, there is a synovial cyst that causes severe right lateral recess compression at L4-5.  Her symptoms have worsened since I last saw her.  She now has weakness in an L5 distribution.  At this point, it is no longer indicated to consider conservative management.  I have recommended moving forward with a right-sided laminoforaminotomy for decompression of the right L5 nerve root.    I discussed the planned procedure at length with the patient, including the risks, benefits, alternatives, and indications. The risks discussed include but are not limited to bleeding, infection, need for  reoperation, spinal fluid leak, stroke, vision loss, anesthetic complication, coma, paralysis, and even death. I also described in detail that improvement was not guaranteed.  The patient expressed understanding of these risks, and asked that we proceed with surgery. I described the surgery in layman's terms, and gave ample opportunity for questions, which were answered to the best of my ability.  Thank you for involving me in the care of this patient.       K. Myer Haff MD, [facility]

## 2023-02-03 NOTE — Addendum Note (Signed)
Addended by: Sharlot Gowda on: 02/03/2023 10:17 AM   Modules accepted: Orders

## 2023-02-04 ENCOUNTER — Encounter: Payer: BC Managed Care – PPO | Admitting: Rehabilitative and Restorative Service Providers"

## 2023-02-04 ENCOUNTER — Encounter: Payer: Self-pay | Admitting: Urgent Care

## 2023-02-07 ENCOUNTER — Encounter
Admission: RE | Admit: 2023-02-07 | Discharge: 2023-02-07 | Disposition: A | Discharge status: Home / Self Care | Payer: BC Managed Care – PPO | Source: Ambulatory Visit | Attending: Neurosurgery | Admitting: Neurosurgery

## 2023-02-07 ENCOUNTER — Other Ambulatory Visit: Payer: Self-pay

## 2023-02-07 HISTORY — DX: Asymptomatic menopausal state: Z78.0

## 2023-02-07 HISTORY — DX: Deep dyspareunia: N94.12

## 2023-02-07 HISTORY — DX: Sleep apnea, unspecified: G47.30

## 2023-02-07 HISTORY — DX: Obesity, class 3: E66.813

## 2023-02-07 HISTORY — DX: Morbid (severe) obesity with alveolar hypoventilation: E66.2

## 2023-02-07 HISTORY — DX: Other chronic pain: G89.29

## 2023-02-07 HISTORY — DX: Unilateral primary osteoarthritis, left hip: M16.12

## 2023-02-07 HISTORY — DX: Body Mass Index (BMI) 40.0 and over, adult: Z684

## 2023-02-07 NOTE — Patient Instructions (Addendum)
Your procedure is scheduled on: Wednesday August 21 Report to the Registration Desk on the 1st floor of the [facility] To find out your arrival time, please call [phone] between 1PM - 3PM on:  Tuesday   August 20 If your arrival time is 6:00 am, do not arrive before that time as the [facility] entrance doors do not open until 6:00 am.  REMEMBER: Instructions that are not followed completely may result in serious medical risk, up to and including death; or upon the discretion of your surgeon and anesthesiologist your surgery may need to be rescheduled.  Do not eat food after midnight the night before surgery.  No gum chewing or hard candies.  You may however, drink CLEAR liquids up to 2 hours before you are scheduled to arrive for your surgery. Do not drink anything within 2 hours of your scheduled arrival time.  Clear liquids include: - water  - apple juice without pulp - gatorade (not RED colors) - black coffee or tea (Do NOT add milk or creamers to the coffee or tea) Do NOT drink anything that is not on this list.  One week prior to surgery: Stop Anti-inflammatories (NSAIDS) such as Advil, Aleve, Ibuprofen, Motrin, Naproxen, Naprosyn and Aspirin based products such as Excedrin, Goody's Powder, BC Powder. Stop ANY OVER THE COUNTER supplements until after surgery. potassium chloride (KLOR-CON 10)  You may however, continue to take Tylenol if needed for pain up until the day of surgery.  Continue taking all prescribed medications with the exception of the following: furosemide (LASIX) hold the day of surgery, last dose Tuesday August 20  telmisartan (MICARDIS) hold the day of surgery, last day Tuesday August 20  tirzepatide [facility]) hold 7 days prior to surgery, last dose Tuesday August 13  naltrexone (DEPADE) hold the day of surgery, last dose Tuesday August 20   DO NOT ANY MEDICATIONS THE MORNING OF SURGERY:  No Alcohol for 24 hours before or after surgery.  No  Smoking including e-cigarettes for 24 hours before surgery.  No chewable tobacco products for at least 6 hours before surgery.  No nicotine patches on the day of surgery.  Do not use any "recreational" drugs for at least a week (preferably 2 weeks) before your surgery.  Please be advised that the combination of cocaine and anesthesia may have negative outcomes, up to and including death. If you test positive for cocaine, your surgery will be cancelled.  On the morning of surgery brush your teeth with toothpaste and water, you may rinse your mouth with mouthwash if you wish. Do not swallow any toothpaste or mouthwash.  Use CHG Soap as directed on instruction sheet.  Do not wear jewelry, make-up, hairpins, clips or nail polish.  Do not wear lotions, powders, or perfumes.   Do not shave body hair from the neck down 48 hours before surgery.  Contact lenses, hearing aids and dentures may not be worn into surgery.  Do not bring valuables to the hospital. [facility] is not responsible for any missing/lost belongings or valuables.   Notify your doctor if there is any change in your medical condition (cold, fever, infection).  Wear comfortable clothing (specific to your surgery type) to the hospital.  After surgery, you can help prevent lung complications by doing breathing exercises.   If you are being discharged the day of surgery, you will not be allowed to drive home. You will need a responsible individual to drive you home and stay with you for  24 hours after surgery.   If you are taking public transportation, you will need to have a responsible individual with you.  Please call the Pre-admissions Testing Dept. at [phone] if you have any questions about these instructions.  Surgery Visitation Policy:  Patients having surgery or a procedure may have two visitors.  Children under the age of 40 must have an adult with them who is not the patient.        Pre-operative 5  CHG Bath Instructions   You can play a key role in reducing the risk of infection after surgery. Your skin needs to be as free of germs as possible. You can reduce the number of germs on your skin by washing with CHG (chlorhexidine gluconate) soap before surgery. CHG is an antiseptic soap that kills germs and continues to kill germs even after washing.   DO NOT use if you have an allergy to chlorhexidine/CHG or antibacterial soaps. If your skin becomes reddened or irritated, stop using the CHG and notify one of our RNs at [phone].   Please shower with the CHG soap starting 4 days before surgery using the following schedule:     Please keep in mind the following:  DO NOT shave, including legs and underarms, starting the day of your first shower.   You may shave your face at any point before/day of surgery.  Place clean sheets on your bed the day you start using CHG soap. Use a clean washcloth (not used since being washed) for each shower. DO NOT sleep with pets once you start using the CHG.   CHG Shower Instructions:  If you choose to wash your hair and private area, wash first with your normal shampoo/soap.  After you use shampoo/soap, rinse your hair and body thoroughly to remove shampoo/soap residue.  Turn the water OFF and apply about 3 tablespoons (45 ml) of CHG soap to a CLEAN washcloth.  Apply CHG soap ONLY FROM YOUR NECK DOWN TO YOUR TOES (washing for 3-5 minutes)  DO NOT use CHG soap on face, private areas, open wounds, or sores.  Pay special attention to the area where your surgery is being performed.  If you are having back surgery, having someone wash your back for you may be helpful. Wait 2 minutes after CHG soap is applied, then you may rinse off the CHG soap.  Pat dry with a clean towel  Put on clean clothes/pajamas   If you choose to wear lotion, please use ONLY the CHG-compatible lotions on the back of this paper.     Additional instructions for the day of  surgery: DO NOT APPLY any lotions, deodorants, cologne, or perfumes.   Put on clean/comfortable clothes.  Brush your teeth.  Ask your nurse before applying any prescription medications to the skin.      CHG Compatible Lotions   Aveeno Moisturizing lotion  Cetaphil Moisturizing Cream  Cetaphil Moisturizing Lotion  Clairol Herbal Essence Moisturizing Lotion, Dry Skin  Clairol Herbal Essence Moisturizing Lotion, Extra Dry Skin  Clairol Herbal Essence Moisturizing Lotion, Normal Skin  Curel Age Defying Therapeutic Moisturizing Lotion with Alpha Hydroxy  Curel Extreme Care Body Lotion  Curel Soothing Hands Moisturizing Hand Lotion  Curel Therapeutic Moisturizing Cream, Fragrance-Free  Curel Therapeutic Moisturizing Lotion, Fragrance-Free  Curel Therapeutic Moisturizing Lotion, Original Formula  Eucerin Daily Replenishing Lotion  Eucerin Dry Skin Therapy Plus Alpha Hydroxy Crme  Eucerin Dry Skin Therapy Plus Alpha Hydroxy Lotion  Eucerin Original Crme  Eucerin Original Lotion  Eucerin Plus Crme Eucerin Plus Lotion  Eucerin TriLipid Replenishing Lotion  Keri Anti-Bacterial Hand Lotion  Keri Deep Conditioning Original Lotion Dry Skin Formula Softly Scented  Keri Deep Conditioning Original Lotion, Fragrance Free Sensitive Skin Formula  Keri Lotion Fast Absorbing Fragrance Free Sensitive Skin Formula  Keri Lotion Fast Absorbing Softly Scented Dry Skin Formula  Keri Original Lotion  Keri Skin Renewal Lotion Keri Silky Smooth Lotion  Keri Silky Smooth Sensitive Skin Lotion  Nivea Body Creamy Conditioning Oil  Nivea Body Extra Enriched Teacher, adult education Moisturizing Lotion Nivea Crme  Nivea Skin Firming Lotion  NutraDerm 30 Skin Lotion  NutraDerm Skin Lotion  NutraDerm Therapeutic Skin Cream  NutraDerm Therapeutic Skin Lotion  ProShield Protective Hand Cream  Provon moisturizing lotion

## 2023-02-08 ENCOUNTER — Ambulatory Visit (HOSPITAL_BASED_OUTPATIENT_CLINIC_OR_DEPARTMENT_OTHER): Payer: BC Managed Care – PPO | Admitting: Physical Therapy

## 2023-02-09 ENCOUNTER — Other Ambulatory Visit: Payer: Self-pay

## 2023-02-09 ENCOUNTER — Encounter: Payer: Self-pay | Admitting: Neurosurgery

## 2023-02-09 ENCOUNTER — Ambulatory Visit: Payer: BC Managed Care – PPO | Admitting: Urgent Care

## 2023-02-09 ENCOUNTER — Ambulatory Visit: Payer: BC Managed Care – PPO

## 2023-02-09 ENCOUNTER — Encounter
Admission: RE | Disposition: A | Discharge status: Home / Self Care | Payer: Self-pay | Source: Home / Self Care | Attending: Neurosurgery

## 2023-02-09 ENCOUNTER — Ambulatory Visit
Admission: RE | Admit: 2023-02-09 | Discharge: 2023-02-09 | Disposition: A | Discharge status: Home / Self Care | Payer: BC Managed Care – PPO | Attending: Neurosurgery | Admitting: Neurosurgery

## 2023-02-09 DIAGNOSIS — Z4789 Encounter for other orthopedic aftercare: Secondary | ICD-10-CM | POA: Diagnosis not present

## 2023-02-09 DIAGNOSIS — Z86718 Personal history of other venous thrombosis and embolism: Secondary | ICD-10-CM | POA: Diagnosis not present

## 2023-02-09 DIAGNOSIS — R7303 Prediabetes: Secondary | ICD-10-CM

## 2023-02-09 DIAGNOSIS — M7138 Other bursal cyst, other site: Secondary | ICD-10-CM

## 2023-02-09 DIAGNOSIS — Z6841 Body Mass Index (BMI) 40.0 and over, adult: Secondary | ICD-10-CM | POA: Diagnosis not present

## 2023-02-09 DIAGNOSIS — G8929 Other chronic pain: Secondary | ICD-10-CM | POA: Insufficient documentation

## 2023-02-09 DIAGNOSIS — K760 Fatty (change of) liver, not elsewhere classified: Secondary | ICD-10-CM | POA: Insufficient documentation

## 2023-02-09 DIAGNOSIS — E662 Morbid (severe) obesity with alveolar hypoventilation: Secondary | ICD-10-CM

## 2023-02-09 DIAGNOSIS — M4316 Spondylolisthesis, lumbar region: Secondary | ICD-10-CM | POA: Insufficient documentation

## 2023-02-09 DIAGNOSIS — M5416 Radiculopathy, lumbar region: Secondary | ICD-10-CM

## 2023-02-09 DIAGNOSIS — M4726 Other spondylosis with radiculopathy, lumbar region: Secondary | ICD-10-CM | POA: Diagnosis not present

## 2023-02-09 DIAGNOSIS — M48061 Spinal stenosis, lumbar region without neurogenic claudication: Secondary | ICD-10-CM | POA: Diagnosis not present

## 2023-02-09 DIAGNOSIS — E119 Type 2 diabetes mellitus without complications: Secondary | ICD-10-CM | POA: Diagnosis not present

## 2023-02-09 DIAGNOSIS — Z7985 Long-term (current) use of injectable non-insulin antidiabetic drugs: Secondary | ICD-10-CM | POA: Diagnosis not present

## 2023-02-09 DIAGNOSIS — I10 Essential (primary) hypertension: Secondary | ICD-10-CM | POA: Diagnosis not present

## 2023-02-09 DIAGNOSIS — Z01812 Encounter for preprocedural laboratory examination: Secondary | ICD-10-CM

## 2023-02-09 DIAGNOSIS — Z01810 Encounter for preprocedural cardiovascular examination: Secondary | ICD-10-CM

## 2023-02-09 DIAGNOSIS — I150 Renovascular hypertension: Secondary | ICD-10-CM

## 2023-02-09 DIAGNOSIS — Z01818 Encounter for other preprocedural examination: Secondary | ICD-10-CM

## 2023-02-09 HISTORY — PX: FORAMINOTOMY 1 LEVEL: SHX5835

## 2023-02-09 SURGERY — FORAMINOTOMY 1 LEVEL
Anesthesia: General | Site: Spine Lumbar | Laterality: Right

## 2023-02-09 MED ORDER — CEFAZOLIN IN SODIUM CHLORIDE 2-0.9 GM/100ML-% IV SOLN: 2.0000 g | Freq: Once | INTRAVENOUS | Status: DC

## 2023-02-09 MED ORDER — OXYCODONE HCL 5 MG PO TABS
5.0000 mg | ORAL_TABLET | Freq: Once | ORAL | Status: AC | PRN
  Administered 2023-02-09: 5 mg via ORAL

## 2023-02-09 MED ORDER — OXYCODONE HCL 5 MG/5ML PO SOLN: 5.0000 mg | Freq: Once | ORAL | Status: AC | PRN

## 2023-02-09 MED ORDER — ONDANSETRON HCL 4 MG/2ML IJ SOLN
INTRAMUSCULAR | Status: AC
  Filled 2023-02-09: qty 2

## 2023-02-09 MED ORDER — CHLORHEXIDINE GLUCONATE 0.12 % MT SOLN
15.0000 mL | Freq: Once | OROMUCOSAL | Status: AC
  Administered 2023-02-09: 15 mL via OROMUCOSAL

## 2023-02-09 MED ORDER — BUPIVACAINE-EPINEPHRINE (PF) 0.5% -1:200000 IJ SOLN
INTRAMUSCULAR | Status: DC | PRN
  Administered 2023-02-09: 10 mL

## 2023-02-09 MED ORDER — ROCURONIUM BROMIDE 100 MG/10ML IV SOLN
INTRAVENOUS | Status: DC | PRN
  Administered 2023-02-09: 60 mg via INTRAVENOUS

## 2023-02-09 MED ORDER — PROPOFOL 10 MG/ML IV BOLUS
INTRAVENOUS | Status: AC
  Filled 2023-02-09: qty 20

## 2023-02-09 MED ORDER — METHOCARBAMOL 750 MG PO TABS
750.0000 mg | ORAL_TABLET | Freq: Four times a day (QID) | ORAL | 0 refills | Status: DC
Start: 2023-02-09 — End: 2023-05-05

## 2023-02-09 MED ORDER — PROPOFOL 10 MG/ML IV BOLUS
INTRAVENOUS | Status: DC | PRN
  Administered 2023-02-09: 200 mg via INTRAVENOUS

## 2023-02-09 MED ORDER — CEFAZOLIN SODIUM-DEXTROSE 2-4 GM/100ML-% IV SOLN
INTRAVENOUS | Status: AC
  Filled 2023-02-09: qty 100

## 2023-02-09 MED ORDER — MIDAZOLAM HCL 2 MG/2ML IJ SOLN
INTRAMUSCULAR | Status: DC | PRN
  Administered 2023-02-09: 2 mg via INTRAVENOUS

## 2023-02-09 MED ORDER — METHOCARBAMOL 750 MG PO TABS
ORAL_TABLET | ORAL | Status: AC
  Filled 2023-02-09: qty 1

## 2023-02-09 MED ORDER — LACTATED RINGERS IV SOLN: INTRAVENOUS | Status: DC

## 2023-02-09 MED ORDER — METHYLPREDNISOLONE ACETATE 40 MG/ML IJ SUSP
INTRAMUSCULAR | Status: AC
  Filled 2023-02-09: qty 1

## 2023-02-09 MED ORDER — EPINEPHRINE PF 1 MG/ML IJ SOLN
INTRAMUSCULAR | Status: AC
  Filled 2023-02-09: qty 1

## 2023-02-09 MED ORDER — ONDANSETRON HCL 4 MG/2ML IJ SOLN
INTRAMUSCULAR | Status: DC | PRN
Start: 2023-02-09 — End: 2023-02-09
  Administered 2023-02-09: 4 mg via INTRAVENOUS

## 2023-02-09 MED ORDER — METHOCARBAMOL 750 MG PO TABS
750.0000 mg | ORAL_TABLET | Freq: Once | ORAL | Status: AC
  Administered 2023-02-09: 750 mg via ORAL

## 2023-02-09 MED ORDER — EPHEDRINE 5 MG/ML INJ
INTRAVENOUS | Status: AC
  Filled 2023-02-09: qty 5

## 2023-02-09 MED ORDER — BUPIVACAINE LIPOSOME 1.3 % IJ SUSP
INTRAMUSCULAR | Status: AC
  Filled 2023-02-09: qty 20

## 2023-02-09 MED ORDER — ROCURONIUM BROMIDE 10 MG/ML (PF) SYRINGE
PREFILLED_SYRINGE | INTRAVENOUS | Status: AC
  Filled 2023-02-09: qty 10

## 2023-02-09 MED ORDER — LIDOCAINE HCL (CARDIAC) PF 100 MG/5ML IV SOSY
PREFILLED_SYRINGE | INTRAVENOUS | Status: DC | PRN
  Administered 2023-02-09: 50 mg via INTRAVENOUS

## 2023-02-09 MED ORDER — FENTANYL CITRATE (PF) 100 MCG/2ML IJ SOLN: 25.0000 ug | INTRAMUSCULAR | Status: DC | PRN

## 2023-02-09 MED ORDER — MIDAZOLAM HCL 2 MG/2ML IJ SOLN
INTRAMUSCULAR | Status: AC
  Filled 2023-02-09: qty 2

## 2023-02-09 MED ORDER — LIDOCAINE HCL (PF) 2 % IJ SOLN
INTRAMUSCULAR | Status: AC
  Filled 2023-02-09: qty 5

## 2023-02-09 MED ORDER — FAMOTIDINE 20 MG PO TABS
ORAL_TABLET | ORAL | Status: AC
  Filled 2023-02-09: qty 1

## 2023-02-09 MED ORDER — BUPIVACAINE HCL (PF) 0.5 % IJ SOLN
INTRAMUSCULAR | Status: AC
  Filled 2023-02-09: qty 60

## 2023-02-09 MED ORDER — ORAL CARE MOUTH RINSE: 15.0000 mL | Freq: Once | OROMUCOSAL | Status: AC

## 2023-02-09 MED ORDER — SODIUM CHLORIDE FLUSH 0.9 % IV SOLN
INTRAVENOUS | Status: AC
  Filled 2023-02-09: qty 20

## 2023-02-09 MED ORDER — FENTANYL CITRATE (PF) 100 MCG/2ML IJ SOLN
INTRAMUSCULAR | Status: DC | PRN
  Administered 2023-02-09 (×2): 50 ug via INTRAVENOUS

## 2023-02-09 MED ORDER — OXYCODONE HCL 5 MG PO TABS: 5.0000 mg | ORAL_TABLET | ORAL | 0 refills | Status: AC | PRN

## 2023-02-09 MED ORDER — SURGIFLO WITH THROMBIN (HEMOSTATIC MATRIX KIT) OPTIME
TOPICAL | Status: DC | PRN
  Administered 2023-02-09: 1 via TOPICAL

## 2023-02-09 MED ORDER — DEXMEDETOMIDINE HCL IN NACL 80 MCG/20ML IV SOLN
INTRAVENOUS | Status: DC | PRN
  Administered 2023-02-09: 12 ug via INTRAVENOUS

## 2023-02-09 MED ORDER — CHLORHEXIDINE GLUCONATE 0.12 % MT SOLN
OROMUCOSAL | Status: AC
  Filled 2023-02-09: qty 15

## 2023-02-09 MED ORDER — CEFAZOLIN SODIUM-DEXTROSE 2-3 GM-%(50ML) IV SOLR
INTRAVENOUS | Status: DC | PRN
  Administered 2023-02-09: 2 g via INTRAVENOUS

## 2023-02-09 MED ORDER — OXYCODONE HCL 5 MG PO TABS
ORAL_TABLET | ORAL | Status: AC
  Filled 2023-02-09: qty 1

## 2023-02-09 MED ORDER — 0.9 % SODIUM CHLORIDE (POUR BTL) OPTIME
TOPICAL | Status: DC | PRN
  Administered 2023-02-09: 500 mL

## 2023-02-09 MED ORDER — FAMOTIDINE 20 MG PO TABS
20.0000 mg | ORAL_TABLET | Freq: Once | ORAL | Status: AC
  Administered 2023-02-09: 20 mg via ORAL

## 2023-02-09 MED ORDER — DEXAMETHASONE SODIUM PHOSPHATE 10 MG/ML IJ SOLN
INTRAMUSCULAR | Status: AC
  Filled 2023-02-09: qty 1

## 2023-02-09 MED ORDER — SUGAMMADEX SODIUM 200 MG/2ML IV SOLN
INTRAVENOUS | Status: DC | PRN
  Administered 2023-02-09: 200 mg via INTRAVENOUS

## 2023-02-09 MED ORDER — FENTANYL CITRATE (PF) 100 MCG/2ML IJ SOLN
INTRAMUSCULAR | Status: AC
  Filled 2023-02-09: qty 2

## 2023-02-09 MED ORDER — GLYCOPYRROLATE 0.2 MG/ML IJ SOLN
INTRAMUSCULAR | Status: DC | PRN
  Administered 2023-02-09: .2 mg via INTRAVENOUS

## 2023-02-09 MED ORDER — SUCCINYLCHOLINE CHLORIDE 200 MG/10ML IV SOSY
PREFILLED_SYRINGE | INTRAVENOUS | Status: AC
  Filled 2023-02-09: qty 10

## 2023-02-09 MED ORDER — GLYCOPYRROLATE 0.2 MG/ML IJ SOLN
INTRAMUSCULAR | Status: AC
  Filled 2023-02-09: qty 1

## 2023-02-09 MED ORDER — DEXAMETHASONE SODIUM PHOSPHATE 10 MG/ML IJ SOLN
INTRAMUSCULAR | Status: DC | PRN
  Administered 2023-02-09: 10 mg via INTRAVENOUS

## 2023-02-09 MED ORDER — EPHEDRINE SULFATE (PRESSORS) 50 MG/ML IJ SOLN
INTRAMUSCULAR | Status: DC | PRN
  Administered 2023-02-09: 5 mg via INTRAVENOUS

## 2023-02-09 SURGICAL SUPPLY — 39 items
ADH SKN CLS APL DERMABOND .7 (GAUZE/BANDAGES/DRESSINGS) ×1
AGENT HMST KT MTR STRL THRMB (HEMOSTASIS) ×1
BASIN KIT SINGLE STR (MISCELLANEOUS) ×1 IMPLANT
BUR NEURO DRILL SOFT 3.0X3.8M (BURR) ×1 IMPLANT
CNTNR URN SCR LID CUP LEK RST (MISCELLANEOUS) ×1 IMPLANT
CONT SPEC 4OZ STRL OR WHT (MISCELLANEOUS) ×1
DERMABOND ADVANCED .7 DNX12 (GAUZE/BANDAGES/DRESSINGS) ×1 IMPLANT
DRAPE C ARM PK CFD 31 SPINE (DRAPES) ×1 IMPLANT
DRAPE LAPAROTOMY 100X77 ABD (DRAPES) ×1 IMPLANT
DRAPE MICROSCOPE SPINE 48X150 (DRAPES) IMPLANT
ELECT EZSTD 165MM 6.5IN (MISCELLANEOUS) ×1
ELECT REM PT RETURN 9FT ADLT (ELECTROSURGICAL) ×1
ELECTRODE EZSTD 165MM 6.5IN (MISCELLANEOUS) ×1 IMPLANT
ELECTRODE REM PT RTRN 9FT ADLT (ELECTROSURGICAL) ×1 IMPLANT
GLOVE BIOGEL PI IND STRL 6.5 (GLOVE) ×1 IMPLANT
GLOVE SURG SYN 6.5 ES PF (GLOVE) ×1 IMPLANT
GLOVE SURG SYN 6.5 PF PI (GLOVE) ×1 IMPLANT
GLOVE SURG SYN 8.5 E (GLOVE) ×3 IMPLANT
GLOVE SURG SYN 8.5 PF PI (GLOVE) ×3 IMPLANT
GOWN SRG LRG LVL 4 IMPRV REINF (GOWNS) ×1 IMPLANT
GOWN SRG XL LVL 3 NONREINFORCE (GOWNS) ×1 IMPLANT
GOWN STRL NON-REIN TWL XL LVL3 (GOWNS) ×1
GOWN STRL REIN LRG LVL4 (GOWNS) ×1
GRAFT DURAGEN MATRIX 1WX1L (Tissue) IMPLANT
KIT SPINAL PRONEVIEW (KITS) ×1 IMPLANT
MANIFOLD NEPTUNE II (INSTRUMENTS) ×1 IMPLANT
MARKER SKIN DUAL TIP RULER LAB (MISCELLANEOUS) ×1 IMPLANT
NDL SAFETY ECLIP 18X1.5 (MISCELLANEOUS) ×1 IMPLANT
NS IRRIG 1000ML POUR BTL (IV SOLUTION) ×1 IMPLANT
PACK LAMINECTOMY ARMC (PACKS) ×1 IMPLANT
SURGIFLO W/THROMBIN 8M KIT (HEMOSTASIS) ×1 IMPLANT
SUT DVC VLOC 3-0 CL 6 P-12 (SUTURE) ×1 IMPLANT
SUT VIC AB 0 CT1 27 (SUTURE) ×1
SUT VIC AB 0 CT1 27XCR 8 STRN (SUTURE) ×1 IMPLANT
SUT VIC AB 2-0 CT1 18 (SUTURE) ×1 IMPLANT
SYR 30ML LL (SYRINGE) ×2 IMPLANT
SYR 3ML LL SCALE MARK (SYRINGE) ×1 IMPLANT
TRAP FLUID SMOKE EVACUATOR (MISCELLANEOUS) ×1 IMPLANT
WATER STERILE IRR 1000ML POUR (IV SOLUTION) ×2 IMPLANT

## 2023-02-09 NOTE — Discharge Instructions (Addendum)
Your surgeon has performed an operation on your lumbar spine (low back) to relieve pressure on one or more nerves. Many times, patients feel better immediately after surgery and can "overdo it." Even if you feel well, it is important that you follow these activity guidelines. If you do not let your back heal properly from the surgery, you can increase the chance of a disc herniation and/or return of your symptoms. The following are instructions to help in your recovery once you have been discharged from the hospital.  * It is ok to take NSAIDs after surgery.  Activity    No bending, lifting, or twisting ("BLT"). Avoid lifting objects heavier than 10 pounds (gallon milk jug).  Where possible, avoid household activities that involve lifting, bending, pushing, or pulling such as laundry, vacuuming, grocery shopping, and childcare. Try to arrange for help from friends and family for these activities while your back heals.  Increase physical activity slowly as tolerated.  Taking short walks is encouraged, but avoid strenuous exercise. Do not jog, run, bicycle, lift weights, or participate in any other exercises unless specifically allowed by your doctor. Avoid prolonged sitting, including car rides.  Talk to your doctor before resuming sexual activity.  You should not drive until cleared by your doctor.  Until released by your doctor, you should not return to work or school.  You should rest at home and let your body heal.   You may shower three days after your surgery.  After showering, lightly dab your incision dry. Do not take a tub bath or go swimming for 3 weeks, or until approved by your doctor at your follow-up appointment.  If you smoke, we strongly recommend that you quit.  Smoking has been proven to interfere with normal healing in your back and will dramatically reduce the success rate of your surgery. Please contact [facility] (800-QUIT-NOW) and use the resources at www.[facility].com for  assistance in stopping smoking.  Surgical Incision   If you have a dressing on your incision, you may remove it three days after your surgery. Keep your incision area clean and dry.  If you have staples or stitches on your incision, you should have a follow up scheduled for removal. If you do not have staples or stitches, you will have steri-strips (small pieces of surgical tape) or Dermabond glue. The steri-strips/glue should begin to peel away within about a week (it is fine if the steri-strips fall off before then). If the strips are still in place one week after your surgery, you may gently remove them.  Diet            You may return to your usual diet. Be sure to stay hydrated.  When to Contact Us  Although your surgery and recovery will likely be uneventful, you may have some residual numbness, aches, and pains in your back and/or legs. This is normal and should improve in the next few weeks.  However, should you experience any of the following, contact us immediately: New numbness or weakness Pain that is progressively getting worse, and is not relieved by your pain medications or rest Bleeding, redness, swelling, pain, or drainage from surgical incision Chills or flu-like symptoms Fever greater than 101.0 F (38.3 C) Problems with bowel or bladder functions Difficulty breathing or shortness of breath Warmth, tenderness, or swelling in your calf  Contact Information How to contact us:  If you have any questions/concerns before or after surgery, you can reach us at [phone], or you can   send a mychart message. We can be reached by phone or mychart 8am-4pm, Monday-Friday.  *Please note: Calls after 4pm are forwarded to a third party answering service. Mychart messages are not routinely monitored during evenings, weekends, and holidays. Please call our office to contact the answering service for urgent concerns during non-business hours.    AMBULATORY SURGERY  DISCHARGE  INSTRUCTIONS   The drugs that you were given will stay in your system until tomorrow so for the next 24 hours you should not:  Drive an automobile Make any legal decisions Drink any alcoholic beverage   You may resume regular meals tomorrow.  Today it is better to start with liquids and gradually work up to solid foods.  You may eat anything you prefer, but it is better to start with liquids, then soup and crackers, and gradually work up to solid foods.   Please notify your doctor immediately if you have any unusual bleeding, trouble breathing, redness and pain at the surgery site, drainage, fever, or pain not relieved by medication.    Additional Instructions:        Please contact your physician with any problems or Same Day Surgery at [phone], Monday through Friday 6 am to 4 pm, or [facility] number at [phone]. 

## 2023-02-09 NOTE — Discharge Summary (Signed)
Discharge Summary  Patient ID: Diana Schultz MRN: [id number] DOB/AGE: 12/23/1965 57 y.o.  Admit date: 02/09/2023 Discharge date: 02/09/2023  Admission Diagnoses:  Lumbar radiculopathy - M54.16, Synovial cyst of lumbar - M71.38.   Discharge Diagnoses:  Active Problems:   Lumbar radiculopathy   Synovial cyst of lumbar spine   Discharged Condition: good  Hospital Course:  Diana Schultz is a 57 year old presenting with right sided lumbar radiculopathy status post L4-5 MIS minimally invasive laminoforaminotomy for synovial cyst resection.  Her intraoperative course was uncomplicated.  She was monitored in PACU and discharged home after ambulating, urinating, and tolerating p.o. intake.  She was given prescriptions for pain medication and muscle relaxer.  Consults: None  Significant Diagnostic Studies: none  Treatments: surgery: As above.  Please see separately dictated operative report for further details.  Discharge Exam: Blood pressure (!) 158/96, pulse (!) 57, temperature 98.7 F (37.1 C), temperature source Oral, resp. rate 14, height 5' 3.5" (1.613 m), weight 112.5 kg, last menstrual period 12/01/2013, SpO2 100%. CN grossly intact MAEW Incision covered with clean post-op dressing  Disposition: Discharge disposition: 01-Home or Self Care        Allergies as of 02/09/2023       Reactions   Amlodipine Cough, Swelling   Other reaction(s): Cough (ALLERGY/intolerance)   Metoprolol Swelling   Benicar [olmesartan] Cough   Hydrochlorothiazide    Other reaction(s): Cough (ALLERGY/intolerance)   Lactose Intolerance (gi)    Lisinopril Cough   Nebivolol    Other reaction(s): Other (See Comments) bradycardia   Semaglutide(0.25 Or 0.5mg -dos) Itching   Itching all over        Medication List     TAKE these medications    furosemide 20 MG tablet Commonly known as: Lasix Take 1 tablet (20 mg total) by mouth daily.   methocarbamol 750 MG tablet Commonly known as:  Robaxin-750 Take 1 tablet (750 mg total) by mouth 4 (four) times daily.   Mounjaro 15 MG/0.5ML Pen Generic drug: tirzepatide Inject 15 mg into the skin once a week.   naltrexone 50 MG tablet Commonly known as: DEPADE Take 1/2 tablet by mouth daily   oxyCODONE 5 MG immediate release tablet Commonly known as: Roxicodone Take 1 tablet (5 mg total) by mouth every 4 (four) hours as needed for up to 5 days for severe pain.   potassium chloride 10 MEQ tablet Commonly known as: Klor-Con 10 Take 1 tablet by mouth along with lasix. Take with food.   telmisartan 80 MG tablet Commonly known as: MICARDIS Take 1 tablet (80 mg total) by mouth daily.         Signed: Susanne Borders 02/09/2023, 9:10 AM

## 2023-02-09 NOTE — Op Note (Signed)
Indications: [name] is suffering from Lumbar radiculopathy - M54.16, Synovial cyst of lumbar - M71.38. The patient tried and failed conservative management, prompting surgical intervention.  Findings: synovial cyst  Preoperative Diagnosis: Lumbar radiculopathy - M54.16, Synovial cyst of lumbar - M71.38  Postoperative Diagnosis: same   EBL: 10 ml IVF:see anesthesia record Drains: none Disposition: Extubated and Stable to PACU Complications: none  No foley catheter was placed.   Preoperative Note:  Risks of surgery discussed include: infection, bleeding, stroke, coma, death, paralysis, CSF leak, nerve/spinal cord injury, numbness, tingling, weakness, complex regional pain syndrome, recurrent stenosis and/or disc herniation, vascular injury, development of instability, neck/back pain, need for further surgery, persistent symptoms, development of deformity, and the risks of anesthesia. The patient understood these risks and agreed to proceed.  Operative Note:   1. Right L4-5 laminoforaminotomy and resection of synovial cyst  The patient was then brought from the preoperative center with intravenous access established.  The patient underwent general anesthesia and endotracheal tube intubation, and was then rotated on the [place] rail top where all pressure points were appropriately padded.  The skin was then thoroughly cleansed.  Perioperative antibiotic prophylaxis was administered.  Sterile prep and drapes were then applied and a timeout was then observed.  C-arm was brought into the field under sterile conditions and under lateral visualization the L4-5 interspace was identified and marked.  The incision was marked and injected with local anesthetic. Once this was complete a 6 cm incision was opened with the use of a #10 blade knife.    The metrx tubes were sequentially advanced and confirmed in position at L4-5. An 18mm by 70mm tube was locked in place to the bed side attachment.   The microscope was then sterilely brought into the field and muscle creep was hemostased with a bipolar and resected with a pituitary rongeur.  A Bovie extender was then used to expose the spinous process and lamina.  Careful attention was placed to not violate the facet capsule. A 3 mm matchstick drill bit was then used to make a hemi-laminotomy trough until the ligamentum flavum was exposed.  This was extended to the base of the spinous process and to the contralateral side to remove all the central bone from each side.  Once this was complete and the underlying ligamentum flavum was visualized, it was dissected with a curette and resected with Kerrison rongeurs.  Extensive ligamentum hypertrophy was noted, requiring a substantial amount of time and care for removal.  The dura was identified and palpated. A synovial cyst was encountered.  Its resected in piecemeal fashion.  The kerrison rongeur was then used to remove the medial facet bilaterally until no compression was noted.  A balltip probe was used to confirm decompression of the ipsilateral L5 nerve root.  The wound was copiously irrigated. The tube system was then removed under microscopic visualization and hemostasis was obtained with a bipolar.    The fascial layer was reapproximated with the use of a 0 Vicryl suture.  Subcutaneous tissue layer was reapproximated using 2-0 Vicryl suture.  3-0 monocryl was placed in subcuticular fashion. The skin was then cleansed and Dermabond was used to close the skin opening.  Patient was then rotated back to the preoperative bed awakened from anesthesia and taken to recovery all counts are correct in this case.  I performed the entire procedure with the assistance of Manning Charity PA as an Designer, television/film set. An assistant was required for this procedure due to the  complexity.  The assistant provided assistance in tissue manipulation and suction, and was required for the successful and safe performance of the  procedure. I performed the critical portions of the procedure.   Alcee Sipos K. Myer Haff MD

## 2023-02-09 NOTE — Anesthesia Preprocedure Evaluation (Signed)
Anesthesia Evaluation  Patient identified by MRN, date of birth, ID band Patient awake    Reviewed: Allergy & Precautions, H&P , NPO status , Patient's Chart, lab work & pertinent test results, reviewed documented beta blocker date and time   Airway Mallampati: II  TM Distance: >3 FB Neck ROM: full    Dental no notable dental hx. (+) Dental Advidsory Given   Pulmonary neg pulmonary ROS   Pulmonary exam normal breath sounds clear to auscultation       Cardiovascular hypertension, negative cardio ROS Normal cardiovascular exam Rhythm:regular Rate:Normal     Neuro/Psych  PSYCHIATRIC DISORDERS  Depression    negative neurological ROS  negative psych ROS   GI/Hepatic negative GI ROS, Neg liver ROS,,,  Endo/Other  negative endocrine ROSdiabetes  Morbid obesityNo Dx or Rx   Renal/GU      Musculoskeletal   Abdominal   Peds  Hematology negative hematology ROS (+)   Anesthesia Other Findings Past Medical History: No date: Adrenal adenoma No date: Anemia No date: Arteriovenous malformation No date: Chronic RLQ pain No date: Class 3 obesity with alveolar hypoventilation and body mass  index (BMI) of 50.0 to 59.9 in adult [facility]) No date: Deep dyspareunia No date: Depression No date: Diverticulitis No date: DVT (deep venous thrombosis) (HCC)     Comment:  Hx at age 31 blood clot in right leg, d/c birth control               pills and no other problems No date: Fatty liver No date: Gallbladder disease No date: Hypertension     Comment:  history,lost weight, controlled with diet and exercise,               no current med No date: Lactose intolerance No date: Morbid obesity (HCC) No date: Osteoarthritis of left hip No date: Ovarian cyst No date: Post-menopausal No date: Prediabetes No date: Sleep apnea No date: Thalassemia No date: Umbilical hernia 1984, 1989: Vaginal delivery No date: Vitamin D deficiency  Past  Surgical History: No date: ABDOMINAL HYSTERECTOMY 03/23/13: ABLATION     Comment:  [facility] No date: APPENDECTOMY No date: CHOLECYSTECTOMY No date: DILATION AND CURETTAGE OF UTERUS 11/01/2013: DILATION AND CURETTAGE OF UTERUS; N/A     Comment:  Procedure: DILATATION AND CURETTAGE;  Surgeon: Catalina Antigua, MD;  Location: [facility];  Service: Gynecology;                Laterality: N/A; 03/23/2013: DILITATION & CURRETTAGE/HYSTROSCOPY WITH NOVASURE  ABLATION; N/A     Comment:  Procedure: DILATATION & CURETTAGE/HYSTEROSCOPY WITH               NOVASURE ABLATION;  Surgeon: Willodean Rosenthal, MD;               Location: [facility];  Service: Gynecology;  Laterality: N/A; No date: LEEP No date: TUBAL LIGATION 12/31/2013: VAGINAL HYSTERECTOMY; N/A     Comment:  Procedure: HYSTERECTOMY VAGINAL;  Surgeon: Willodean Rosenthal, MD;  Location: [facility];  Service:               Gynecology;  Laterality: N/A;  BMI    Body Mass Index: 43.24 kg/m      Reproductive/Obstetrics negative OB ROS  Anesthesia Physical Anesthesia Plan  ASA: 3  Anesthesia Plan: General ETT and General   Post-op Pain Management:    Induction: Intravenous  PONV Risk Score and Plan: Ondansetron, Dexamethasone and Midazolam  Airway Management Planned: Oral ETT  Additional Equipment:   Intra-op Plan:   Post-operative Plan: Extubation in OR  Informed Consent: I have reviewed the patients History and Physical, chart, labs and discussed the procedure including the risks, benefits and alternatives for the proposed anesthesia with the patient or authorized representative who has indicated his/her understanding and acceptance.     Dental Advisory Given  Plan Discussed with: Anesthesiologist, CRNA and Surgeon  Anesthesia Plan Comments: (Patient consented for risks of anesthesia including but not limited to:  - adverse reactions to medications -  damage to eyes, teeth, lips or other oral mucosa - nerve damage due to positioning  - sore throat or hoarseness - Damage to heart, brain, nerves, lungs, other parts of body or loss of life  Patient voiced understanding.)        Anesthesia Quick Evaluation

## 2023-02-09 NOTE — Transfer of Care (Signed)
Immediate Anesthesia Transfer of Care Note  Patient: Diana Schultz  Procedure(s) Performed: RIGHT L4-5 LAMINOFORAMINOTOMY (Right: Spine Lumbar)  Patient Location: PACU  Anesthesia Type:General  Level of Consciousness: awake  Airway & Oxygen Therapy: Patient Spontanous Breathing and Patient connected to face mask oxygen  Post-op Assessment: Report given to RN and Post -op Vital signs reviewed and stable  Post vital signs: Reviewed  Last Vitals:  Vitals Value Taken Time  BP 158/85   Temp 97.33f   Pulse 111 02/09/23 0912  Resp 22 02/09/23 0912  SpO2 99 % 02/09/23 0912  Vitals shown include unfiled device data.  Last Pain:      Patients Stated Pain Goal: 0 (02/09/23 0640)  Complications: No notable events documented.

## 2023-02-09 NOTE — Interval H&P Note (Signed)
History and Physical Interval Note:  02/09/2023 7:00 AM  Diana Schultz  has presented today for surgery, with the diagnosis of Lumbar radiculopathy  -  M54.16 Synovial cyst of lumbar - M71.38.  The various methods of treatment have been discussed with the patient and family. After consideration of risks, benefits and other options for treatment, the patient has consented to  Procedure(s): RIGHT L4-5 LAMINOFORAMINOTOMY (Right) as a surgical intervention.  The patient's history has been reviewed, patient examined, no change in status, stable for surgery.  I have reviewed the patient's chart and labs.  Questions were answered to the patient's satisfaction.    Heart sounds normal no MRG. Chest Clear to Auscultation Bilaterally.   Adryan Shin

## 2023-02-09 NOTE — Anesthesia Postprocedure Evaluation (Signed)
Anesthesia Post Note  Patient: Juliona D Kulkarni  Procedure(s) Performed: RIGHT L4-5 LAMINOFORAMINOTOMY (Right: Spine Lumbar)  Patient location during evaluation: PACU Anesthesia Type: General Level of consciousness: awake and alert Pain management: pain level controlled Vital Signs Assessment: post-procedure vital signs reviewed and stable Respiratory status: spontaneous breathing, nonlabored ventilation, respiratory function stable and patient connected to nasal cannula oxygen Cardiovascular status: blood pressure returned to baseline and stable Postop Assessment: no apparent nausea or vomiting Anesthetic complications: no   No notable events documented.   Last Vitals:  Vitals:   02/09/23 0945 02/09/23 0959  BP: (!) 144/79 (!) 158/95  Pulse: 80 70  Resp: 20 18  Temp: (!) 36.2 C (!) 36.2 C  SpO2: 98% 100%    Last Pain:  Vitals:   02/09/23 0959  TempSrc: Temporal  PainSc: 5                  Cleda Mccreedy Alawna Graybeal

## 2023-02-09 NOTE — Anesthesia Procedure Notes (Signed)
Procedure Name: Intubation Date/Time: 02/09/2023 7:40 AM  Performed by: Mathews Argyle, CRNAPre-anesthesia Checklist: Patient identified, Patient being monitored, Timeout performed, Emergency Drugs available and Suction available Patient Re-evaluated:Patient Re-evaluated prior to induction Oxygen Delivery Method: Circle system utilized Preoxygenation: Pre-oxygenation with 100% oxygen Induction Type: IV induction Ventilation: Mask ventilation without difficulty Laryngoscope Size: 3 and McGraph Grade View: Grade I Tube type: Oral Tube size: 7.0 mm Number of attempts: 1 Airway Equipment and Method: Stylet and Video-laryngoscopy Placement Confirmation: ETT inserted through vocal cords under direct vision, positive ETCO2 and breath sounds checked- equal and bilateral Secured at: 21 cm Tube secured with: Tape Dental Injury: Teeth and Oropharynx as per pre-operative assessment

## 2023-02-10 ENCOUNTER — Encounter: Payer: BC Managed Care – PPO | Admitting: Rehabilitative and Restorative Service Providers"

## 2023-02-15 ENCOUNTER — Ambulatory Visit (HOSPITAL_BASED_OUTPATIENT_CLINIC_OR_DEPARTMENT_OTHER): Payer: BC Managed Care – PPO | Admitting: Physical Therapy

## 2023-02-17 ENCOUNTER — Encounter: Payer: BC Managed Care – PPO | Admitting: Rehabilitative and Restorative Service Providers"

## 2023-02-18 NOTE — Progress Notes (Unsigned)
   REFERRING PHYSICIAN:  Laqueta Due., Md [address] [place],  [place] [address]  DOS: 02/09/23 Right L4-L5 laminoforaminotomy and resection of synovial cyst   HISTORY OF PRESENT ILLNESS: Diana Schultz is approximately 2 weeks status post Right L4-L5 laminoforaminotomy and resection of synovial cyst . Was given oxycodone and robaxin on discharge from the hospital.   Preop back and right leg pain ***   PHYSICAL EXAMINATION:  General: Patient is well developed, well nourished, calm, collected, and in no apparent distress.   NEUROLOGICAL:  General: In no acute distress.   Awake, alert, oriented to person, place, and time.  Pupils equal round and reactive to light.  Facial tone is symmetric.     Strength:            Side Iliopsoas Quads Hamstring PF DF EHL  R 5 5 5 5 5  4-***  L 5 5 5 5 5 5    Incision c/d/i   ROS (Neurologic):  Negative except as noted above  IMAGING: Nothing new to review.   ASSESSMENT/PLAN:  Diana Schultz is doing well s/p above surgery. Treatment options reviewed with patient and following plan made:   - I have advised the patient to lift up to 10 pounds until 6 weeks after surgery (follow up with Dr. Myer Haff).  - Reviewed wound care.  - No bending, twisting, or lifting.  - Continue on current medications including ***.  - Follow up as scheduled in 4 weeks and prn.   Advised to contact the office if any questions or concerns arise.  Drake Leach PA-C Department of neurosurgery

## 2023-02-22 ENCOUNTER — Ambulatory Visit (INDEPENDENT_AMBULATORY_CARE_PROVIDER_SITE_OTHER): Payer: BC Managed Care – PPO | Admitting: Orthopedic Surgery

## 2023-02-22 ENCOUNTER — Encounter: Payer: Self-pay | Admitting: Orthopedic Surgery

## 2023-02-22 VITALS — BP 130/77 | Temp 98.6°F | Ht 63.0 in | Wt 248.0 lb

## 2023-02-22 DIAGNOSIS — M5416 Radiculopathy, lumbar region: Secondary | ICD-10-CM

## 2023-02-22 DIAGNOSIS — Z09 Encounter for follow-up examination after completed treatment for conditions other than malignant neoplasm: Secondary | ICD-10-CM

## 2023-02-22 DIAGNOSIS — Z9889 Other specified postprocedural states: Secondary | ICD-10-CM

## 2023-02-22 MED ORDER — OXYCODONE HCL 5 MG PO TABS
5.0000 mg | ORAL_TABLET | Freq: Three times a day (TID) | ORAL | 0 refills | Status: DC | PRN
Start: 2023-02-22 — End: 2023-05-05

## 2023-03-22 ENCOUNTER — Ambulatory Visit (INDEPENDENT_AMBULATORY_CARE_PROVIDER_SITE_OTHER): Payer: BC Managed Care – PPO | Admitting: Neurosurgery

## 2023-03-22 ENCOUNTER — Encounter: Payer: Self-pay | Admitting: Neurosurgery

## 2023-03-22 VITALS — BP 164/101 | Ht 63.5 in | Wt 248.0 lb

## 2023-03-22 DIAGNOSIS — Z09 Encounter for follow-up examination after completed treatment for conditions other than malignant neoplasm: Secondary | ICD-10-CM

## 2023-03-22 DIAGNOSIS — M5416 Radiculopathy, lumbar region: Secondary | ICD-10-CM

## 2023-03-22 NOTE — Progress Notes (Signed)
   REFERRING PHYSICIAN:  Laqueta Due., Md [address] [place],  [place] [address]  DOS: 02/09/23 Right L4-L5 laminoforaminotomy and resection of synovial cyst   HISTORY OF PRESENT ILLNESS: Diana Schultz isstatus post Right L4-L5 laminoforaminotomy and resection of synovial cyst.  She is doing extremely well.  She has some minor discomfort in her lower back, but her leg pain is gone.  She no longer has numbness or heaviness in her right leg.  She is very happy with her improvements.     PHYSICAL EXAMINATION:  General: Patient is well developed, well nourished, calm, collected, and in no apparent distress.   NEUROLOGICAL:  General: In no acute distress.   Awake, alert, oriented to person, place, and time.  Pupils equal round and reactive to light.  Facial tone is symmetric.     Strength:            Side Iliopsoas Quads Hamstring PF DF EHL  R 5 5 5 5 5 5   L 5 5 5 5 5 5    Incision c/d/i   ROS (Neurologic):  Negative except as noted above  IMAGING: Nothing new to review.   ASSESSMENT/PLAN:  Diana Schultz is doing well s/p above surgery.   We discussed activity escalation.  She is now on a 25 pound lifting limit.  We will see her back in 6 weeks.      Venetia Night MD Department of neurosurgery

## 2023-03-24 DIAGNOSIS — E1169 Type 2 diabetes mellitus with other specified complication: Secondary | ICD-10-CM | POA: Diagnosis not present

## 2023-03-24 DIAGNOSIS — I152 Hypertension secondary to endocrine disorders: Secondary | ICD-10-CM | POA: Diagnosis not present

## 2023-03-24 DIAGNOSIS — Z9189 Other specified personal risk factors, not elsewhere classified: Secondary | ICD-10-CM | POA: Diagnosis not present

## 2023-03-24 DIAGNOSIS — M5431 Sciatica, right side: Secondary | ICD-10-CM | POA: Diagnosis not present

## 2023-04-06 DIAGNOSIS — Z6841 Body Mass Index (BMI) 40.0 and over, adult: Secondary | ICD-10-CM | POA: Diagnosis not present

## 2023-04-06 DIAGNOSIS — Z01419 Encounter for gynecological examination (general) (routine) without abnormal findings: Secondary | ICD-10-CM | POA: Diagnosis not present

## 2023-04-13 DIAGNOSIS — G4733 Obstructive sleep apnea (adult) (pediatric): Secondary | ICD-10-CM | POA: Diagnosis not present

## 2023-04-22 DIAGNOSIS — I10 Essential (primary) hypertension: Secondary | ICD-10-CM | POA: Diagnosis not present

## 2023-04-22 DIAGNOSIS — R7303 Prediabetes: Secondary | ICD-10-CM | POA: Diagnosis not present

## 2023-04-22 DIAGNOSIS — G4733 Obstructive sleep apnea (adult) (pediatric): Secondary | ICD-10-CM | POA: Diagnosis not present

## 2023-04-22 DIAGNOSIS — D509 Iron deficiency anemia, unspecified: Secondary | ICD-10-CM | POA: Diagnosis not present

## 2023-04-26 DIAGNOSIS — R718 Other abnormality of red blood cells: Secondary | ICD-10-CM | POA: Diagnosis not present

## 2023-04-26 DIAGNOSIS — Z6841 Body Mass Index (BMI) 40.0 and over, adult: Secondary | ICD-10-CM | POA: Diagnosis not present

## 2023-04-26 DIAGNOSIS — E611 Iron deficiency: Secondary | ICD-10-CM | POA: Diagnosis not present

## 2023-04-26 DIAGNOSIS — E66813 Obesity, class 3: Secondary | ICD-10-CM | POA: Diagnosis not present

## 2023-04-29 NOTE — Progress Notes (Addendum)
   REFERRING PHYSICIAN:  Laqueta Due., Md [address] [place],  [place] [address]  DOS: 02/09/23 Right L4-L5 laminoforaminotomy and resection of synovial cyst   HISTORY OF PRESENT ILLNESS:  She was doing very well at her last visit. She had minimal LBP discomfort with no leg pain!  She notes 1 week history of constant LBP with no leg pain. No known injury. No numbness, tingling, or weakness in her legs.   She is not taking any pain medications. Has been taking prn naproxen.    PHYSICAL EXAMINATION:  General: Patient is well developed, well nourished, calm, collected, and in no apparent distress.   NEUROLOGICAL:  General: In no acute distress.   Awake, alert, oriented to person, place, and time.  Pupils equal round and reactive to light.  Facial tone is symmetric.     Strength:         Side Iliopsoas Quads Hamstring PF DF EHL  R 5 5 5 5 5 5   L 5 5 5 5 5 5    Incision well healed  She has point tenderness over left and right SI joint. No tenderness over her incision.    ROS (Neurologic):  Negative except as noted above  IMAGING: Nothing new to review.   ASSESSMENT/PLAN:  Diana Schultz was doing well s/p above surgery until the last week. Now with constant LBP with no leg pain. Pain appears to be more SI joint mediated. Treatment options reviewed with patient and following plan made:   - Lumbar xrays ordered.  - Will message her with results. May consider PT and/or possible referral for SI joint injections. She would want to go to [facility].  - Continue on naproxen as directed on bottle for now. Take with food.  - Can slowly return back to her regular activities, but still be careful with lifting.  - Will discuss follow up when I message her with xray results. She is aware it will take 2 weeks for xrays to be read.   Advised to contact the office if any questions or concerns arise.  Lumbar xrays dated 05/05/23:  Lumbar spondylosis with mild DDD  L4-S1. Slip at L4-L5 and L5-S1.   Radiology report not yet available.   Recommend PT for lumbar spine and if no improvement then referral to consider SI joint injections. Message sent to patient.   ADDENDUM 05/22/23:  Lumbar xrays dated 05/05/23:  FINDINGS: No compression deformities. Degenerative changes of the thoracolumbar junction with disc space narrowing and marginal osteophytes. No osteolytic or osteoblastic changes. Grade 1 L5 retrolisthesis. Motion noted at the L4-5 level with flexion and extension consistent with instability.   IMPRESSION: Thoracolumbar degenerative changes. Grade 1 L5 retrolisthesis with instability at L4-5.     Electronically Signed   By: Layla Maw M.D.   On: 05/21/2023 20:04  I have personally reviewed the images and agree with the above interpretation.   She has some instability at L4-L5 as well. Pain appeared to be more SI joint mediated at last visit. No change to above plan.   Drake Leach PA-C Department of neurosurgery

## 2023-05-02 ENCOUNTER — Encounter: Payer: BC Managed Care – PPO | Admitting: Orthopedic Surgery

## 2023-05-03 DIAGNOSIS — M545 Low back pain, unspecified: Secondary | ICD-10-CM | POA: Diagnosis not present

## 2023-05-03 DIAGNOSIS — D509 Iron deficiency anemia, unspecified: Secondary | ICD-10-CM | POA: Diagnosis not present

## 2023-05-03 DIAGNOSIS — Z1331 Encounter for screening for depression: Secondary | ICD-10-CM | POA: Diagnosis not present

## 2023-05-03 DIAGNOSIS — E1169 Type 2 diabetes mellitus with other specified complication: Secondary | ICD-10-CM | POA: Diagnosis not present

## 2023-05-03 DIAGNOSIS — I152 Hypertension secondary to endocrine disorders: Secondary | ICD-10-CM | POA: Diagnosis not present

## 2023-05-05 ENCOUNTER — Ambulatory Visit: Payer: BC Managed Care – PPO | Admitting: Orthopedic Surgery

## 2023-05-05 ENCOUNTER — Ambulatory Visit
Admission: RE | Admit: 2023-05-05 | Discharge: 2023-05-05 | Disposition: A | Discharge status: Home / Self Care | Payer: BC Managed Care – PPO | Source: Ambulatory Visit | Attending: Orthopedic Surgery | Admitting: Orthopedic Surgery

## 2023-05-05 ENCOUNTER — Ambulatory Visit
Admission: RE | Admit: 2023-05-05 | Discharge: 2023-05-05 | Disposition: A | Discharge status: Home / Self Care | Payer: BC Managed Care – PPO | Attending: Orthopedic Surgery | Admitting: Orthopedic Surgery

## 2023-05-05 ENCOUNTER — Encounter: Payer: Self-pay | Admitting: Orthopedic Surgery

## 2023-05-05 VITALS — BP 122/78 | Temp 97.6°F | Ht 63.5 in | Wt 248.0 lb

## 2023-05-05 DIAGNOSIS — Z09 Encounter for follow-up examination after completed treatment for conditions other than malignant neoplasm: Secondary | ICD-10-CM

## 2023-05-05 DIAGNOSIS — M48061 Spinal stenosis, lumbar region without neurogenic claudication: Secondary | ICD-10-CM | POA: Diagnosis not present

## 2023-05-05 DIAGNOSIS — Z9889 Other specified postprocedural states: Secondary | ICD-10-CM | POA: Insufficient documentation

## 2023-05-05 DIAGNOSIS — M5416 Radiculopathy, lumbar region: Secondary | ICD-10-CM

## 2023-05-05 DIAGNOSIS — M5126 Other intervertebral disc displacement, lumbar region: Secondary | ICD-10-CM | POA: Diagnosis not present

## 2023-05-05 DIAGNOSIS — M47815 Spondylosis without myelopathy or radiculopathy, thoracolumbar region: Secondary | ICD-10-CM | POA: Diagnosis not present

## 2023-06-03 ENCOUNTER — Other Ambulatory Visit: Payer: Self-pay | Admitting: Family Medicine

## 2023-06-03 DIAGNOSIS — Z1231 Encounter for screening mammogram for malignant neoplasm of breast: Secondary | ICD-10-CM

## 2023-06-06 DIAGNOSIS — R632 Polyphagia: Secondary | ICD-10-CM | POA: Diagnosis not present

## 2023-06-06 DIAGNOSIS — E1169 Type 2 diabetes mellitus with other specified complication: Secondary | ICD-10-CM | POA: Diagnosis not present

## 2023-06-06 DIAGNOSIS — G8929 Other chronic pain: Secondary | ICD-10-CM | POA: Diagnosis not present

## 2023-06-06 DIAGNOSIS — M545 Low back pain, unspecified: Secondary | ICD-10-CM | POA: Diagnosis not present

## 2023-06-08 ENCOUNTER — Other Ambulatory Visit: Payer: Self-pay | Admitting: Family Medicine

## 2023-06-08 DIAGNOSIS — G8929 Other chronic pain: Secondary | ICD-10-CM

## 2023-06-11 ENCOUNTER — Ambulatory Visit
Admission: RE | Admit: 2023-06-11 | Discharge: 2023-06-11 | Disposition: A | Discharge status: Home / Self Care | Payer: BC Managed Care – PPO | Source: Ambulatory Visit | Attending: Family Medicine | Admitting: Family Medicine

## 2023-06-11 DIAGNOSIS — M5416 Radiculopathy, lumbar region: Secondary | ICD-10-CM | POA: Diagnosis not present

## 2023-06-11 DIAGNOSIS — G8929 Other chronic pain: Secondary | ICD-10-CM

## 2023-06-16 ENCOUNTER — Telehealth: Payer: Self-pay | Admitting: Orthopedic Surgery

## 2023-06-16 NOTE — Telephone Encounter (Signed)
Patient had a lumbar MRI at [facility] and her PCP has forwarded the results to the office for review. Would you like a telephone, in person, or send a mychart message to the patient.

## 2023-06-19 NOTE — Telephone Encounter (Signed)
Recommend phone visit or in person to review MRI. Can do whichever she prefers.

## 2023-06-24 ENCOUNTER — Ambulatory Visit
Admission: RE | Admit: 2023-06-24 | Discharge: 2023-06-24 | Disposition: A | Discharge status: Home / Self Care | Payer: BC Managed Care – PPO | Source: Ambulatory Visit | Attending: Family Medicine | Admitting: Family Medicine

## 2023-06-24 DIAGNOSIS — Z1231 Encounter for screening mammogram for malignant neoplasm of breast: Secondary | ICD-10-CM | POA: Diagnosis not present

## 2023-06-28 ENCOUNTER — Ambulatory Visit: Payer: BC Managed Care – PPO | Admitting: Neurosurgery

## 2023-06-28 VITALS — BP 126/82 | Ht 63.5 in | Wt 265.0 lb

## 2023-06-28 DIAGNOSIS — M4316 Spondylolisthesis, lumbar region: Secondary | ICD-10-CM

## 2023-06-28 DIAGNOSIS — M532X6 Spinal instabilities, lumbar region: Secondary | ICD-10-CM

## 2023-06-28 DIAGNOSIS — M5442 Lumbago with sciatica, left side: Secondary | ICD-10-CM | POA: Diagnosis not present

## 2023-06-28 DIAGNOSIS — M5441 Lumbago with sciatica, right side: Secondary | ICD-10-CM

## 2023-06-28 DIAGNOSIS — G8929 Other chronic pain: Secondary | ICD-10-CM

## 2023-06-28 NOTE — H&P (View-Only) (Signed)
REFERRING PHYSICIAN:  Lind Covert, Md [address] [place],  [place] [address]  DOS: 02/09/23 Right L4-L5 laminoforaminotomy and resection of synovial cyst   HISTORY OF PRESENT ILLNESS:  06/28/2023 Since her surgery, Diana Schultz has been having incapacitating back pain.  She is also having some pain in her buttocks and on the back of her legs and on the front of her legs.  Her back pain is made worse by walking, sitting, and laying down.  A pain patch has helped a small amount on the left side, but she continues to have severe pain.  She has done physical therapy but not since surgery.  She feels that she is in too much pain to consider therapy.  05/05/2023 (Stacy Luna's note) She was doing very well at her last visit. She had minimal LBP discomfort with no leg pain!  She notes 1 week history of constant LBP with no leg pain. No known injury. No numbness, tingling, or weakness in her legs.   She is not taking any pain medications. Has been taking prn naproxen.    PHYSICAL EXAMINATION:  General: Patient is well developed, well nourished, calm, collected, and in no apparent distress.   NEUROLOGICAL:  General: In no acute distress.   Awake, alert, oriented to person, place, and time.  Pupils equal round and reactive to light.  Facial tone is symmetric.     Strength:         Side Iliopsoas Quads Hamstring PF DF EHL  R 5 5 5 5 5 5   L 5 5 5 5 5 5    Incision well healed     ROS (Neurologic):  Negative except as noted above  IMAGING: MRI L spine 06/11/2023   IMPRESSION: 1. Status post treatment of synovial cyst at L4-L5 without visualized residual. 2. Unchanged mild bilateral L4-L5 neural foraminal stenosis. 3. Unchanged mild bilateral L3-L4 neural foraminal stenosis.     Electronically Signed   By: Deatra Robinson M.D.   On: 06/11/2023 20:14  L spine Flex Ext 05/05/2023 FINDINGS: No compression deformities. Degenerative changes of the thoracolumbar junction with  disc space narrowing and marginal osteophytes. No osteolytic or osteoblastic changes. Grade 1 L5 retrolisthesis. Motion noted at the L4-5 level with flexion and extension consistent with instability.   IMPRESSION: Thoracolumbar degenerative changes. Grade 1 L5 retrolisthesis with instability at L4-5.     Electronically Signed   By: Layla Maw M.D.   On: 05/21/2023 20:04  Spondylolisthesis measured by radiology as 11 mm on flexion, 6 mm on extension (on my measurement), 3 mm on MRI.    L spine xrays 09/30/2022 FINDINGS: Five lumbar-type vertebral bodies. Preserved vertebral body height, disc height. No listhesis. Mild scattered endplate osteophytes. Moderate facet degenerative changes along the mid to lower lumbar spine. Surgical clips in the right lower quadrant at the edge of the imaging field. Scattered colonic stool.   IMPRESSION: Moderate degenerative changes particularly along the lower lumbar facets     Electronically Signed   By: Karen Kays M.D.   On: 10/02/2022 10:55  Please note that there is no spondylolisthesis on her preoperative x-rays.  ASSESSMENT/PLAN:  Diana Schultz was doing well s/p above surgery until the last couple of months.  Unfortunately, she has developed instability at L4-5.  She previously had no evidence of anterolisthesis, but now has 11 mm on flexion with 6 mm on extension at L4-5.  This implies lumbar spinal instability.  She is suffering from back pain  with bilateral sciatica.  She could be getting lateral recess or foraminal compression due to the amount of anterolisthesis she gets when she stands and bends.  She is additionally now having spondylolisthesis of L4 and L5.    Diana Schultz has developed an overt lumbar spinal instability with new development of spondylolisthesis at L4-5.  This is evident on her flexion-extension radiographs.  Given the time course of this over the past 5 months, I do not feel that conservative management is  likely to be effective.  As such, I have recommended stabilization to address her instability.  I recommended L4-5 lateral lumbar interbody fusion with posterior fusion.  I discussed the planned procedure at length with the patient, including the risks, benefits, alternatives, and indications. The risks discussed include but are not limited to bleeding, infection, need for reoperation, spinal fluid leak, stroke, vision loss, anesthetic complication, coma, paralysis, and even death. I also described the possibility of psoas weakness and paresthesias. I described in detail that improvement was not guaranteed.  The patient expressed understanding of these risks, and asked that we proceed with surgery. I described the surgery in layman's terms, and gave ample opportunity for questions, which were answered to the best of my ability.   Venetia Night MD Department of neurosurgery

## 2023-06-28 NOTE — Patient Instructions (Signed)
 Please see below for information in regards to your upcoming surgery:   Planned surgery: L4-5 lateral lumbar interbody fusion and posterior spinal fusion   Surgery date: 07/22/23 at [facility] (Medical Mall: [address], [place], [place] [address]) - you will find out your arrival time the business day before your surgery.   Pre-op appointment at [facility] Pre-admit Testing: we will call you with a date/time for this. If you are scheduled for an in person appointment, Pre-admit Testing is located on the first floor of the [facility] building, [address], [address]. Please bring all prescriptions in the original prescription bottles to your appointment. During this appointment, they will advise you which medications you can take the morning of surgery, and which medications you will need to hold for surgery. Labs (such as blood work, EKG) may be done at your pre-op appointment. You are not required to fast for these labs. Should you need to change your pre-op appointment, please call Pre-admit testing at [phone].      Diabetes/weight loss medications:  Tirzepatide  (Mounjaro ): hold 7 days prior to surgery    Surgical clearance: we will send a clearance form to Dr Prentiss. They may wish to see you in their office prior to signing the clearance form. If so, they may call you to schedule an appointment.     NSAIDS (Non-steroidal anti-inflammatory drugs): because you are having a fusion, please avoid taking any NSAIDS (examples: ibuprofen , motrin , aleve, naproxen, meloxicam , diclofenac) for 3 months after surgery. Celebrex is an exception and is OK to take, if prescribed. Tylenol  is not an NSAID.    Common restrictions after surgery: No bending, lifting, or twisting ("BLT"). Avoid lifting objects heavier than 10 pounds for the first 6 weeks after surgery. Where possible, avoid household activities that involve lifting, bending, reaching,  pushing, or pulling such as laundry, vacuuming, grocery shopping, and childcare. Try to arrange for help from friends and family for these activities while you heal. Do not drive while taking prescription pain medication. Weeks 6 through 12 after surgery: avoid lifting more than 25 pounds.    X-rays after surgery: Because you are having a fusion or arthroplasty: for appointments after your 2 week follow-up: please arrive at the [facility] outpatient imaging center ([address], [place]) or [facility] one hour prior to your appointment for x-rays. This applies to every appointment after your 2 week follow-up. Failure to do so may result in your appointment being rescheduled.   How to contact us :  If you have any questions/concerns before or after surgery, you can reach us  at [phone], or you can send a mychart message. We can be reached by phone or mychart 8am-4pm, Monday-Friday.  *Please note: Calls after 4pm are forwarded to a third party answering service. Mychart messages are not routinely monitored during evenings, weekends, and holidays. Please call our office to contact the answering service for urgent concerns during non-business hours.    If you have FMLA/disability paperwork, please drop it off or fax it to [phone], attention Patty.   Appointments/FMLA & disability paperwork: Odetta Mora, & Ritta Registered Nurse/Surgery scheduler: [facility] Medical Assistants: Damien ODESSIA Sailors Physician Assistants: Lyle Decamp, PA-C, Edsel Goods, PA-C & Glade Boys, PA-C Surgeons: Reeves Daisy, MD & Penne Sharps, MD

## 2023-06-28 NOTE — Progress Notes (Signed)
 REFERRING PHYSICIAN:  Lind Covert, Md [address] [place],  [place] [address]  DOS: 02/09/23 Right L4-L5 laminoforaminotomy and resection of synovial cyst   HISTORY OF PRESENT ILLNESS:  06/28/2023 Since her surgery, Diana Schultz has been having incapacitating back pain.  She is also having some pain in her buttocks and on the back of her legs and on the front of her legs.  Her back pain is made worse by walking, sitting, and laying down.  A pain patch has helped a small amount on the left side, but she continues to have severe pain.  She has done physical therapy but not since surgery.  She feels that she is in too much pain to consider therapy.  05/05/2023 (Diana Schultz's note) She was doing very well at her last visit. She had minimal LBP discomfort with no leg pain!  She notes 1 week history of constant LBP with no leg pain. No known injury. No numbness, tingling, or weakness in her legs.   She is not taking any pain medications. Has been taking prn naproxen.    PHYSICAL EXAMINATION:  General: Patient is well developed, well nourished, calm, collected, and in no apparent distress.   NEUROLOGICAL:  General: In no acute distress.   Awake, alert, oriented to person, place, and time.  Pupils equal round and reactive to light.  Facial tone is symmetric.     Strength:         Side Iliopsoas Quads Hamstring PF DF EHL  R 5 5 5 5 5 5   L 5 5 5 5 5 5    Incision well healed     ROS (Neurologic):  Negative except as noted above  IMAGING: MRI L spine 06/11/2023   IMPRESSION: 1. Status post treatment of synovial cyst at L4-L5 without visualized residual. 2. Unchanged mild bilateral L4-L5 neural foraminal stenosis. 3. Unchanged mild bilateral L3-L4 neural foraminal stenosis.     Electronically Signed   By: Deatra Robinson M.D.   On: 06/11/2023 20:14  L spine Flex Ext 05/05/2023 FINDINGS: No compression deformities. Degenerative changes of the thoracolumbar junction with  disc space narrowing and marginal osteophytes. No osteolytic or osteoblastic changes. Grade 1 L5 retrolisthesis. Motion noted at the L4-5 level with flexion and extension consistent with instability.   IMPRESSION: Thoracolumbar degenerative changes. Grade 1 L5 retrolisthesis with instability at L4-5.     Electronically Signed   By: Layla Maw M.D.   On: 05/21/2023 20:04  Spondylolisthesis measured by radiology as 11 mm on flexion, 6 mm on extension (on my measurement), 3 mm on MRI.    L spine xrays 09/30/2022 FINDINGS: Five lumbar-type vertebral bodies. Preserved vertebral body height, disc height. No listhesis. Mild scattered endplate osteophytes. Moderate facet degenerative changes along the mid to lower lumbar spine. Surgical clips in the right lower quadrant at the edge of the imaging field. Scattered colonic stool.   IMPRESSION: Moderate degenerative changes particularly along the lower lumbar facets     Electronically Signed   By: Karen Kays M.D.   On: 10/02/2022 10:55  Please note that there is no spondylolisthesis on her preoperative x-rays.  ASSESSMENT/PLAN:  Diana Schultz was doing well s/p above surgery until the last couple of months.  Unfortunately, she has developed instability at L4-5.  She previously had no evidence of anterolisthesis, but now has 11 mm on flexion with 6 mm on extension at L4-5.  This implies lumbar spinal instability.  She is suffering from back pain  with bilateral sciatica.  She could be getting lateral recess or foraminal compression due to the amount of anterolisthesis she gets when she stands and bends.  She is additionally now having spondylolisthesis of L4 and L5.    Diana Schultz has developed an overt lumbar spinal instability with new development of spondylolisthesis at L4-5.  This is evident on her flexion-extension radiographs.  Given the time course of this over the past 5 months, I do not feel that conservative management is  likely to be effective.  As such, I have recommended stabilization to address her instability.  I recommended L4-5 lateral lumbar interbody fusion with posterior fusion.  I discussed the planned procedure at length with the patient, including the risks, benefits, alternatives, and indications. The risks discussed include but are not limited to bleeding, infection, need for reoperation, spinal fluid leak, stroke, vision loss, anesthetic complication, coma, paralysis, and even death. I also described the possibility of psoas weakness and paresthesias. I described in detail that improvement was not guaranteed.  The patient expressed understanding of these risks, and asked that we proceed with surgery. I described the surgery in layman's terms, and gave ample opportunity for questions, which were answered to the best of my ability.   Venetia Night MD Department of neurosurgery

## 2023-06-29 ENCOUNTER — Other Ambulatory Visit: Payer: Self-pay

## 2023-06-29 DIAGNOSIS — Z01818 Encounter for other preprocedural examination: Secondary | ICD-10-CM

## 2023-07-05 ENCOUNTER — Telehealth: Payer: Self-pay | Admitting: Neurology

## 2023-07-05 NOTE — Telephone Encounter (Signed)
 Pt called wanting know if there is another DME that she can use due to all the trouble she is having with [facility]. Please advise.

## 2023-07-08 DIAGNOSIS — R632 Polyphagia: Secondary | ICD-10-CM | POA: Diagnosis not present

## 2023-07-08 DIAGNOSIS — M5441 Lumbago with sciatica, right side: Secondary | ICD-10-CM | POA: Diagnosis not present

## 2023-07-08 DIAGNOSIS — E1169 Type 2 diabetes mellitus with other specified complication: Secondary | ICD-10-CM | POA: Diagnosis not present

## 2023-07-14 ENCOUNTER — Inpatient Hospital Stay: Admission: RE | Admit: 2023-07-14 | Payer: BC Managed Care – PPO | Source: Ambulatory Visit

## 2023-07-14 ENCOUNTER — Encounter
Admission: RE | Admit: 2023-07-14 | Discharge: 2023-07-14 | Disposition: A | Discharge status: Home / Self Care | Payer: BC Managed Care – PPO | Source: Ambulatory Visit | Attending: Neurosurgery | Admitting: Neurosurgery

## 2023-07-14 VITALS — BP 153/82 | HR 52 | Resp 12 | Ht 63.5 in | Wt 270.1 lb

## 2023-07-14 DIAGNOSIS — Z01812 Encounter for preprocedural laboratory examination: Secondary | ICD-10-CM

## 2023-07-14 DIAGNOSIS — E119 Type 2 diabetes mellitus without complications: Secondary | ICD-10-CM | POA: Diagnosis not present

## 2023-07-14 DIAGNOSIS — Z01818 Encounter for other preprocedural examination: Secondary | ICD-10-CM | POA: Insufficient documentation

## 2023-07-14 DIAGNOSIS — I150 Renovascular hypertension: Secondary | ICD-10-CM | POA: Diagnosis not present

## 2023-07-14 DIAGNOSIS — Z01810 Encounter for preprocedural cardiovascular examination: Secondary | ICD-10-CM

## 2023-07-14 DIAGNOSIS — G4733 Obstructive sleep apnea (adult) (pediatric): Secondary | ICD-10-CM | POA: Diagnosis not present

## 2023-07-14 HISTORY — DX: Spondylolisthesis, lumbar region: M43.16

## 2023-07-14 HISTORY — DX: Radiculopathy, lumbar region: M54.16

## 2023-07-14 HISTORY — DX: Arteriovenous fistula of pulmonary vessels: I28.0

## 2023-07-14 LAB — CBC
HCT: 42.6 % (ref 36.0–46.0)
Hemoglobin: 13.8 g/dL (ref 12.0–15.0)
MCH: 26 pg (ref 26.0–34.0)
MCHC: 32.4 g/dL (ref 30.0–36.0)
MCV: 80.2 fL (ref 80.0–100.0)
Platelets: 215 10*3/uL (ref 150–400)
RBC: 5.31 MIL/uL — ABNORMAL HIGH (ref 3.87–5.11)
RDW: 14.6 % (ref 11.5–15.5)
WBC: 7.1 10*3/uL (ref 4.0–10.5)
nRBC: 0 % (ref 0.0–0.2)

## 2023-07-14 LAB — BASIC METABOLIC PANEL
Anion gap: 7 (ref 5–15)
BUN: 22 mg/dL — ABNORMAL HIGH (ref 6–20)
CO2: 26 mmol/L (ref 22–32)
Calcium: 9.3 mg/dL (ref 8.9–10.3)
Chloride: 105 mmol/L (ref 98–111)
Creatinine, Ser: 0.73 mg/dL (ref 0.44–1.00)
GFR, Estimated: >60 mL/min (ref >60)
Glucose, Bld: 93 mg/dL (ref 70–99)
Potassium: 3.9 mmol/L (ref 3.5–5.1)
Sodium: 138 mmol/L (ref 135–145)

## 2023-07-14 LAB — URINALYSIS, COMPLETE (UACMP) WITH MICROSCOPIC
Bilirubin Urine: NEGATIVE
Glucose, UA: NEGATIVE mg/dL
Hgb urine dipstick: NEGATIVE
Ketones, ur: NEGATIVE mg/dL
Leukocytes,Ua: NEGATIVE
Nitrite: NEGATIVE
Protein, ur: NEGATIVE mg/dL
Specific Gravity, Urine: 1.019 (ref 1.005–1.030)
pH: 7 (ref 5.0–8.0)

## 2023-07-14 LAB — SURGICAL PCR SCREEN
MRSA, PCR: NEGATIVE
Staphylococcus aureus: NEGATIVE

## 2023-07-14 LAB — TYPE AND SCREEN
ABO/RH(D): AB POS
Antibody Screen: NEGATIVE

## 2023-07-14 LAB — HEMOGLOBIN A1C
Hgb A1c MFr Bld: 5.3 % (ref 4.8–5.6)
Mean Plasma Glucose: 105.41 mg/dL

## 2023-07-14 NOTE — Patient Instructions (Addendum)
Your procedure is scheduled on:07-22-23 Friday Report to the Registration Desk on the 1st floor of the [facility].Then proceed to the 2nd floor Surgery Desk To find out your arrival time, please call [phone] between 1PM - 3PM on:07-21-23 Thursday If your arrival time is 6:00 am, do not arrive before that time as the [facility] entrance doors do not open until 6:00 am.  REMEMBER: Instructions that are not followed completely may result in serious medical risk, up to and including death; or upon the discretion of your surgeon and anesthesiologist your surgery may need to be rescheduled.  Do not eat food after midnight the night before surgery.  No gum chewing or hard candies.  You may however, drink Water up to 2 hours before you are scheduled to arrive for your surgery. Do not drink anything within 2 hours of your scheduled arrival time.  One week prior to surgery:Stop NOW (07-14-23) Stop ANY OVER THE COUNTER supplements until after surgery (Magnesium Glycinate)  Stop tirzepatide [facility]) 7 days prior to surgery-Do NOT take again until AFTER surgery  Continue taking all of your other prescription medications up until the day of surgery.  Do NOT take any medication the day of surgery  No Alcohol for 24 hours before or after surgery.  No Smoking including e-cigarettes for 24 hours before surgery.  No chewable tobacco products for at least 6 hours before surgery.  No nicotine patches on the day of surgery.  Do not use any "recreational" drugs for at least a week (preferably 2 weeks) before your surgery.  Please be advised that the combination of cocaine and anesthesia may have negative outcomes, up to and including death. If you test positive for cocaine, your surgery will be cancelled.  On the morning of surgery brush your teeth with toothpaste and water, you may rinse your mouth with mouthwash if you wish. Do not swallow any toothpaste or mouthwash.  Use CHG Soap as  directed on instruction sheet.  Do not wear jewelry, make-up, hairpins, clips or nail polish.  For welded (permanent) jewelry: bracelets, anklets, waist bands, etc.  Please have this removed prior to surgery.  If it is not removed, there is a chance that hospital personnel will need to cut it off on the day of surgery.  Do not wear lotions, powders, or perfumes.   Do not shave body hair from the neck down 48 hours before surgery.  Contact lenses, hearing aids and dentures may not be worn into surgery.  Do not bring valuables to the hospital. [facility] is not responsible for any missing/lost belongings or valuables.   Bring your C-PAP to the hospital   Notify your doctor if there is any change in your medical condition (cold, fever, infection).  Wear comfortable clothing (specific to your surgery type) to the hospital.  After surgery, you can help prevent lung complications by doing breathing exercises.  Take deep breaths and cough every 1-2 hours. Your doctor may order a device called an Incentive Spirometer to help you take deep breaths. When coughing or sneezing, hold a pillow firmly against your incision with both hands. This is called "splinting." Doing this helps protect your incision. It also decreases belly discomfort.  If you are being admitted to the hospital overnight, leave your suitcase in the car. After surgery it may be brought to your room.  In case of increased patient census, it may be necessary for you, the patient, to continue your postoperative care in the Same  Day Surgery department.  If you are being discharged the day of surgery, you will not be allowed to drive home. You will need a responsible individual to drive you home and stay with you for 24 hours after surgery.   If you are taking public transportation, you will need to have a responsible individual with you.  Please call the Pre-admissions Testing Dept. at [phone] if you have any questions  about these instructions.  Surgery Visitation Policy:  Patients having surgery or a procedure may have two visitors.  Children under the age of 41 must have an adult with them who is not the patient.  Temporary Visitor Restrictions Due to increasing cases of flu, RSV and COVID-19: Children ages 80 and under will not be able to visit patients in [facility] hospitals under most circumstances.  Inpatient Visitation:    Visiting hours are 7 a.m. to 8 p.m. Up to four visitors are allowed at one time in a patient room. The visitors may rotate out with other people during the day.  One visitor age 12 or older may stay with the patient overnight and must be in the room by 8 p.m.    Pre-operative 5 CHG Bath Instructions   You can play a key role in reducing the risk of infection after surgery. Your skin needs to be as free of germs as possible. You can reduce the number of germs on your skin by washing with CHG (chlorhexidine gluconate) soap before surgery. CHG is an antiseptic soap that kills germs and continues to kill germs even after washing.   DO NOT use if you have an allergy to chlorhexidine/CHG or antibacterial soaps. If your skin becomes reddened or irritated, stop using the CHG and notify one of our RNs at [phone].   Please shower with the CHG soap starting 4 days before surgery using the following schedule:     Please keep in mind the following:  DO NOT shave, including legs and underarms, starting the day of your first shower.   You may shave your face at any point before/day of surgery.  Place clean sheets on your bed the day you start using CHG soap. Use a clean washcloth (not used since being washed) for each shower. DO NOT sleep with pets once you start using the CHG.   CHG Shower Instructions:  If you choose to wash your hair and private area, wash first with your normal shampoo/soap.  After you use shampoo/soap, rinse your hair and body thoroughly to remove  shampoo/soap residue.  Turn the water OFF and apply about 3 tablespoons (45 ml) of CHG soap to a CLEAN washcloth.  Apply CHG soap ONLY FROM YOUR NECK DOWN TO YOUR TOES (washing for 3-5 minutes)  DO NOT use CHG soap on face, private areas, open wounds, or sores.  Pay special attention to the area where your surgery is being performed.  If you are having back surgery, having someone wash your back for you may be helpful. Wait 2 minutes after CHG soap is applied, then you may rinse off the CHG soap.  Pat dry with a clean towel  Put on clean clothes/pajamas   If you choose to wear lotion, please use ONLY the CHG-compatible lotions on the back of this paper.     Additional instructions for the day of surgery: DO NOT APPLY any lotions, deodorants, cologne, or perfumes.   Put on clean/comfortable clothes.  Brush your teeth.  Ask your nurse before applying any  prescription medications to the skin.      CHG Compatible Lotions   Aveeno Moisturizing lotion  Cetaphil Moisturizing Cream  Cetaphil Moisturizing Lotion  Clairol Herbal Essence Moisturizing Lotion, Dry Skin  Clairol Herbal Essence Moisturizing Lotion, Extra Dry Skin  Clairol Herbal Essence Moisturizing Lotion, Normal Skin  Curel Age Defying Therapeutic Moisturizing Lotion with Alpha Hydroxy  Curel Extreme Care Body Lotion  Curel Soothing Hands Moisturizing Hand Lotion  Curel Therapeutic Moisturizing Cream, Fragrance-Free  Curel Therapeutic Moisturizing Lotion, Fragrance-Free  Curel Therapeutic Moisturizing Lotion, Original Formula  Eucerin Daily Replenishing Lotion  Eucerin Dry Skin Therapy Plus Alpha Hydroxy Crme  Eucerin Dry Skin Therapy Plus Alpha Hydroxy Lotion  Eucerin Original Crme  Eucerin Original Lotion  Eucerin Plus Crme Eucerin Plus Lotion  Eucerin TriLipid Replenishing Lotion  Keri Anti-Bacterial Hand Lotion  Keri Deep Conditioning Original Lotion Dry Skin Formula Softly Scented  Keri Deep Conditioning  Original Lotion, Fragrance Free Sensitive Skin Formula  Keri Lotion Fast Absorbing Fragrance Free Sensitive Skin Formula  Keri Lotion Fast Absorbing Softly Scented Dry Skin Formula  Keri Original Lotion  Keri Skin Renewal Lotion Keri Silky Smooth Lotion  Keri Silky Smooth Sensitive Skin Lotion  Nivea Body Creamy Conditioning Oil  Nivea Body Extra Enriched Teacher, adult education Moisturizing Lotion Nivea Crme  Nivea Skin Firming Lotion  NutraDerm 30 Skin Lotion  NutraDerm Skin Lotion  NutraDerm Therapeutic Skin Cream  NutraDerm Therapeutic Skin Lotion  ProShield Protective Hand Cream  Provon moisturizing lotion

## 2023-07-21 MED ORDER — VANCOMYCIN HCL IN DEXTROSE 1-5 GM/200ML-% IV SOLN
1000.0000 mg | Freq: Once | INTRAVENOUS | Status: AC
  Administered 2023-07-22: 1000 mg via INTRAVENOUS

## 2023-07-21 MED ORDER — SODIUM CHLORIDE 0.9 % IV SOLN: INTRAVENOUS | Status: DC

## 2023-07-21 MED ORDER — CEFAZOLIN IN SODIUM CHLORIDE 2-0.9 GM/100ML-% IV SOLN
3.0000 g | Freq: Once | INTRAVENOUS | Status: DC
  Filled 2023-07-21: qty 200

## 2023-07-21 MED ORDER — ORAL CARE MOUTH RINSE: 15.0000 mL | Freq: Once | OROMUCOSAL | Status: AC

## 2023-07-21 MED ORDER — CHLORHEXIDINE GLUCONATE 0.12 % MT SOLN
15.0000 mL | Freq: Once | OROMUCOSAL | Status: AC
  Administered 2023-07-22: 15 mL via OROMUCOSAL

## 2023-07-22 ENCOUNTER — Other Ambulatory Visit: Payer: Self-pay

## 2023-07-22 ENCOUNTER — Inpatient Hospital Stay: Payer: BC Managed Care – PPO | Admitting: Certified Registered Nurse Anesthetist

## 2023-07-22 ENCOUNTER — Inpatient Hospital Stay: Payer: BC Managed Care – PPO

## 2023-07-22 ENCOUNTER — Inpatient Hospital Stay
Admission: RE | Admit: 2023-07-22 | Discharge: 2023-07-23 | DRG: 402 | Disposition: A | Discharge status: Home / Self Care | Payer: BC Managed Care – PPO | Attending: Neurosurgery | Admitting: Neurosurgery

## 2023-07-22 ENCOUNTER — Inpatient Hospital Stay: Payer: BC Managed Care – PPO | Admitting: Urgent Care

## 2023-07-22 ENCOUNTER — Encounter
Admission: RE | Disposition: A | Discharge status: Home / Self Care | Payer: Self-pay | Source: Home / Self Care | Attending: Neurosurgery

## 2023-07-22 ENCOUNTER — Encounter: Payer: Self-pay | Admitting: Neurosurgery

## 2023-07-22 DIAGNOSIS — Z8249 Family history of ischemic heart disease and other diseases of the circulatory system: Secondary | ICD-10-CM

## 2023-07-22 DIAGNOSIS — M7138 Other bursal cyst, other site: Secondary | ICD-10-CM | POA: Diagnosis not present

## 2023-07-22 DIAGNOSIS — Z8049 Family history of malignant neoplasm of other genital organs: Secondary | ICD-10-CM | POA: Diagnosis not present

## 2023-07-22 DIAGNOSIS — Z7985 Long-term (current) use of injectable non-insulin antidiabetic drugs: Secondary | ICD-10-CM | POA: Diagnosis not present

## 2023-07-22 DIAGNOSIS — Z888 Allergy status to other drugs, medicaments and biological substances status: Secondary | ICD-10-CM

## 2023-07-22 DIAGNOSIS — I10 Essential (primary) hypertension: Secondary | ICD-10-CM | POA: Diagnosis not present

## 2023-07-22 DIAGNOSIS — Z981 Arthrodesis status: Principal | ICD-10-CM

## 2023-07-22 DIAGNOSIS — Z823 Family history of stroke: Secondary | ICD-10-CM | POA: Diagnosis not present

## 2023-07-22 DIAGNOSIS — Z91011 Allergy to milk products: Secondary | ICD-10-CM

## 2023-07-22 DIAGNOSIS — Z83438 Family history of other disorder of lipoprotein metabolism and other lipidemia: Secondary | ICD-10-CM

## 2023-07-22 DIAGNOSIS — G8929 Other chronic pain: Secondary | ICD-10-CM

## 2023-07-22 DIAGNOSIS — Z810 Family history of intellectual disabilities: Secondary | ICD-10-CM | POA: Diagnosis not present

## 2023-07-22 DIAGNOSIS — Z886 Allergy status to analgesic agent status: Secondary | ICD-10-CM

## 2023-07-22 DIAGNOSIS — M532X6 Spinal instabilities, lumbar region: Principal | ICD-10-CM

## 2023-07-22 DIAGNOSIS — M5442 Lumbago with sciatica, left side: Secondary | ICD-10-CM

## 2023-07-22 DIAGNOSIS — Z808 Family history of malignant neoplasm of other organs or systems: Secondary | ICD-10-CM

## 2023-07-22 DIAGNOSIS — M4316 Spondylolisthesis, lumbar region: Secondary | ICD-10-CM | POA: Diagnosis not present

## 2023-07-22 DIAGNOSIS — Z803 Family history of malignant neoplasm of breast: Secondary | ICD-10-CM | POA: Diagnosis not present

## 2023-07-22 DIAGNOSIS — Z818 Family history of other mental and behavioral disorders: Secondary | ICD-10-CM | POA: Diagnosis not present

## 2023-07-22 DIAGNOSIS — Z833 Family history of diabetes mellitus: Secondary | ICD-10-CM | POA: Diagnosis not present

## 2023-07-22 DIAGNOSIS — M5441 Lumbago with sciatica, right side: Secondary | ICD-10-CM | POA: Diagnosis present

## 2023-07-22 DIAGNOSIS — Z01818 Encounter for other preprocedural examination: Secondary | ICD-10-CM

## 2023-07-22 HISTORY — PX: ANTERIOR LATERAL LUMBAR FUSION WITH PERCUTANEOUS SCREW 1 LEVEL: SHX5553

## 2023-07-22 HISTORY — PX: APPLICATION OF INTRAOPERATIVE CT SCAN: SHX6668

## 2023-07-22 SURGERY — ANTERIOR LATERAL LUMBAR FUSION WITH PERCUTANEOUS SCREW 1 LEVEL
Anesthesia: General | Site: Back

## 2023-07-22 MED ORDER — DEXAMETHASONE SODIUM PHOSPHATE 10 MG/ML IJ SOLN
INTRAMUSCULAR | Status: DC | PRN
  Administered 2023-07-22: 10 mg via INTRAVENOUS

## 2023-07-22 MED ORDER — CEFAZOLIN IN SODIUM CHLORIDE 3-0.9 GM/100ML-% IV SOLN
3.0000 g | INTRAVENOUS | Status: DC
  Filled 2023-07-22: qty 100

## 2023-07-22 MED ORDER — METHOCARBAMOL 1000 MG/10ML IJ SOLN
500.0000 mg | Freq: Four times a day (QID) | INTRAMUSCULAR | Status: DC
  Administered 2023-07-22: 500 mg via INTRAVENOUS
  Filled 2023-07-22: qty 10

## 2023-07-22 MED ORDER — PHENYLEPHRINE HCL-NACL 20-0.9 MG/250ML-% IV SOLN: INTRAVENOUS | Status: DC | PRN

## 2023-07-22 MED ORDER — SURGIFLO WITH THROMBIN (HEMOSTATIC MATRIX KIT) OPTIME
TOPICAL | Status: DC | PRN
  Administered 2023-07-22: 1 via TOPICAL

## 2023-07-22 MED ORDER — PROPOFOL 1000 MG/100ML IV EMUL
INTRAVENOUS | Status: AC
  Filled 2023-07-22: qty 100

## 2023-07-22 MED ORDER — REMIFENTANIL HCL 1 MG IV SOLR
INTRAVENOUS | Status: AC
  Filled 2023-07-22: qty 1000

## 2023-07-22 MED ORDER — CHLORHEXIDINE GLUCONATE 0.12 % MT SOLN
OROMUCOSAL | Status: AC
  Filled 2023-07-22: qty 15

## 2023-07-22 MED ORDER — OXYCODONE HCL 5 MG PO TABS
10.0000 mg | ORAL_TABLET | ORAL | Status: DC | PRN
  Administered 2023-07-22 (×2): 10 mg via ORAL

## 2023-07-22 MED ORDER — FENTANYL CITRATE (PF) 100 MCG/2ML IJ SOLN
25.0000 ug | INTRAMUSCULAR | Status: DC | PRN
  Administered 2023-07-22 (×3): 50 ug via INTRAVENOUS
  Administered 2023-07-22 (×2): 25 ug via INTRAVENOUS

## 2023-07-22 MED ORDER — HYDROMORPHONE HCL 1 MG/ML IJ SOLN
INTRAMUSCULAR | Status: DC | PRN
  Administered 2023-07-22: 1 mg via INTRAVENOUS

## 2023-07-22 MED ORDER — OXYCODONE HCL 5 MG PO TABS
ORAL_TABLET | ORAL | Status: AC
  Filled 2023-07-22: qty 2

## 2023-07-22 MED ORDER — IRRISEPT - 450ML BOTTLE WITH 0.05% CHG IN STERILE WATER, USP 99.95% OPTIME
TOPICAL | Status: DC | PRN
  Administered 2023-07-22: 450 mL via TOPICAL

## 2023-07-22 MED ORDER — BUPIVACAINE LIPOSOME 1.3 % IJ SUSP
INTRAMUSCULAR | Status: AC
  Filled 2023-07-22: qty 20

## 2023-07-22 MED ORDER — ACETAMINOPHEN 10 MG/ML IV SOLN
INTRAVENOUS | Status: DC | PRN
  Administered 2023-07-22: 1000 mg via INTRAVENOUS

## 2023-07-22 MED ORDER — METHOCARBAMOL 500 MG PO TABS
ORAL_TABLET | ORAL | Status: AC
  Filled 2023-07-22: qty 1

## 2023-07-22 MED ORDER — ONDANSETRON HCL 4 MG/2ML IJ SOLN
INTRAMUSCULAR | Status: AC
  Filled 2023-07-22: qty 2

## 2023-07-22 MED ORDER — PROPOFOL 500 MG/50ML IV EMUL
INTRAVENOUS | Status: DC | PRN
  Administered 2023-07-22: 100 ug/kg/min via INTRAVENOUS

## 2023-07-22 MED ORDER — REMIFENTANIL HCL 2 MG IV SOLR
INTRAVENOUS | Status: DC | PRN
  Administered 2023-07-22 (×2): 59.4 ug via INTRAVENOUS

## 2023-07-22 MED ORDER — MIDAZOLAM HCL 2 MG/2ML IJ SOLN
INTRAMUSCULAR | Status: AC
  Filled 2023-07-22: qty 2

## 2023-07-22 MED ORDER — SODIUM CHLORIDE 0.9% FLUSH
3.0000 mL | Freq: Two times a day (BID) | INTRAVENOUS | Status: DC
  Administered 2023-07-22 – 2023-07-23 (×2): 3 mL via INTRAVENOUS

## 2023-07-22 MED ORDER — PHENYLEPHRINE HCL-NACL 20-0.9 MG/250ML-% IV SOLN
INTRAVENOUS | Status: AC
  Filled 2023-07-22: qty 250

## 2023-07-22 MED ORDER — MAGNESIUM CITRATE PO SOLN: 1.0000 | Freq: Once | ORAL | Status: DC | PRN

## 2023-07-22 MED ORDER — MORPHINE SULFATE (PF) 2 MG/ML IV SOLN
1.0000 mg | INTRAVENOUS | Status: AC | PRN
  Administered 2023-07-22: 1 mg via INTRAVENOUS

## 2023-07-22 MED ORDER — MIDAZOLAM HCL 2 MG/2ML IJ SOLN
INTRAMUSCULAR | Status: DC | PRN
  Administered 2023-07-22: 2 mg via INTRAVENOUS

## 2023-07-22 MED ORDER — SODIUM CHLORIDE (PF) 0.9 % IJ SOLN
INTRAMUSCULAR | Status: DC | PRN
  Administered 2023-07-22: 60 mL

## 2023-07-22 MED ORDER — PROPOFOL 10 MG/ML IV BOLUS
INTRAVENOUS | Status: DC | PRN
  Administered 2023-07-22: 40 mg via INTRAVENOUS
  Administered 2023-07-22: 20 mg via INTRAVENOUS
  Administered 2023-07-22: 120 mg via INTRAVENOUS

## 2023-07-22 MED ORDER — FENTANYL CITRATE (PF) 100 MCG/2ML IJ SOLN
INTRAMUSCULAR | Status: AC
  Filled 2023-07-22: qty 2

## 2023-07-22 MED ORDER — DOCUSATE SODIUM 100 MG PO CAPS
100.0000 mg | ORAL_CAPSULE | Freq: Two times a day (BID) | ORAL | Status: DC
  Administered 2023-07-22 – 2023-07-23 (×3): 100 mg via ORAL
  Filled 2023-07-22 (×2): qty 1

## 2023-07-22 MED ORDER — LIDOCAINE HCL (CARDIAC) PF 100 MG/5ML IV SOSY
PREFILLED_SYRINGE | INTRAVENOUS | Status: DC | PRN
  Administered 2023-07-22: 100 mg via INTRAVENOUS

## 2023-07-22 MED ORDER — VANCOMYCIN HCL IN DEXTROSE 1-5 GM/200ML-% IV SOLN
INTRAVENOUS | Status: AC
  Filled 2023-07-22: qty 200

## 2023-07-22 MED ORDER — POLYETHYLENE GLYCOL 3350 17 G PO PACK: 17.0000 g | PACK | Freq: Every day | ORAL | Status: DC | PRN

## 2023-07-22 MED ORDER — SODIUM CHLORIDE FLUSH 0.9 % IV SOLN
INTRAVENOUS | Status: AC
  Filled 2023-07-22: qty 20

## 2023-07-22 MED ORDER — SUCCINYLCHOLINE CHLORIDE 200 MG/10ML IV SOSY
PREFILLED_SYRINGE | INTRAVENOUS | Status: DC | PRN
  Administered 2023-07-22: 140 mg via INTRAVENOUS

## 2023-07-22 MED ORDER — DEXAMETHASONE SODIUM PHOSPHATE 10 MG/ML IJ SOLN
INTRAMUSCULAR | Status: AC
  Filled 2023-07-22: qty 1

## 2023-07-22 MED ORDER — PHENOL 1.4 % MT LIQD: 1.0000 | OROMUCOSAL | Status: DC | PRN

## 2023-07-22 MED ORDER — HYDROMORPHONE HCL 1 MG/ML IJ SOLN
INTRAMUSCULAR | Status: AC
  Filled 2023-07-22: qty 1

## 2023-07-22 MED ORDER — DEXMEDETOMIDINE HCL IN NACL 80 MCG/20ML IV SOLN
INTRAVENOUS | Status: DC | PRN
  Administered 2023-07-22: 12 ug via INTRAVENOUS

## 2023-07-22 MED ORDER — SORBITOL 70 % SOLN: 30.0000 mL | Freq: Every day | Status: DC | PRN

## 2023-07-22 MED ORDER — OXYCODONE HCL 5 MG PO TABS: 5.0000 mg | ORAL_TABLET | Freq: Once | ORAL | Status: DC | PRN

## 2023-07-22 MED ORDER — SODIUM CHLORIDE 0.9 % IV SOLN: 250.0000 mL | INTRAVENOUS | Status: AC

## 2023-07-22 MED ORDER — ACETAMINOPHEN 500 MG PO TABS
1000.0000 mg | ORAL_TABLET | Freq: Four times a day (QID) | ORAL | Status: DC
  Administered 2023-07-22 – 2023-07-23 (×4): 1000 mg via ORAL
  Filled 2023-07-22 (×4): qty 2

## 2023-07-22 MED ORDER — METHOCARBAMOL 500 MG PO TABS
500.0000 mg | ORAL_TABLET | Freq: Four times a day (QID) | ORAL | Status: DC
  Administered 2023-07-22 – 2023-07-23 (×4): 500 mg via ORAL
  Filled 2023-07-22 (×4): qty 1

## 2023-07-22 MED ORDER — BUPIVACAINE HCL (PF) 0.5 % IJ SOLN
INTRAMUSCULAR | Status: AC
  Filled 2023-07-22: qty 30

## 2023-07-22 MED ORDER — MORPHINE SULFATE (PF) 2 MG/ML IV SOLN
INTRAVENOUS | Status: AC
  Filled 2023-07-22: qty 1

## 2023-07-22 MED ORDER — PHENYLEPHRINE HCL-NACL 20-0.9 MG/250ML-% IV SOLN
INTRAVENOUS | Status: DC | PRN
  Administered 2023-07-22: 40 ug/min via INTRAVENOUS

## 2023-07-22 MED ORDER — SODIUM CHLORIDE 0.9% FLUSH: 3.0000 mL | INTRAVENOUS | Status: DC | PRN

## 2023-07-22 MED ORDER — ONDANSETRON HCL 4 MG/2ML IJ SOLN: 4.0000 mg | Freq: Once | INTRAMUSCULAR | Status: DC | PRN

## 2023-07-22 MED ORDER — KETOROLAC TROMETHAMINE 15 MG/ML IJ SOLN
INTRAMUSCULAR | Status: AC
  Filled 2023-07-22: qty 1

## 2023-07-22 MED ORDER — CEFAZOLIN SODIUM-DEXTROSE 2-4 GM/100ML-% IV SOLN
INTRAVENOUS | Status: AC
  Filled 2023-07-22: qty 100

## 2023-07-22 MED ORDER — SENNA 8.6 MG PO TABS
1.0000 | ORAL_TABLET | Freq: Two times a day (BID) | ORAL | Status: DC
  Administered 2023-07-22 – 2023-07-23 (×3): 8.6 mg via ORAL
  Filled 2023-07-22 (×2): qty 1

## 2023-07-22 MED ORDER — ONDANSETRON HCL 4 MG/2ML IJ SOLN
4.0000 mg | Freq: Four times a day (QID) | INTRAMUSCULAR | Status: DC | PRN
  Administered 2023-07-22: 4 mg via INTRAVENOUS

## 2023-07-22 MED ORDER — BUPIVACAINE-EPINEPHRINE 0.5% -1:200000 IJ SOLN
INTRAMUSCULAR | Status: DC | PRN
  Administered 2023-07-22: 20 mL

## 2023-07-22 MED ORDER — OXYCODONE HCL 5 MG/5ML PO SOLN: 5.0000 mg | Freq: Once | ORAL | Status: DC | PRN

## 2023-07-22 MED ORDER — ENOXAPARIN SODIUM 40 MG/0.4ML IJ SOSY
40.0000 mg | PREFILLED_SYRINGE | INTRAMUSCULAR | Status: DC
  Administered 2023-07-23: 40 mg via SUBCUTANEOUS
  Filled 2023-07-22: qty 0.4

## 2023-07-22 MED ORDER — ACETAMINOPHEN 10 MG/ML IV SOLN
INTRAVENOUS | Status: AC
  Filled 2023-07-22: qty 100

## 2023-07-22 MED ORDER — EPHEDRINE SULFATE-NACL 50-0.9 MG/10ML-% IV SOSY
PREFILLED_SYRINGE | INTRAVENOUS | Status: DC | PRN
  Administered 2023-07-22: 5 mg via INTRAVENOUS
  Administered 2023-07-22: 10 mg via INTRAVENOUS
  Administered 2023-07-22 (×2): 5 mg via INTRAVENOUS

## 2023-07-22 MED ORDER — 0.9 % SODIUM CHLORIDE (POUR BTL) OPTIME
TOPICAL | Status: DC | PRN
  Administered 2023-07-22: 500 mL

## 2023-07-22 MED ORDER — ONDANSETRON HCL 4 MG PO TABS: 4.0000 mg | ORAL_TABLET | Freq: Four times a day (QID) | ORAL | Status: DC | PRN

## 2023-07-22 MED ORDER — BUPIVACAINE-EPINEPHRINE (PF) 0.5% -1:200000 IJ SOLN
INTRAMUSCULAR | Status: AC
  Filled 2023-07-22: qty 20

## 2023-07-22 MED ORDER — KETOROLAC TROMETHAMINE 15 MG/ML IJ SOLN
15.0000 mg | Freq: Four times a day (QID) | INTRAMUSCULAR | Status: AC
  Administered 2023-07-22 – 2023-07-23 (×4): 15 mg via INTRAVENOUS
  Filled 2023-07-22 (×3): qty 1

## 2023-07-22 MED ORDER — OXYCODONE HCL 5 MG PO TABS: 5.0000 mg | ORAL_TABLET | ORAL | Status: DC | PRN

## 2023-07-22 MED ORDER — ACETAMINOPHEN 650 MG RE SUPP: 650.0000 mg | RECTAL | Status: DC | PRN

## 2023-07-22 MED ORDER — SODIUM CHLORIDE 0.9 % IV SOLN
INTRAVENOUS | Status: DC | PRN
  Administered 2023-07-22: .05 ug/kg/min via INTRAVENOUS

## 2023-07-22 MED ORDER — FENTANYL CITRATE (PF) 100 MCG/2ML IJ SOLN
INTRAMUSCULAR | Status: DC | PRN
  Administered 2023-07-22 (×2): 50 ug via INTRAVENOUS
  Administered 2023-07-22: 100 ug via INTRAVENOUS

## 2023-07-22 MED ORDER — DOCUSATE SODIUM 100 MG PO CAPS
ORAL_CAPSULE | ORAL | Status: AC
  Filled 2023-07-22: qty 1

## 2023-07-22 MED ORDER — SENNA 8.6 MG PO TABS
ORAL_TABLET | ORAL | Status: AC
  Filled 2023-07-22: qty 1

## 2023-07-22 MED ORDER — ACETAMINOPHEN 325 MG PO TABS: 650.0000 mg | ORAL_TABLET | ORAL | Status: DC | PRN

## 2023-07-22 MED ORDER — GLYCOPYRROLATE 0.2 MG/ML IJ SOLN
INTRAMUSCULAR | Status: DC | PRN
  Administered 2023-07-22: .2 mg via INTRAVENOUS

## 2023-07-22 MED ORDER — PROPOFOL 10 MG/ML IV BOLUS
INTRAVENOUS | Status: AC
  Filled 2023-07-22: qty 40

## 2023-07-22 MED ORDER — SENNOSIDES-DOCUSATE SODIUM 8.6-50 MG PO TABS
ORAL_TABLET | ORAL | Status: AC
  Filled 2023-07-22: qty 1

## 2023-07-22 MED ORDER — LIDOCAINE HCL (PF) 2 % IJ SOLN
INTRAMUSCULAR | Status: AC
  Filled 2023-07-22: qty 5

## 2023-07-22 MED ORDER — ACETAMINOPHEN 10 MG/ML IV SOLN: 1000.0000 mg | Freq: Once | INTRAVENOUS | Status: DC | PRN

## 2023-07-22 MED ORDER — MENTHOL 3 MG MT LOZG: 1.0000 | LOZENGE | OROMUCOSAL | Status: DC | PRN

## 2023-07-22 SURGICAL SUPPLY — 76 items
ALLOGRAFT BONESTRIP KORE 2.5X5 (Bone Implant) IMPLANT
BASIN KIT SINGLE STR (MISCELLANEOUS) ×2 IMPLANT
CHLORAPREP W/TINT 26 (MISCELLANEOUS) ×4 IMPLANT
CORD BIP STRL DISP 12FT (MISCELLANEOUS) ×2 IMPLANT
CORD LIGHT LATERIAL X LIFT (MISCELLANEOUS) IMPLANT
COVERAGE SUPPORT SPINE BRAINLB (MISCELLANEOUS)
DERMABOND ADVANCED .7 DNX12 (GAUZE/BANDAGES/DRESSINGS) ×6 IMPLANT
DRAPE C ARM PK CFD 31 SPINE (DRAPES) ×2 IMPLANT
DRAPE C-ARMOR (DRAPES) IMPLANT
DRAPE HD 5FT BACK TABLE (DRAPES) ×2 IMPLANT
DRAPE INCISE IOBAN 66X45 STRL (DRAPES) ×2 IMPLANT
DRAPE LAPAROTOMY 100X77 ABD (DRAPES) ×4 IMPLANT
DRAPE POUCH INSTRU U-SHP 10X18 (DRAPES) ×2 IMPLANT
DRAPE SCAN PATIENT (DRAPES) ×2 IMPLANT
DRAPE TABLE BACK 80X90 (DRAPES) ×2 IMPLANT
DRSG OPSITE 4X5.5 SM (GAUZE/BANDAGES/DRESSINGS) IMPLANT
DRSG OPSITE POSTOP 4X6 (GAUZE/BANDAGES/DRESSINGS) IMPLANT
DRSG OPSITE POSTOP 4X8 (GAUZE/BANDAGES/DRESSINGS) IMPLANT
DRSG TEGADERM 2-3/8X2-3/4 SM (GAUZE/BANDAGES/DRESSINGS) IMPLANT
DRSG TEGADERM 4X4.75 (GAUZE/BANDAGES/DRESSINGS) IMPLANT
DRSG TEGADERM 6X8 (GAUZE/BANDAGES/DRESSINGS) IMPLANT
ELECT REM PT RETURN 9FT ADLT (ELECTROSURGICAL) ×4
ELECTRODE REM PT RTRN 9FT ADLT (ELECTROSURGICAL) ×4 IMPLANT
EX-PIN ORTHOLOCK NAV 4X150 (PIN) ×2 IMPLANT
FEE CVG SUPP BRAINLAB NG SPNE (MISCELLANEOUS) IMPLANT
FEE INTRAOP CADWELL SUPPLY NCS (MISCELLANEOUS) IMPLANT
FEE INTRAOP MONITOR IMPULS NCS (MISCELLANEOUS) IMPLANT
FORCEPS BPLR BAYO 10IN 1.0TIP (ORTHOPEDIC DISPOSABLE SUPPLIES) IMPLANT
GAUZE 4X4 16PLY ~~LOC~~+RFID DBL (SPONGE) IMPLANT
GAUZE SPONGE 2X2 STRL 8-PLY (GAUZE/BANDAGES/DRESSINGS) IMPLANT
GLOVE BIOGEL PI IND STRL 6.5 (GLOVE) ×6 IMPLANT
GLOVE SURG SYN 6.5 ES PF (GLOVE) ×10
GLOVE SURG SYN 6.5 PF PI (GLOVE) ×10 IMPLANT
GLOVE SURG SYN 8.5 E (GLOVE) ×12
GLOVE SURG SYN 8.5 PF PI (GLOVE) ×12 IMPLANT
GOWN SRG LRG LVL 4 IMPRV REINF (GOWNS) ×4 IMPLANT
GOWN SRG XL LVL 3 NONREINFORCE (GOWNS) ×4 IMPLANT
GUIDEWIRE NITINOL BEVEL TIP (WIRE) IMPLANT
HOLDER FOLEY CATH W/STRAP (MISCELLANEOUS) ×2 IMPLANT
INTRAOP CADWELL SUPPLY FEE NCS (MISCELLANEOUS)
INTRAOP MONITOR FEE IMPULS NCS (MISCELLANEOUS)
JET LAVAGE IRRISEPT WOUND (IRRIGATION / IRRIGATOR) ×2
KIT DILATOR XLIF 5 (KITS) IMPLANT
KIT DISP MARS 3V (KITS) IMPLANT
KIT SPINAL PRONEVIEW (KITS) ×2 IMPLANT
KIT TURNOVER KIT A (KITS) ×2 IMPLANT
KNIFE BAYONET SHORT DISCETOMY (MISCELLANEOUS) IMPLANT
LAVAGE JET IRRISEPT WOUND (IRRIGATION / IRRIGATOR) ×2 IMPLANT
MANIFOLD NEPTUNE II (INSTRUMENTS) ×4 IMPLANT
MARKER SKIN DUAL TIP RULER LAB (MISCELLANEOUS) ×2 IMPLANT
MARKER SPHERE PSV REFLC 13MM (MARKER) ×14 IMPLANT
MODULE NVM5 NEXT GEN EMG (NEUROSURGERY SUPPLIES) IMPLANT
NDL SAFETY ECLIPSE 18X1.5 (NEEDLE) ×2 IMPLANT
NS IRRIG 500ML POUR BTL (IV SOLUTION) ×2 IMPLANT
PACK LAMINECTOMY ARMC (PACKS) ×2 IMPLANT
PAD ARMBOARD 7.5X6 YLW CONV (MISCELLANEOUS) ×2 IMPLANT
PENCIL SMOKE EVACUATOR (MISCELLANEOUS) ×2 IMPLANT
ROD RELINE MAS TI LORD 5.5X40 (Rod) IMPLANT
SCREW LOCK RELINE 5.5 TULIP (Screw) IMPLANT
SCREW RELINE RED 6.5X45MM POLY (Screw) IMPLANT
SPACER HEDRON 10D 18X45X13 (Spacer) IMPLANT
STAPLER SKIN PROX 35W (STAPLE) IMPLANT
SURGIFLO W/THROMBIN 8M KIT (HEMOSTASIS) ×2 IMPLANT
SUT ETHILON 3-0 FS-10 30 BLK (SUTURE)
SUT STRATA 3-0 15 PS-2 (SUTURE) ×4 IMPLANT
SUT VIC AB 0 CT1 27XCR 8 STRN (SUTURE) ×4 IMPLANT
SUT VIC AB 2-0 CT1 18 (SUTURE) ×4 IMPLANT
SUTURE EHLN 3-0 FS-10 30 BLK (SUTURE) IMPLANT
SYR 30ML LL (SYRINGE) ×4 IMPLANT
TAPE CLOTH 3X10 WHT NS LF (GAUZE/BANDAGES/DRESSINGS) ×8 IMPLANT
TOWEL OR 17X26 4PK STRL BLUE (TOWEL DISPOSABLE) ×4 IMPLANT
TRAP FLUID SMOKE EVACUATOR (MISCELLANEOUS) ×2 IMPLANT
TRAY FOLEY SLVR 16FR LF STAT (SET/KITS/TRAYS/PACK) ×2 IMPLANT
TUBING CONNECTING 10 (TUBING) ×2 IMPLANT
WATER STERILE IRR 1000ML POUR (IV SOLUTION) IMPLANT
WATER STERILE IRR 500ML POUR (IV SOLUTION) IMPLANT

## 2023-07-22 NOTE — Transfer of Care (Signed)
Immediate Anesthesia Transfer of Care Note  Patient: Voncile D Luff  Procedure(s) Performed: L4-5 LATERAL LUMBAR INTERBODY FUSION AND POSTERIOR SPINAL FUSION (Back) APPLICATION OF INTRAOPERATIVE CT SCAN  Patient Location: PACU  Anesthesia Type:General  Level of Consciousness: awake and confused  Airway & Oxygen Therapy: Patient Spontanous Breathing and Patient connected to face mask oxygen  Post-op Assessment: Report given to RN and Post -op Vital signs reviewed and stable  Post vital signs: Reviewed and stable  Last Vitals:  Vitals Value Taken Time  BP 170/114 07/22/23 1034  Temp    Pulse 97 07/22/23 1042  Resp 19 07/22/23 1043  SpO2 93 % 07/22/23 1042  Vitals shown include unfiled device data.  Last Pain:  Vitals:   07/22/23 0626  TempSrc: Temporal  PainSc: 10-Worst pain ever         Complications: No notable events documented.

## 2023-07-22 NOTE — Discharge Instructions (Signed)
  Your surgeon has performed an operation on your lumbar spine (low back) to relieve pressure on one or more nerves. Many times, patients feel better immediately after surgery and can "overdo it." Even if you feel well, it is important that you follow these activity guidelines. If you do not let your back heal properly from the surgery, you can increase the chance of hardware complications and/or return of your symptoms. The following are instructions to help in your recovery once you have been discharged from the hospital.  Do not use NSAIDs for 3 months after surgery.   Activity    No bending, lifting, or twisting ("BLT"). Avoid lifting objects heavier than 10 pounds (gallon milk jug).  Where possible, avoid household activities that involve lifting, bending, pushing, or pulling such as laundry, vacuuming, grocery shopping, and childcare. Try to arrange for help from friends and family for these activities while your back heals.  Increase physical activity slowly as tolerated.  Taking short walks is encouraged, but avoid strenuous exercise. Do not jog, run, bicycle, lift weights, or participate in any other exercises unless specifically allowed by your doctor. Avoid prolonged sitting, including car rides.  Talk to your doctor before resuming sexual activity.  You should not drive until cleared by your doctor.  Until released by your doctor, you should not return to work or school.  You should rest at home and let your body heal.   You may shower three days after your surgery.  After showering, lightly dab your incision dry. Do not take a tub bath or go swimming for 3 weeks, or until approved by your doctor at your follow-up appointment.  If you smoke, we strongly recommend that you quit.  Smoking has been proven to interfere with normal healing in your back and will dramatically reduce the success rate of your surgery. Please contact [facility] (800-QUIT-NOW) and use the resources at  www.[facility].com for assistance in stopping smoking.  Surgical Incision   If you have a dressing on your incision, you may remove it three days after your surgery. Keep your incision area clean and dry.  Your incision was closed with Dermabond glue. The glue should begin to peel away within about a week.  Diet            You may return to your usual diet. Be sure to stay hydrated.  When to Contact us  Although your surgery and recovery will likely be uneventful, you may have some residual numbness, aches, and pains in your back and/or legs. This is normal and should improve in the next few weeks.  However, should you experience any of the following, contact us immediately: New numbness or weakness Pain that is progressively getting worse, and is not relieved by your pain medications or rest Bleeding, redness, swelling, pain, or drainage from surgical incision Chills or flu-like symptoms Fever greater than 101.0 F (38.3 C) Problems with bowel or bladder functions Difficulty breathing or shortness of breath Warmth, tenderness, or swelling in your calf  Contact Information How to contact us:  If you have any questions/concerns before or after surgery, you can reach [place] at [phone], or you can send a mychart message. We can be reached by phone or mychart 8am-4pm, Monday-Friday.  *Please note: Calls after 4pm are forwarded to a third party answering service. Mychart messages are not routinely monitored during evenings, weekends, and holidays. Please call our office to contact the answering service for urgent concerns during non-business hours.

## 2023-07-22 NOTE — Interval H&P Note (Signed)
History and Physical Interval Note:  07/22/2023 7:00 AM  Diana Schultz  has presented today for surgery, with the diagnosis of M53.2X6 Spinal instability, lumbar M43.16 Spondylolisthesis of lumbar region M54.42, M54.41, G89.29 Chronic bilateral low back pain with right-sided sciatica M71.38 Synovial cyst of lumbar spine.  The various methods of treatment have been discussed with the patient and family. After consideration of risks, benefits and other options for treatment, the patient has consented to  Procedure(s): L4-5 LATERAL LUMBAR INTERBODY FUSION AND POSTERIOR SPINAL FUSION (N/A) APPLICATION OF INTRAOPERATIVE CT SCAN (N/A) as a surgical intervention.  The patient's history has been reviewed, patient examined, no change in status, stable for surgery.  I have reviewed the patient's chart and labs.  Questions were answered to the patient's satisfaction.    Heart sounds normal no MRG. Chest Clear to Auscultation Bilaterally.  [name]   Family History  Problem Relation Age of Onset   Cancer Mother    Hypertension Mother    Diabetes Mother    Stroke Mother        early in life   Endometrial cancer Mother    Hyperlipidemia Mother    Depression Mother    Mental retardation Father    Breast cancer Father    Depression Father    Hypertension Sister    Hypertension Brother    Heart failure Paternal Grandfather    CAD Other        GP, late in like   Colon cancer Neg Hx    Social History   Socioeconomic History   Marital status: Married    Spouse name: Susann Givens   Number of children: 2   Years of education: Not on file   Highest education level: Some college, no degree  Occupational History   Occupation: Lawyer, Scientist, clinical (histocompatibility and immunogenetics)  by training, does prn works  Tobacco Use   Smoking status: Never   Smokeless tobacco: Never  Vaping Use   Vaping status: Never Used  Substance and Sexual Activity   Alcohol use: No    Comment: rare    Drug use: No   Sexual activity: Not  Currently    Birth control/protection: Surgical  Other Topics Concern   Not on file  Social History Narrative   Original from [place], household: pt and husband   Lives at home with husband and children   Right handed   Caffeine: 1 cup sweet tea every day   Social Drivers of Health   Financial Resource Strain: Patient Declined (09/25/2022)   Received from [facility], [facility]   Overall Financial Resource Strain (CARDIA)    Difficulty of Paying Living Expenses: Patient declined  Food Insecurity: Patient Declined (09/25/2022)   Received from [facility], [facility]   Hunger Vital Sign    Worried About Running Out of Food in the Last Year: Patient declined    Ran Out of Food in the Last Year: Patient declined  Transportation Needs: Patient Declined (09/25/2022)   Received from [employer], [facility]   [facility] - Transportation    Lack of Transportation (Medical): Patient declined    Lack of Transportation (Non-Medical): Patient declined  Physical Activity: Unknown (09/25/2022)   Received from [facility]   Exercise Vital Sign    Days of Exercise per Week: 0 days    Minutes of Exercise per Session: Not on file  Recent Concern: Physical Activity - Inactive (09/25/2022)   Received from [facility], [facility]   Exercise Vital Sign  Days of Exercise per Week: 0 days    Minutes of Exercise per Session: 20 min  Stress: Patient Declined (09/25/2022)   Received from [facility], [facility] Occup[facility] - Occupational Stress Questionnaire    Feeling of Stress : Patient declined  Social Connections: Socially Integrated (06/14/2022)   Received from [facility], [facility]   Social Network    How would you rate your social network (family, work, friends)?: Good participation with social networks  Intimate Partner Violence: Not At Risk (09/25/2022)   Received from [facility], [facility]   HITS    Over the last 12 months how often  did your partner physically hurt you?: Never    Over the last 12 months how often did your partner insult you or talk down to you?: Never    Over the last 12 months how often did your partner threaten you with physical harm?: Never    Over the last 12 months how often did your partner scream or curse at you?: Never   Current Meds  Medication Sig   HYDROcodone-acetaminophen (NORCO/VICODIN) 5-325 MG tablet Take 1 tablet by mouth every 6 (six) hours as needed (pain.).   MAGNESIUM GLYCINATE PO Take 1 tablet by mouth at bedtime.   tirzepatide (MOUNJARO) 15 MG/0.5ML Pen Inject 15 mg into the skin once a week. (Patient not taking: Reported on 07/14/2023)   Allergies  Allergen Reactions   Amlodipine Cough and Swelling    Other reaction(s): Cough (ALLERGY/intolerance)    Metoprolol Swelling   Benicar [Olmesartan] Cough   Hydrochlorothiazide Cough   Lactose Intolerance (Gi)    Lisinopril Cough   Nebivolol     Other reaction(s): Other (See Comments) bradycardia   Semaglutide(0.25 Or 0.5mg -Dos) Itching    Itching all over

## 2023-07-22 NOTE — Plan of Care (Signed)

## 2023-07-22 NOTE — Anesthesia Procedure Notes (Signed)
Procedure Name: Intubation Date/Time: 07/22/2023 7:39 AM  Performed by: Malva Cogan, CRNAPre-anesthesia Checklist: Patient identified, Patient being monitored, Timeout performed, Emergency Drugs available and Suction available Patient Re-evaluated:Patient Re-evaluated prior to induction Oxygen Delivery Method: Circle system utilized Preoxygenation: Pre-oxygenation with 100% oxygen Induction Type: IV induction Ventilation: Mask ventilation without difficulty Laryngoscope Size: 3 and McGrath Grade View: Grade I Tube type: Oral Tube size: 6.5 mm Number of attempts: 1 Airway Equipment and Method: Stylet and Video-laryngoscopy Placement Confirmation: ETT inserted through vocal cords under direct vision, positive ETCO2 and breath sounds checked- equal and bilateral Secured at: 21 cm Tube secured with: Tape Dental Injury: Teeth and Oropharynx as per pre-operative assessment

## 2023-07-22 NOTE — Op Note (Signed)
Indications: Ms. Rifka Ramey is a 58 y.o. female with M53.2X6 Spinal instability, lumbar, M43.16 Spondylolisthesis of lumbar region, M54.42, M54.41, G89.29 Chronic bilateral low back pain with right-sided sciatica, M71.38 Synovial cyst of lumbar spine   She failed conservative management prompting surgical intervention.  Findings: expansion of disc space  Preoperative Diagnosis: M53.2X6 Spinal instability, lumbar, M43.16 Spondylolisthesis of lumbar region, M54.42, M54.41, G89.29 Chronic bilateral low back pain with right-sided sciatica, M71.38 Synovial cyst of lumbar spine  Postoperative Diagnosis: same   EBL: 50 ml IVF: see anesthesia record Drains: none Disposition: Extubated and Stable to PACU Complications: none  A foley catheter was placed.   Preoperative Note:   Risks of surgery discussed include: infection, bleeding, stroke, coma, death, paralysis, CSF leak, nerve/spinal cord injury, numbness, tingling, weakness, complex regional pain syndrome, recurrent stenosis and/or disc herniation, vascular injury, development of instability, neck/back pain, need for further surgery, persistent symptoms, development of deformity, and the risks of anesthesia. The patient understood these risks and agreed to proceed.  NAME OF ANTERIOR PROCEDURE:               1. Anterior lumbar interbody fusion via a right lateral retroperitoneal approach at L4/5 2. Placement of a Lordotic Globus Hedron interbody cage, filled with Demineralized Bone Matrix  NAME OF POSTERIOR PROCEDURE 1. Posterior instrumentation using Nuvasive Reline Instrumentation 2. Posterolateral fusion, L4/5, utilizing demineralized bone matrix 3. Use of Stereotaxis   PROCEDURE:  Patient was brought to the operating room, intubated, turned to the lateral position.  All pressure points were checked and double-checked.  The patient was prepped and draped in the standard fashion. Prior to prepping, fluoroscopy was brought in and the  patient was positioned with a large bump under the contralateral side between the iliac crest and rib cage, allowing the area between the iliac crest and the lateral aspect of the rib cage to open and increase the ability to reach inferiorly, to facilitate entry into the disc space.  The incision was marked upon the skin both the location of the disc space as well as the superior most aspect of the iliac crest.  Based on the identification of the disc space an incision was prepared, marked upon the skin and eventually was used for our lateral incision.  The fluoroscopy was turned into a cross table A/P image in order to confirm that the patient's spine remained in a perpendicular trajectory to the floor without rotation.  Once confirming that all the pressure points were checked and double-checked and the patient remained in sturdy position strapped down in this slightly jack-knifed lateral position, the patient was prepped and draped in standard fashion.  The skin was injected with local anesthetic, then incised until the abdominal wall fascia was noted.  I bluntly dissected posteriorly until we were able to identify the posterior musculature near petit's triangle.  At this point, using primarily blunt dissection with our finger aided with a metzenbaum scissor, were able to enter the retroperitoneal cavity.  The retroperitoneal potential space was opened further until palpating out the psoas muscle, the medial aspect of the iliac crest, the medial aspect of the last rib and continued to define the retroperitoneal space with blunt dissection in order to facilitate safe placement of our dilators.    While protecting by dissecting directly onto a finger in the retroperitoneum, the retroperitoneal space was entered safely from the lateral incision and the initial dilator placed onto the muscle belly of the psoas.  While directly stimulating the dilator  and after radiographically confirming our location relative to  the disc space, I placed the dilator through the psoas.  The dilators were stimulated to ensure remaining safely away from any of the lumbar plexus nerves; the dilators were repositioned until no pathologic stimulation was appreciated.  Once I had confirmed the location of our initial dilator radiographically, a K-wire secured the dilator into the L4/5 disc space and confirmed position under A/P and lateral fluoroscopy.  At this point, I dilated up with direct stimulation to confirm lack of pathologic stimulation.  Once all the dilators were in position, I placed in the retractor and secured it onto the table, locked into position and confirmed under A/P and lateral fluoroscopy to confirm our approach angle to the disc space as well as location relative to the disc space.  I then placed the muscle stimulator in through the working channel down to the vertebral body, stimulating the entire lateral surface of the vertebral body and any of the visualized psoas muscle that was adjacent to the retractor, confirming again the safe passage to the psoas before we began performing the discectomy.  At this point, we began our discectomy at L4/5.  The disc was incised laterally throughout the extent of our exposure. Using a combination of pituitary rongeurs, Kerrison rongeurs, rasps, curettes of various sorts, we were able to begin to clean out the disc space.  Once we had cleaned out the majority of the disc space, we then cut the lateral annulus with a cob, breaking the lateral annual attachments on the contralateral side by subtly working the cob through the annulus while using flouroscopy.  Care was taken not to extend further than required after cutting the annular attachments.  After this had been performed, we prepared the endplates for placement of our graft, sized a graft to the disc space by serially dilating up in trial sizes until we confirmed that our graft would be well positioned, allowing distraction while  maintaining good grip.  This was confirmed under A/P and lateral fluoroscopy in order to ensure its placement as an eventual trial for placement of our final graft.  We irrigated with saline.  Once confirmed placement, the Hedron implant filled with allograft was impacted into position at L4/5.   Through a combination of intradiscal distraction and anterior releasing, we were able to correct the anterior deformity during disc preparation and placement of the graft.  At this point, final radiographs were performed, and we began closure.  The wound was closed using 0 Vicryl interrupted suture in the fascia and 2-0 Vicryl inverted suture were placed in the subcutaneous tissue and dermis. 3-0 monocryl was used for final closure. Dermabond was used to close the skin.    After closing the anterior part in layers, the patient was repositioned into prone position.  All pressure points were checked and double-checked.  The posterior operative site was prepped and draped in standard fashion.  The stereotactic array was placed.  Stereotactic images were acquired using intraoperative CT scanning.  This was registered to the patient.  Using stereotaxis, screw trajectories were planned and incisions made.  The pedicles from L4 to L5 were cannulated bilaterally and K wires used to secure the tracks.  We then utilized a stereotactic screwdriver to place pedicle screws from L4 to L5.  At each level, Nuvasive Reline pedicle screws were placed.  Once the screws were placed, the screw extensions were then linked, a path was formed for the rod and a rod was  utilized to connect the screws.  We then compressed, torqued / counter-torqued and removed the screw assembly. Once performed on each side, the C-arm was brought back and to take confirmatory CT scan showing appropriate placement of all instrumentation and anatomic alignment.    Posterolateral arthrodesis was performed at L4-L5 utilizing demineralized bone  matrix.  We irrigated each incision and obtained hemostasis. Each wound was closed using 0 Vicryl interrupted suture in the fascia, 2-0 Vicryl inverted suture were placed in the subcutaneous tissue and dermis. 3-0 monocryl was used for final closure. Dermabond was used to close the skin.    Needle, lap and all counts were correct at the end of the case.     Manning Charity PA assisted in the entire procedure. An assistant was required for this procedure due to the complexity.  The assistant provided assistance in tissue manipulation and suction, and was required for the successful and safe performance of the procedure. I performed the critical portions of the procedure.   Venetia Night MD Neurosurgery

## 2023-07-22 NOTE — Anesthesia Postprocedure Evaluation (Signed)
Anesthesia Post Note  Patient: Averyanna D Gintz  Procedure(s) Performed: L4-5 LATERAL LUMBAR INTERBODY FUSION AND POSTERIOR SPINAL FUSION (Back) APPLICATION OF INTRAOPERATIVE CT SCAN  Patient location during evaluation: PACU Anesthesia Type: General Level of consciousness: awake and alert Pain management: pain level controlled Vital Signs Assessment: post-procedure vital signs reviewed and stable Respiratory status: spontaneous breathing, nonlabored ventilation, respiratory function stable and patient connected to nasal cannula oxygen Cardiovascular status: blood pressure returned to baseline and stable Postop Assessment: no apparent nausea or vomiting Anesthetic complications: no   No notable events documented.   Last Vitals:  Vitals:   07/22/23 1130 07/22/23 1145  BP: (!) 162/83 (!) 169/78  Pulse: 81 92  Resp: (!) 8 11  Temp:    SpO2: 97% 99%    Last Pain:  Vitals:   07/22/23 1157  TempSrc:   PainSc: 7                  Corinda Gubler

## 2023-07-22 NOTE — Anesthesia Preprocedure Evaluation (Signed)
Anesthesia Evaluation  Patient identified by MRN, date of birth, ID band Patient awake    Reviewed: Allergy & Precautions, NPO status , Patient's Chart, lab work & pertinent test results  History of Anesthesia Complications Negative for: history of anesthetic complications  Airway Mallampati: II  TM Distance: >3 FB Neck ROM: Full    Dental  (+) Partial Upper   Pulmonary sleep apnea and Continuous Positive Airway Pressure Ventilation , neg COPD, Patient abstained from smoking.Not current smoker   Pulmonary exam normal breath sounds clear to auscultation       Cardiovascular Exercise Tolerance: Good METShypertension, Pt. on medications (-) CAD and (-) Past MI (-) dysrhythmias  Rhythm:Regular Rate:Normal - Systolic murmurs    Neuro/Psych  PSYCHIATRIC DISORDERS  Depression     Neuromuscular disease    GI/Hepatic ,neg GERD  ,,(+)     (-) substance abuse    Endo/Other  diabetes, Well Controlled  Class 3 obesity  Renal/GU negative Renal ROS     Musculoskeletal   Abdominal  (+) + obese  Peds  Hematology   Anesthesia Other Findings Past Medical History: No date: Adrenal adenoma No date: Anemia No date: Arteriovenous malformation No date: Chronic RLQ pain No date: Class 3 obesity with alveolar hypoventilation and body mass  index (BMI) of 50.0 to 59.9 in adult [facility]) No date: Deep dyspareunia No date: Depression No date: Diverticulitis No date: DVT (deep venous thrombosis) (HCC)     Comment:  Hx at age 34 blood clot in right leg, d/c birth control               pills and no other problems No date: Fatty liver No date: Gallbladder disease No date: Hypertension     Comment:  history,lost weight, controlled with diet and exercise,               no current med No date: Lactose intolerance No date: Lumbar radiculopathy No date: Morbid obesity (HCC) No date: Osteoarthritis of left hip No date: Ovarian cyst No date:  Post-menopausal No date: Prediabetes No date: Pulmonary arteriovenous fistula (HCC) No date: Sleep apnea     Comment:  not currently using C-pap since having back issues due               to getting up multiple times during the night No date: Spondylolisthesis at L4-L5 level No date: Thalassemia No date: Type 2 diabetes mellitus without complication, without long- term current use of insulin (HCC) No date: Umbilical hernia 1984, 1989: Vaginal delivery No date: Vitamin D deficiency  Reproductive/Obstetrics                             Anesthesia Physical Anesthesia Plan  ASA: 3  Anesthesia Plan: General   Post-op Pain Management: Ofirmev IV (intra-op)*   Induction: Intravenous  PONV Risk Score and Plan: 4 or greater and Ondansetron, Dexamethasone, Midazolam, TIVA and Propofol infusion  Airway Management Planned: Oral ETT and Video Laryngoscope Planned  Additional Equipment: None  Intra-op Plan:   Post-operative Plan: Extubation in OR  Informed Consent: I have reviewed the patients History and Physical, chart, labs and discussed the procedure including the risks, benefits and alternatives for the proposed anesthesia with the patient or authorized representative who has indicated his/her understanding and acceptance.     Dental advisory given  Plan Discussed with: CRNA and Surgeon  Anesthesia Plan Comments: (Discussed risks of anesthesia with patient, including PONV, sore  throat, lip/dental/eye damage. Rare risks discussed as well, such as cardiorespiratory and neurological sequelae, and allergic reactions. Discussed the role of CRNA in patient's perioperative care. Patient understands. Patient informed about increased incidence of above perioperative risk due to high BMI. Patient understands.  )       Anesthesia Quick Evaluation

## 2023-07-22 NOTE — Progress Notes (Signed)
Pt was crying and yelling that her foley cath was hurting. She stated that she felt like she was having spasms. She was yelling that if we did not take it out she would pull it out. Explained to the pt that if she did not void in 6 hours we would have to cath her and she was alright with that. After removing the foley pt asked for assistance to the toilet. Pt void and had a small bowel movement.

## 2023-07-23 MED ORDER — METHOCARBAMOL 500 MG PO TABS: 500.0000 mg | ORAL_TABLET | Freq: Four times a day (QID) | ORAL | 0 refills | Status: DC

## 2023-07-23 MED ORDER — OXYCODONE HCL 5 MG PO TABS: 5.0000 mg | ORAL_TABLET | ORAL | 0 refills | Status: DC | PRN

## 2023-07-23 MED ORDER — SENNA 8.6 MG PO TABS: 1.0000 | ORAL_TABLET | Freq: Two times a day (BID) | ORAL | 0 refills | Status: DC | PRN

## 2023-07-23 NOTE — Plan of Care (Signed)

## 2023-07-23 NOTE — Progress Notes (Signed)
Physical Therapy Evaluation Patient Details Name: Diana Schultz MRN: [id number] DOB: 1966-04-30 Today's Date: 07/23/2023  History of Present Illness  [name] is a 58 y.o. female with PMH including Adrenal incidentaloma, HTN, anemia, and pre-diabetes. Patient is s/p L4-5 lateral interbody and posterior spinal fusion.   Clinical Impression  Orders Received, Chart Reviewed. Patient supine in bed upon arrival, agreeable to PT evaluation this date. Patient was IND with Mobility, ADLs/IADLs prior to admission w/o AD use. Patient lives with spouse, in multi level home with level entry. Was working and driving PTA. Pt is able to stay on main level.   On evaluation, patient was Mod I for bed mobility and transfers without use of AD. Good stability, no overt LOB. Able to maintain precautions consistently with mobility. Patient able to ambulate ~ 160 ft w/o AD with supervision. Patient denies pain after mobility. Patient left with all needs in reach. Patient will benefit from skilled acute PT services to address functional impairments (see below for additional) and maximize functional mobility. Do not anticipate the need for follow up PT services upon acute hospital discharge. Will continue to follow acutely.          If plan is discharge home, recommend the following: Help with stairs or ramp for entrance   Can travel by private vehicle        Equipment Recommendations None recommended by PT  Recommendations for Other Services       Functional Status Assessment Patient has had a recent decline in their functional status and demonstrates the ability to make significant improvements in function in a reasonable and predictable amount of time.     Precautions / Restrictions Precautions Precautions: Back Precaution Booklet Issued: Yes (comment) Precaution Comments: Spinal Precautions; Educated on No Bending, Lifting, Twisting. Required Braces or Orthoses:  (no brace needed per  order) Restrictions Weight Bearing Restrictions Per Provider Order: No      Mobility  Bed Mobility Overal bed mobility: Modified Independent             General bed mobility comments: MOD I; able to complete log roll properly without cues. Denies pain.    Transfers Overall transfer level: Modified independent Equipment used: None               General transfer comment: Pt able to complete STS from EOB MOD I.    Ambulation/Gait Ambulation/Gait assistance: Supervision Gait Distance (Feet): 160 Feet Assistive device: None Gait Pattern/deviations: Step-through pattern, Narrow base of support       General Gait Details: Pt able to ambulate x 160 ft with supervision with no AD. Patient with narrow BOS overall with ambulation. Pt demo good posture throughout. No evidence of imbalance/overt LOB  Stairs            Wheelchair Mobility     Tilt Bed    Modified Rankin (Stroke Patients Only)       Balance Overall balance assessment: Modified Independent                                           Pertinent Vitals/Pain Pain Assessment Pain Assessment: 0-10 Pain Score: 3  Pain Location: Low Back Pain Descriptors / Indicators: Shooting, Aching Pain Intervention(s): Limited activity within patient's tolerance, Monitored during session    Home Living Family/patient expects to be discharged to:: Private residence Living Arrangements: Spouse/significant other Available  Help at Discharge: Family Type of Home: House Home Access: Level entry     Alternate Level Stairs-Number of Steps: 13 Home Layout: Multi-level;Able to live on main level with bedroom/bathroom Home Equipment: Shower seat;Grab bars - tub/shower;Hand held shower head      Prior Function Prior Level of Function : Independent/Modified Independent;Working/employed;Driving             Mobility Comments: IND with mobility; Still working and driving. ADLs Comments: IND with  ADLs/IADLs     Extremity/Trunk Assessment   Upper Extremity Assessment Upper Extremity Assessment: Overall WFL for tasks assessed    Lower Extremity Assessment Lower Extremity Assessment: Overall WFL for tasks assessed    Cervical / Trunk Assessment Cervical / Trunk Assessment: Back Surgery  Communication   Communication Communication: No apparent difficulties  Cognition Arousal: Alert Behavior During Therapy: WFL for tasks assessed/performed Overall Cognitive Status: Within Functional Limits for tasks assessed                                          General Comments      Exercises Other Exercises Other Exercises: Educated on maintaining spinal precautions with mobility   Assessment/Plan    PT Assessment Patient needs continued PT services  PT Problem List Decreased range of motion;Decreased activity tolerance;Decreased mobility;Pain       PT Treatment Interventions DME instruction;Gait training;Stair training;Functional mobility training;Therapeutic exercise;Therapeutic activities;Balance training    PT Goals (Current goals can be found in the Care Plan section)  Acute Rehab PT Goals Patient Stated Goal: Get Back Home and Back to Work PT Goal Formulation: With patient Time For Goal Achievement: 08/06/23 Potential to Achieve Goals: Good    Frequency Min 1X/week     Co-evaluation               AM-PAC PT "6 Clicks" Mobility  Outcome Measure Help needed turning from your back to your side while in a flat bed without using bedrails?: None Help needed moving from lying on your back to sitting on the side of a flat bed without using bedrails?: None Help needed moving to and from a bed to a chair (including a wheelchair)?: None Help needed standing up from a chair using your arms (e.g., wheelchair or bedside chair)?: None Help needed to walk in hospital room?: None Help needed climbing 3-5 steps with a railing? : A Little 6 Click Score:  23    End of Session Equipment Utilized During Treatment: Gait belt Activity Tolerance: Patient tolerated treatment well Patient left: in bed;with call bell/phone within reach Nurse Communication: Mobility status PT Visit Diagnosis: Other abnormalities of gait and mobility (R26.89)    Time: 1610-9604 PT Time Calculation (min) (ACUTE ONLY): 14 min   Charges:   PT Evaluation $PT Eval Low Complexity: 1 Low   PT General Charges $$ ACUTE PT VISIT: 1 Visit         Creed Copper Fairly, PT, DPT 07/23/23 9:30 AM

## 2023-07-23 NOTE — Progress Notes (Signed)
Attending Progress Note  History: Eriana D Wrightson is here for lateral approach to L4-5 interbody fusion with posterior instrumentation.  They are currently 1 Day Post-Op.   POD1: No issues overnight, this morning on examination has continued pain as expected.  However, states that she is doing much better than she did with her first surgery.  She has been up and walking.  Pain control is doing well.  She is not having any postoperative issues.  At this time she is motivated to go home.  Has worked with PT and OT and did well  Physical Exam: Vitals:   07/23/23 0544 07/23/23 0836  BP: 134/69 (!) 156/72  Pulse: 69 (!) 58  Resp: 18 18  Temp: 97.7 F (36.5 C) 98.1 F (36.7 C)  SpO2: 95% 99%    AA Ox3 CNI  Strength:5/5 throughout   Clean dry and intact incisions with dressings over place.  Data:  No results for input(s): "NA", "K", "CL", "CO2", "BUN", "CREATININE", "LABGLOM", "GLUCOSE", "CALCIUM" in the last 168 hours. No results for input(s): "AST", "ALT", "ALKPHOS" in the last 168 hours.  Invalid input(s): "TBILI"   No results for input(s): "WBC", "HGB", "HCT", "PLT" in the last 168 hours. No results for input(s): "APTT", "INR" in the last 168 hours.      DG Lumbar Spine 2-3 Views Result Date: 07/22/2023 CLINICAL DATA:  Elective surgery.  L4-L5 fusion. EXAM: LUMBAR SPINE - 2-3 VIEW COMPARISON:  Preoperative imaging. FINDINGS: Four fluoroscopic spot views of the lumbar spine submitted from the operating room. Intraoperative 3D images. Posterior rod with intrapedicular screw fusion and interbody spacer in the lower lumbar spine. Fluoroscopy time 4 traditional C-arm 28 seconds, dose 38.383 mGy. Fluoroscopy time for 3D C-arm 1 minute, fluoroscopy time to a 3.54 mGy. IMPRESSION: Intraoperative fluoroscopy during lumbar spine surgery. Electronically Signed   By: Narda Rutherford M.D.   On: 07/22/2023 10:54   DG C-Arm 1-60 Min-No Report Result Date: 07/22/2023 Fluoroscopy was utilized by  the requesting physician.  No radiographic interpretation.   DG C-Arm 1-60 Min-No Report Result Date: 07/22/2023 Fluoroscopy was utilized by the requesting physician.  No radiographic interpretation.   DG C-Arm 1-60 Min-No Report Result Date: 07/22/2023 Fluoroscopy was utilized by the requesting physician.  No radiographic interpretation.    Other tests/results: No other results pending  Assessment/Plan:  Lamyra D Rada is a very pleasant woman with a history of an L4-5 interbody fusion from a lateral approach performed on 07/22/2023. They are currently 1 Day Post-Op  - Drains: None - mobilize - pain control - DVT prophylaxis - PTOT, did well on evaluation, will discuss with PT and OT about potential discharge today.  Patient is motivated to go home.  Lovenia Kim, MD/MSCR Department of Neurosurgery

## 2023-07-23 NOTE — Evaluation (Signed)
Occupational Therapy Evaluation Patient Details Name: Diana Schultz MRN: [id number] DOB: July 31, 1965 Today's Date: 07/23/2023   History of Present Illness Pt. is a 58 y.o. female with PMH including Adrenal incidentaloma, HTN, anemia, and pre-diabetes. Patient is s/p L4-5 lateral interbody and posterior spinal fusion.   Clinical Impression   Pt. presents with  3/10 pain, and limited ROM which hinders her ability to complete basic ADL and IADL functioning. Pt. resides at home with her husband. Pt. was independent with ADLs, and IADL functioning: including meal preparation, and medication management. Pt. was able to drive and was working as a one on one Agricultural consultant. Pt. is modified independent with cues initially for log rolling during supine to sit to the EOB, independent with transfers. Pt. education was provided about A/E use for LE ADLs. Pt. was able to independently demonstrate proper technique. Pt. education was provided about toilet aides 2/2 limited reach posteriorly during toileting care. Pt. could benefit from OT services while in acute care for ADL training, A/E training, and pt. education about home modification, and DME. No follow-up OT services are warranted upon return home.            If plan is discharge home, recommend the following: A little help with bathing/dressing/bathroom;Assistance with cooking/housework    Functional Status Assessment  Patient has had a recent decline in their functional status and demonstrates the ability to make significant improvements in function in a reasonable and predictable amount of time.  Equipment Recommendations       Recommendations for Other Services       Precautions / Restrictions Precautions Precautions: Back Precaution Booklet Issued: Yes (comment) Precaution Comments: Spinal Precautions:  No Bending, Lifting, Twisting. Required Braces or Orthoses:  (no brace needed per order) Restrictions Weight Bearing  Restrictions Per Provider Order: No      Mobility Bed Mobility Overal bed mobility: Modified Independent             General bed mobility comments: log rolling to the EOB with bed rails.    Transfers Overall transfer level: Independent Equipment used: None                      Balance Overall balance assessment: Independent                                         ADL either performed or assessed with clinical judgement   ADL Overall ADL's : Needs assistance/impaired Eating/Feeding: Independent;Set up   Grooming: Set up;Independent           Upper Body Dressing : Independent;Set up   Lower Body Dressing: Minimal assistance;With adaptive equipment;Set up   Toilet Transfer: Independent     Toileting - Clothing Manipulation Details (indicate cue type and reason): Difficulty reaching posteriorly to perform hygiene     Functional mobility during ADLs: Independent General ADL Comments: No assistive device needed for mobility     Vision Baseline Vision/History: 1 Wears glasses (For reading)       Perception         Praxis         Pertinent Vitals/Pain Pain Assessment Pain Assessment: 0-10 Pain Score: 3  Pain Location: Low Back Pain Descriptors / Indicators: Aching Pain Intervention(s): Limited activity within patient's tolerance, Monitored during session     Extremity/Trunk Assessment Upper Extremity Assessment Upper Extremity Assessment: Overall [facility]  for tasks assessed   Lower Extremity Assessment Lower Extremity Assessment: Overall WFL for tasks assessed   Cervical / Trunk Assessment Cervical / Trunk Assessment: Back Surgery   Communication Communication Communication: No apparent difficulties Cueing Techniques: Verbal cues;Tactile cues   Cognition Arousal: Alert Behavior During Therapy: WFL for tasks assessed/performed Overall Cognitive Status: Within Functional Limits for tasks assessed                                        General Comments       Exercises     Shoulder Instructions      Home Living Family/patient expects to be discharged to:: Private residence Living Arrangements: Spouse/significant other Available Help at Discharge: Family Type of Home: House Home Access: Level entry     Home Layout: Multi-level;Able to live on main level with bedroom/bathroom Alternate Level Stairs-Number of Steps: 13 Alternate Level Stairs-Rails: Right Bathroom Shower/Tub: Producer, television/film/video: Standard Bathroom Accessibility: Yes   Home Equipment: Shower seat;Grab bars - tub/shower;Hand held shower head          Prior Functioning/Environment Prior Level of Function : Independent/Modified Independent;Working/employed;Driving             Mobility Comments: Independent ADLs Comments: Independent with ADLs/IADLs, driving, and working        OT Problem List: Decreased range of motion;Decreased knowledge of use of DME or AE      OT Treatment/Interventions: Self-care/ADL training;DME and/or AE instruction;Patient/family education    OT Goals(Current goals can be found in the care plan section) Acute Rehab OT Goals Patient Stated Goal: To regain independence OT Goal Formulation: With patient Time For Goal Achievement: 07/23/23 Potential to Achieve Goals: Good  OT Frequency: Min 1X/week    Co-evaluation              AM-PAC OT "6 Clicks" Daily Activity     Outcome Measure Help from another person eating meals?: None Help from another person taking care of personal grooming?: None Help from another person toileting, which includes using toliet, bedpan, or urinal?: A Little Help from another person bathing (including washing, rinsing, drying)?: A Little Help from another person to put on and taking off regular upper body clothing?: None Help from another person to put on and taking off regular lower body clothing?: A Little 6 Click Score: 21   End  of Session    Activity Tolerance: Patient tolerated treatment well Patient left: in bed;with call bell/phone within reach  OT Visit Diagnosis: Pain Pain - part of body:  (back)                Time: 4098-1191 OT Time Calculation (min): 20 min Charges:  OT General Charges $OT Visit: 1 Visit OT Evaluation $OT Eval Moderate Complexity: 1 Mod [name], MS, OTR/L   [name] 07/23/2023, 10:16 AM

## 2023-07-23 NOTE — Discharge Summary (Signed)
[facility] Discharge Summary   Patient: Diana Schultz QIO:[id number] DOB: 03-Aug-1965 PCP: Lind Covert, MD  Date of admission: 07/22/2023  Date of Surgery: 07/22/2023  Surgery:  L4-5 LATERAL LUMBAR INTERBODY FUSION AND POSTERIOR SPINAL FUSION (N/A) APPLICATION OF INTRAOPERATIVE CT SCAN (N/A)  Date of discharge: 07/23/23  1 Day Post-Op  Discharge Condition: Fair  Recommendations at discharge:  Please refer to your discharge instructions.  Will plan to see you at your follow-up.  Discharge Diagnoses: Principal Problem:   S/P lumbar fusion Active Problems:   Spinal instability, lumbar   Spondylolisthesis of lumbar region   Chronic bilateral low back pain with right-sided sciatica   Synovial cyst of lumbar facet joint  Resolved Problems:   * No resolved hospital problems. [facility] Course: The patient underwent a L4-5 lateral interbody fusion with a right sided approach and posterior percutaneous spinal fixation on 07/22/2023 for spinal instability spondylolisthesis chronic back pain and a synovial cyst.  She was up walking on postoperative day 0 and her pain was well-controlled on postoperative day 1.  She was eating drinking and tolerating oral pain medications.  She was motivated and worked hard with physical and Occupational Therapy on postoperative day 1 and was cleared for discharge.  She is motivated to go home and requesting discharge home with follow-up in clinic.  Consultants: Physical and Occupational Therapy Additional Procedures: None  Exam: Vitals:   07/23/23 0836  BP: (!) 156/72  Pulse: (!) 58  Resp: 18  Temp: 98.1 F (36.7 C)  SpO2: 99%   The physical exam is generally normal. Patient appears well, alert and oriented x 3, pleasant, cooperative. Vitals are as noted. Abdomen is soft, no tenderness. Extremities are normal.   Neurological exam is intact at her preoperative baseline with no new deficits. Incision is covered with dressings on the  lateral incision and posterior incisions.   The results of significant diagnostics from this hospitalization (including imaging, microbiology, ancillary and laboratory) are listed below for reference.  Imaging Studies: DG Lumbar Spine 2-3 Views Result Date: 07/22/2023 CLINICAL DATA:  Elective surgery.  L4-L5 fusion. EXAM: LUMBAR SPINE - 2-3 VIEW COMPARISON:  Preoperative imaging. FINDINGS: Four fluoroscopic spot views of the lumbar spine submitted from the operating room. Intraoperative 3D images. Posterior rod with intrapedicular screw fusion and interbody spacer in the lower lumbar spine. Fluoroscopy time 4 traditional C-arm 28 seconds, dose 38.383 mGy. Fluoroscopy time for 3D C-arm 1 minute, fluoroscopy time to a 3.54 mGy. IMPRESSION: Intraoperative fluoroscopy during lumbar spine surgery. Electronically Signed   By: Narda Rutherford M.D.   On: 07/22/2023 10:54   DG C-Arm 1-60 Min-No Report Result Date: 07/22/2023 Fluoroscopy was utilized by the requesting physician.  No radiographic interpretation.   DG C-Arm 1-60 Min-No Report Result Date: 07/22/2023 Fluoroscopy was utilized by the requesting physician.  No radiographic interpretation.   DG C-Arm 1-60 Min-No Report Result Date: 07/22/2023 Fluoroscopy was utilized by the requesting physician.  No radiographic interpretation.   MM 3D SCREENING MAMMOGRAM BILATERAL BREAST Result Date: 06/27/2023 CLINICAL DATA:  Screening. EXAM: DIGITAL SCREENING BILATERAL MAMMOGRAM WITH TOMOSYNTHESIS AND CAD TECHNIQUE: Bilateral screening digital craniocaudal and mediolateral oblique mammograms were obtained. Bilateral screening digital breast tomosynthesis was performed. The images were evaluated with computer-aided detection. COMPARISON:  Previous exam(s). ACR Breast Density Category b: There are scattered areas of fibroglandular density. FINDINGS: There are no findings suspicious for malignancy. IMPRESSION: No mammographic evidence of malignancy. A result letter  of this screening mammogram will be  mailed directly to the patient. RECOMMENDATION: Screening mammogram in one year. (Code:SM-B-01Y) BI-RADS CATEGORY  1: Negative. Electronically Signed   By: Frederico Hamman M.D.   On: 06/27/2023 16:08    Microbiology: Results for orders placed or performed during the hospital encounter of 07/14/23  Surgical pcr screen     Status: None   Collection Time: 07/14/23 11:35 AM   Specimen: Nasal Mucosa; Nasal Swab  Result Value Ref Range Status   MRSA, PCR NEGATIVE NEGATIVE Final   Staphylococcus aureus NEGATIVE NEGATIVE Final    Comment: (NOTE) The Xpert SA Assay (FDA approved for NASAL specimens in patients 58 years of age and older), is one component of a comprehensive surveillance program. It is not intended to diagnose infection nor to guide or monitor treatment. Performed at [facility], [address] [place], [place] [address]     Labs:  CBC: No results for input(s): "WBC", "NEUTROABS", "HGB", "HCT", "MCV", "PLT" in the last 168 hours. Basic Metabolic Panel: No results for input(s): "NA", "K", "CL", "CO2", "GLUCOSE", "BUN", "CREATININE", "CALCIUM", "MG", "PHOS" in the last 168 hours. CBG: No results for input(s): "GLUCAP" in the last 168 hours.  Time spent: 35 minutes  Author: Lovenia Kim, MD  [facility]

## 2023-07-27 ENCOUNTER — Telehealth: Payer: Self-pay | Admitting: Neurosurgery

## 2023-07-27 DIAGNOSIS — M4316 Spondylolisthesis, lumbar region: Secondary | ICD-10-CM

## 2023-07-27 DIAGNOSIS — Z981 Arthrodesis status: Secondary | ICD-10-CM

## 2023-07-27 MED ORDER — OXYCODONE HCL 5 MG PO TABS: 5.0000 mg | ORAL_TABLET | ORAL | 0 refills | Status: DC | PRN

## 2023-07-27 MED ORDER — TIZANIDINE HCL 4 MG PO TABS: 4.0000 mg | ORAL_TABLET | Freq: Three times a day (TID) | ORAL | 0 refills | Status: DC

## 2023-07-27 NOTE — Telephone Encounter (Signed)
 I spoke with the patient. She reports she has incisional pain and back pain. Everything feels tight and stiff. She confirms her incision looks good and denies drainage or fever.   She is taking: Oxycodone  10mg  every 4 hours. Methocarbamol  every 6 hours - she is unsure if this helps, but it makes her sleepy.  She tried 650mg  tylenol  once, but it didn't help, but she hasn't tried taking it consistently. She is going to start taking 650mg  of tylenol  consistently (not to exceed the recommended daily dose).   Pharmacy: [facility] on [address] in [place]  Can she have an oxycodone  refill? She has 11 tabs left. I advised that I will discuss with one of the providers to see if there is something else she can change about her regimen.

## 2023-07-27 NOTE — Telephone Encounter (Signed)
 DOS 07/22/23  ALIF and PSF L4-L5   Recommend stopping the robaxin  and adding zanaflex  for muscle spasms.   Agree with tylneol, max of 3-4 grams per day.   Will sent refill of oxycodone  to pharmacy. PMP reviewed and is appropriate.

## 2023-07-27 NOTE — Telephone Encounter (Signed)
 I called Miss Fede and updated her on stacy's changes to her medications.   She indicates understanding that she should not exceed 4 grams daily with Tylenol .   Patient is thankful for assistance with changes in medications. She was advised to contact us  with any additional concerns.

## 2023-07-27 NOTE — Telephone Encounter (Signed)
 L4-5 XLIF/PSF on 07/22/23  She is taking 10mg  of oxy every 4 hours ever since she had the surgery. She is not able to get comfortable in any position. She has back and pain on her right side. Her incision looks good. She only has 11 pills let. What should she do about the pain?

## 2023-08-01 ENCOUNTER — Encounter: Payer: Self-pay | Admitting: Neurosurgery

## 2023-08-02 ENCOUNTER — Encounter: Payer: Self-pay | Admitting: Neurosurgery

## 2023-08-03 NOTE — Progress Notes (Unsigned)
   REFERRING PHYSICIAN:  Helane Rima, Do [address] [place],  [place] [address]  DOS: 07/22/23  XLIF L4-L5 with PSF on 07/22/23  DOS: 02/09/23 Right L4-L5 laminoforaminotomy and resection of synovial cyst   HISTORY OF PRESENT ILLNESS: Diana Schultz is approximately 2 weeks status post above surgery. Was given oxycodone and robaxin on discharge from the hospital.   She does not feel like her pain is controlled. She has constant LBP with bilateral leg pain mostly in her thighs. No new numbness, tingling, or weakness.   She is taking oxycodone 2 every 4 hours and zanaflex with minimal relief of pain.   PHYSICAL EXAMINATION:  General: Patient is well developed, well nourished, calm, collected, and in no apparent distress.   NEUROLOGICAL:  General: In no acute distress.   Awake, alert, oriented to person, place, and time.  Pupils equal round and reactive to light.  Facial tone is symmetric.     Strength:            Side Iliopsoas Quads Hamstring PF DF EHL  R 5 5 5 5 5 5   L 5 5 5 5 5 5    Incisions c/d/i   ROS (Neurologic):  Negative except as noted above  IMAGING: Nothing new to review.   ASSESSMENT/PLAN:  Diana Schultz is doing fair s/p above surgery. Pain is not currently well controlled. Treatment options reviewed with patient and following plan made:   - I have advised the patient to lift up to 10 pounds until 6 weeks after surgery (follow up with Dr. Myer Haff).  - Reviewed wound care.  - No bending, twisting, or lifting.  - Stop oxycodone and tizanidine.  - New prescription for dilaudid for pain. Reviewed dosing and side effects. PMP reviewed and is appropriate.  - She will go back to taking methacarbamol that she has at home for spasms. Reviewed dosing and side effects. Can make her sleepy.  - She has percocet prescription from prior to her surgery- she knows not to take this.  - Will message her on Monday to check on her progress.  - Follow up as  scheduled in 4 weeks and prn.   BP was elevated. No symptoms of chest pain, shortness of breath, blurry vision, or headaches. She will recheck at home and call PCP if not improved. If she develops CP, SOB, blurry vision, or headaches, then she will go to ED.     Advised to contact the office if any questions or concerns arise.  Drake Leach PA-C Department of neurosurgery

## 2023-08-04 ENCOUNTER — Encounter: Payer: Self-pay | Admitting: Orthopedic Surgery

## 2023-08-04 ENCOUNTER — Ambulatory Visit (INDEPENDENT_AMBULATORY_CARE_PROVIDER_SITE_OTHER): Payer: BC Managed Care – PPO | Admitting: Orthopedic Surgery

## 2023-08-04 VITALS — BP 144/98 | Temp 97.6°F

## 2023-08-04 DIAGNOSIS — M4316 Spondylolisthesis, lumbar region: Secondary | ICD-10-CM

## 2023-08-04 DIAGNOSIS — Z981 Arthrodesis status: Secondary | ICD-10-CM

## 2023-08-04 MED ORDER — HYDROMORPHONE HCL 2 MG PO TABS: 2.0000 mg | ORAL_TABLET | ORAL | 0 refills | Status: DC | PRN

## 2023-08-04 MED ORDER — METHYLPREDNISOLONE 4 MG PO TBPK: ORAL_TABLET | ORAL | 0 refills | Status: DC

## 2023-08-04 NOTE — Patient Instructions (Signed)
It was nice to see you today.   I am sorry you are not feeling better yet.   Okay to get incision wet in the shower, do not submerge in pool or hot tub.   Call if any concerns about the incision such as redness, drainage, or fever/chills.   Try to avoid bending, twisting, or lifting. You can lift up to 10 pounds until your follow up with Dr.  Myer Haff in 4 weeks.   Stop the oxycodone. I sent dilaudid to your pharmacy. Take as directed for severe pain. Try to take the least amount needed and only take for severe pain. Remember this medication can make you sleepy and/or constipated.   Stop the tizanidine. Restart the methocarbamol and take as needed for muscle spasms. This can make you sleepy.   I sent a steroid dose pack to the pharmacy. Take as directed.   Your blood pressure was elevated today. I want you to recheck it at home and follow up with your PCP if it remains high. If you have any chest pain, shortness of breath, blurry vision, or headaches then you need to go to ED.    We will see you back in 4 weeks for your 6 weeks postop visit. You will need xrays prior to this visit.   Please call with any questions or concerns.   Drake Leach PA-C [phone]     The physicians and staff at [facility] at [facility] are committed to providing excellent care. You may receive a survey asking for feedback about your experience at our office. We value you your feedback and appreciate you taking the time to to fill it out. The [facility] leadership team is also available to discuss your experience in person, feel free to contact us [phone].

## 2023-09-01 ENCOUNTER — Ambulatory Visit
Admission: RE | Admit: 2023-09-01 | Discharge: 2023-09-01 | Disposition: A | Discharge status: Home / Self Care | Attending: Neurosurgery | Admitting: Neurosurgery

## 2023-09-01 ENCOUNTER — Ambulatory Visit (INDEPENDENT_AMBULATORY_CARE_PROVIDER_SITE_OTHER): Payer: BC Managed Care – PPO | Admitting: Neurosurgery

## 2023-09-01 ENCOUNTER — Other Ambulatory Visit: Payer: Self-pay | Admitting: Family Medicine

## 2023-09-01 ENCOUNTER — Ambulatory Visit
Admission: RE | Admit: 2023-09-01 | Discharge: 2023-09-01 | Disposition: A | Discharge status: Home / Self Care | Source: Ambulatory Visit | Attending: Neurosurgery | Admitting: Neurosurgery

## 2023-09-01 VITALS — BP 130/82 | Ht 63.5 in | Wt 262.0 lb

## 2023-09-01 DIAGNOSIS — M4316 Spondylolisthesis, lumbar region: Secondary | ICD-10-CM | POA: Diagnosis not present

## 2023-09-01 DIAGNOSIS — Z981 Arthrodesis status: Secondary | ICD-10-CM

## 2023-09-01 DIAGNOSIS — M47815 Spondylosis without myelopathy or radiculopathy, thoracolumbar region: Secondary | ICD-10-CM | POA: Diagnosis not present

## 2023-09-01 NOTE — Progress Notes (Signed)
   REFERRING PHYSICIAN:  Helane Rima, Do [address] [place],  [place] [address]  DOS: 07/22/23  XLIF L4-L5 with PSF on 07/22/23  DOS: 02/09/23 Right L4-L5 laminoforaminotomy and resection of synovial cyst   HISTORY OF PRESENT ILLNESS: Diana Schultz is status post above surgery.  She is doing very well.  Her pain is down to 1 out of 10.  She is very happy.     PHYSICAL EXAMINATION:  General: Patient is well developed, well nourished, calm, collected, and in no apparent distress.   NEUROLOGICAL:  General: In no acute distress.   Awake, alert, oriented to person, place, and time.  Pupils equal round and reactive to light.  Facial tone is symmetric.     Strength:            Side Iliopsoas Quads Hamstring PF DF EHL  R 5 5 5 5 5 5   L 5 5 5 5 5 5    Incisions c/d/i   ROS (Neurologic):  Negative except as noted above  IMAGING: No complications noted  ASSESSMENT/PLAN:  Diana Schultz is doing better s/p above surgery.  She has made major improvements.  I am very happy with her improvements.  We discussed increasing her activity.  She will return to work next week.  We will see her back in 6 weeks.   Venetia Night MD Department of neurosurgery

## 2023-09-05 DIAGNOSIS — G4733 Obstructive sleep apnea (adult) (pediatric): Secondary | ICD-10-CM | POA: Diagnosis not present

## 2023-09-05 DIAGNOSIS — Z9189 Other specified personal risk factors, not elsewhere classified: Secondary | ICD-10-CM | POA: Diagnosis not present

## 2023-09-05 DIAGNOSIS — I152 Hypertension secondary to endocrine disorders: Secondary | ICD-10-CM | POA: Diagnosis not present

## 2023-09-05 DIAGNOSIS — E1169 Type 2 diabetes mellitus with other specified complication: Secondary | ICD-10-CM | POA: Diagnosis not present

## 2023-10-11 ENCOUNTER — Encounter: Payer: BC Managed Care – PPO | Admitting: Orthopedic Surgery

## 2023-10-12 ENCOUNTER — Other Ambulatory Visit: Payer: Self-pay | Admitting: Family Medicine

## 2023-10-12 DIAGNOSIS — M4316 Spondylolisthesis, lumbar region: Secondary | ICD-10-CM

## 2023-10-13 ENCOUNTER — Ambulatory Visit (INDEPENDENT_AMBULATORY_CARE_PROVIDER_SITE_OTHER): Payer: BC Managed Care – PPO | Admitting: Neurosurgery

## 2023-10-13 ENCOUNTER — Ambulatory Visit
Admission: RE | Admit: 2023-10-13 | Discharge: 2023-10-13 | Disposition: A | Discharge status: Home / Self Care | Payer: Self-pay | Attending: Neurosurgery | Admitting: Neurosurgery

## 2023-10-13 ENCOUNTER — Ambulatory Visit
Admission: RE | Admit: 2023-10-13 | Discharge: 2023-10-13 | Disposition: A | Discharge status: Home / Self Care | Payer: Self-pay | Source: Ambulatory Visit | Attending: Neurosurgery | Admitting: Neurosurgery

## 2023-10-13 ENCOUNTER — Encounter: Payer: Self-pay | Admitting: Neurosurgery

## 2023-10-13 VITALS — BP 128/78 | Ht 63.5 in | Wt 262.0 lb

## 2023-10-13 DIAGNOSIS — Z981 Arthrodesis status: Secondary | ICD-10-CM

## 2023-10-13 DIAGNOSIS — M4316 Spondylolisthesis, lumbar region: Secondary | ICD-10-CM

## 2023-10-13 NOTE — Progress Notes (Signed)
   REFERRING PHYSICIAN:  Jonn Nett, Do [address] [place],  [place] [address]  DOS: 07/22/23  XLIF L4-L5 with PSF on 07/22/23  DOS: 02/09/23 Right L4-L5 laminoforaminotomy and resection of synovial cyst   HISTORY OF PRESENT ILLNESS:  10/13/23 Diana Schultz is about 3 months s/p XLIF and PSF. She is doing very well and is very pleased with her postoperative outcome.  She states she does have some intermittent discomfort in her low back from time to time but this is largely manageable.  She denies any leg pain.  09/11/23 Diana Schultz is status post above surgery.  She is doing very well.  Her pain is down to 1 out of 10.  She is very happy.     PHYSICAL EXAMINATION:  General: Patient is well developed, well nourished, calm, collected, and in no apparent distress.   NEUROLOGICAL:  General: In no acute distress.   Awake, alert, oriented to person, place, and time. Strength:            Side Iliopsoas Quads Hamstring PF DF EHL  R 5 5 5 5 5 5   L 5 5 5 5 5 5    Incisions well healed   ROS (Neurologic):  Negative except as noted above  IMAGING: No complications noted  ASSESSMENT/PLAN:  Diana Schultz is doing better s/p above surgery.  She may resume activities as tolerated. She will return to clinic in 6 months to see Dr. Mont Antis with lumbar xrays prior or sooner should she have any questions or concerns.    Anastacio Karvonen PA-C Department of neurosurgery

## 2023-11-02 ENCOUNTER — Ambulatory Visit: Payer: BC Managed Care – PPO | Admitting: Family Medicine

## 2023-12-13 ENCOUNTER — Encounter: Payer: Self-pay | Admitting: *Deleted

## 2024-04-12 ENCOUNTER — Other Ambulatory Visit: Payer: Self-pay

## 2024-04-12 DIAGNOSIS — M4316 Spondylolisthesis, lumbar region: Secondary | ICD-10-CM

## 2024-04-12 DIAGNOSIS — M5416 Radiculopathy, lumbar region: Secondary | ICD-10-CM

## 2024-04-17 ENCOUNTER — Ambulatory Visit: Payer: Self-pay | Admitting: Neurosurgery

## 2024-04-17 ENCOUNTER — Other Ambulatory Visit: Payer: Self-pay

## 2024-05-25 ENCOUNTER — Encounter: Payer: Self-pay | Admitting: Family Medicine

## 2024-05-25 ENCOUNTER — Ambulatory Visit (INDEPENDENT_AMBULATORY_CARE_PROVIDER_SITE_OTHER): Payer: Self-pay | Admitting: Family Medicine

## 2024-05-25 VITALS — BP 130/82 | HR 75 | Ht 63.5 in | Wt 276.4 lb

## 2024-05-25 DIAGNOSIS — E119 Type 2 diabetes mellitus without complications: Secondary | ICD-10-CM

## 2024-05-25 DIAGNOSIS — G4733 Obstructive sleep apnea (adult) (pediatric): Secondary | ICD-10-CM

## 2024-05-25 DIAGNOSIS — E1169 Type 2 diabetes mellitus with other specified complication: Secondary | ICD-10-CM

## 2024-05-25 DIAGNOSIS — E66813 Obesity, class 3: Secondary | ICD-10-CM

## 2024-05-25 DIAGNOSIS — Z981 Arthrodesis status: Secondary | ICD-10-CM

## 2024-05-25 DIAGNOSIS — I150 Renovascular hypertension: Secondary | ICD-10-CM

## 2024-05-25 LAB — POCT GLYCOSYLATED HEMOGLOBIN (HGB A1C): Hemoglobin A1C: 5.5 % (ref 4.0–5.6)

## 2024-05-25 MED ORDER — TIRZEPATIDE-WEIGHT MANAGEMENT 2.5 MG/0.5ML ~~LOC~~ SOLN: 2.5000 mg | SUBCUTANEOUS | 1 refills | Status: AC

## 2024-05-27 MED ORDER — PREDNISONE 5 MG PO TABS: ORAL_TABLET | ORAL | 0 refills | Status: AC

## 2024-05-27 NOTE — Progress Notes (Signed)
 1. Obstructive sleep apnea syndrome   2. Renovascular hypertension   3. Class 3 severe obesity with serious comorbidity and body mass index (BMI) of 45.0 to 49.9 in adult, unspecified obesity type (HCC)   4. Type 2 diabetes mellitus without complication, without long-term current use of insulin  (HCC)    Meds ordered this encounter  Medications   tirzepatide  (ZEPBOUND ) 2.5 MG/0.5ML injection vial    Sig: Inject 2.5 mg into the skin once a week.    Dispense:  2 mL    Refill:  1   predniSONE  (DELTASONE ) 5 MG tablet    Sig: 6-5-4-3-2-1-OFF    Dispense:  21 tablet    Refill:  0   Assessment and Plan Assessment & Plan Obesity and weight management Obesity with weight reduction from 311 lbs to 276 lbs. Weight management hindered by insurance and financial issues. Mounjaro  effective but discontinued; Zepbound  considered as alternative. - Start Zepbound  2.5 mg at [employer]. - Focus on dietary changes, specifically reducing sweet tea intake.  Lipedema Significant leg discomfort and swelling due to lipedema, exacerbated by weight gain and prolonged sitting or standing. Pain likely from lipedema, edema, and possible nerve involvement. - Use a pillow for support during driving. - Consider weight loss to reduce symptoms.  Chronic back pain with history of spinal surgery Chronic back pain with history of spinal surgery. Pain exacerbated by weight gain and possible nerve involvement. Previous surgery involved rods and screws requiring monitoring. - Follow up with neurosurgeon to check stability of spinal rods and screws. - Consider muscle relaxers for pain management. - Consider steroids for inflammation and pain relief.  Type 2 diabetes mellitus Type 2 diabetes previously well-controlled on Mounjaro , likely uncontrolled now due to medication discontinuation. Blood sugar not monitored due to lack of insurance. - Restart Mounjaro  or Zepbound  for diabetes management.  Hypertension  with DM Hypertension well-controlled except during pain and stress, causing blood pressure spikes. - Manage pain to help control blood pressure.  Obstructive sleep apnea Previously managed with CPAP, discontinued due to insurance issues. CPAP supplies available for short-term use. - Use CPAP supplies until insurance is active.  Insomnia Managed with magnesium  for sleep. - Continue magnesium  for sleep support.  Osteoarthritis Contributing to joint pain, particularly in the back. - Manage pain with muscle relaxers as needed.  Iron  deficiency anemia, history of History of iron  deficiency anemia with previous iron  infusion. Current iron  levels unknown due to lack of recent labs. - Order labs to assess current iron  levels once insurance is active.   Geni Shutter, DO, MS, FAAFP, Dipl. [facility] [address] [place], [address] Dept: [phone] Dept Fax: [phone]  Subjective:   Patient is well-known to me from previous care setting and is establishing care in this system with me as PCP. Prior records reviewed when available. Chart updated today with reconciliation of problem list, medications, allergies, and relevant history. Preventive care and chronic disease status reviewed. Portions of historical chart may remain incomplete; will update on an ongoing basis as clinically indicated.  Discussed the use of AI scribe software for clinical note transcription with the patient, who gave verbal consent to proceed.  History of Present Illness Diana Schultz is a 58 year old female with lipedema and back pain who presents with worsening leg pain and swelling.  Lower extremity pain and edema - Significant pain and swelling in the legs, particularly the left leg - Swelling is pronounced  after prolonged sitting or driving - Family history of lipedema  Back pain - Back pain present for the past few days - Pain is localized without  radiation - History of back surgery - Concern for possible hardware movement  Diabetes mellitus - Currently not on any medications due to insurance issues - Previously used Mounjaro  for glycemic control  Hypertension - Blood pressure fluctuates with pain and stress - History of high blood pressure readings  Obstructive sleep apnea - Currently not using CPAP due to insurance issues  Iron  deficiency - Received iron  infusion approximately one year ago - Concern about current iron  levels  Obesity - Obesity is a chronic comorbidity  Psychosocial stressors - Caring for mother with dementia - Personal stressors impacting ability to focus on health  Review of Systems: Negative, with the exception of above mentioned in HPI.  Current Outpatient Medications:    acetaminophen  (TYLENOL ) 500 MG tablet, Take 1,000 mg by mouth 2 (two) times daily., Disp: , Rfl:    MAGNESIUM  GLYCINATE PO, Take 1 tablet by mouth at bedtime., Disp: , Rfl:    predniSONE  (DELTASONE ) 5 MG tablet, 6-5-4-3-2-1-OFF, Disp: 21 tablet, Rfl: 0   tirzepatide  (ZEPBOUND ) 2.5 MG/0.5ML injection vial, Inject 2.5 mg into the skin once a week., Disp: 2 mL, Rfl: 1   Objective:   BP 130/82 (BP Location: Right Arm, Cuff Size: Normal)   Pulse 75   Ht 5' 3.5 (1.613 m)   Wt 276 lb 6.4 oz (125.4 kg)   LMP 12/01/2013 Comment: hx ablation 03/23/2013  SpO2 98%   BMI 48.19 kg/m   Physical Exam Constitutional:      Comments: Uncomfortable-appearing, difficulty standing from seated position due to pain.  Cardiovascular:     Rate and Rhythm: Normal rate and regular rhythm.  Pulmonary:     Effort: Pulmonary effort is normal.     Breath sounds: Normal breath sounds.    Lab Results  Component Value Date   CREATININE 0.73 07/14/2023   BUN 22 (H) 07/14/2023   NA 138 07/14/2023   K 3.9 07/14/2023   CL 105 07/14/2023   CO2 26 07/14/2023   Lab Results  Component Value Date   ALT 24 03/18/2022   AST 27 03/18/2022   ALKPHOS  66 03/18/2022   BILITOT 0.9 03/18/2022   Lab Results  Component Value Date   HGBA1C 5.5 05/25/2024   HGBA1C 5.3 07/14/2023   HGBA1C 6.0 (H) 09/11/2019   HGBA1C 6.0 03/08/2016   HGBA1C 5.9 08/18/2015   Lab Results  Component Value Date   INSULIN  26.1 (H) 09/11/2019   Lab Results  Component Value Date   TSH 2.140 09/11/2019   Lab Results  Component Value Date   CHOL 158 09/11/2019   HDL 58 09/11/2019   LDLCALC 84 09/11/2019   TRIG 87 09/11/2019   CHOLHDL 2.7 09/11/2019   Lab Results  Component Value Date   VD25OH 25.7 (L) 09/11/2019   Lab Results  Component Value Date   WBC 7.1 07/14/2023   HGB 13.8 07/14/2023   HCT 42.6 07/14/2023   MCV 80.2 07/14/2023   PLT 215 07/14/2023   Lab Results  Component Value Date   IRON  48 09/11/2019   TIBC 282 09/11/2019   FERRITIN 363 (H) 09/11/2019

## 2024-05-28 NOTE — Telephone Encounter (Signed)
 Pt stopped by the office and spoke with Dr Prentiss. This was regarding a different pt that is in Olene's care. Closing encounter

## 2024-07-06 ENCOUNTER — Ambulatory Visit: Payer: Self-pay | Admitting: Family Medicine

## 2024-08-20 ENCOUNTER — Ambulatory Visit: Payer: Self-pay | Admitting: Family Medicine

## 2024-08-21 ENCOUNTER — Ambulatory Visit: Payer: Self-pay | Admitting: Neurosurgery

## 2024-11-06 ENCOUNTER — Ambulatory Visit: Admitting: Neurology
